# Patient Record
Sex: Female | Born: 1990 | Race: White | Hispanic: No | Marital: Married | Smoking: Former smoker
Health system: Southern US, Community
[De-identification: ages and names within clinical notes are randomized; demographics above are authoritative.]

## PROBLEM LIST (undated history)

## (undated) DIAGNOSIS — M359 Systemic involvement of connective tissue, unspecified: Secondary | ICD-10-CM

## (undated) DIAGNOSIS — F32A Depression, unspecified: Secondary | ICD-10-CM

## (undated) DIAGNOSIS — M1990 Unspecified osteoarthritis, unspecified site: Secondary | ICD-10-CM

## (undated) DIAGNOSIS — K529 Noninfective gastroenteritis and colitis, unspecified: Secondary | ICD-10-CM

## (undated) DIAGNOSIS — F329 Major depressive disorder, single episode, unspecified: Secondary | ICD-10-CM

## (undated) DIAGNOSIS — M797 Fibromyalgia: Secondary | ICD-10-CM

## (undated) HISTORY — DX: Noninfective gastroenteritis and colitis, unspecified: K52.9

## (undated) HISTORY — DX: Depression, unspecified: F32.A

## (undated) HISTORY — DX: Systemic involvement of connective tissue, unspecified: M35.9

## (undated) HISTORY — PX: OVARY SURGERY: SHX727

## (undated) HISTORY — DX: Fibromyalgia: M79.7

---
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
## 2011-12-25 LAB — HM PAP SMEAR: HM Pap smear: NEGATIVE

## 2012-05-25 ENCOUNTER — Ambulatory Visit: Payer: Self-pay | Admitting: Obstetrics & Gynecology

## 2012-05-25 LAB — CBC
HCT: 40.2 % (ref 35.0–47.0)
HGB: 13.2 g/dL (ref 12.0–16.0)
MCH: 28.7 pg (ref 26.0–34.0)
MCHC: 32.9 g/dL (ref 32.0–36.0)
MCV: 87 fL (ref 80–100)
Platelet: 302 10*3/uL (ref 150–440)
RBC: 4.61 10*6/uL (ref 3.80–5.20)
RDW: 12.3 % (ref 11.5–14.5)
WBC: 5.7 10*3/uL (ref 3.6–11.0)

## 2012-06-02 ENCOUNTER — Ambulatory Visit: Payer: Self-pay | Admitting: Obstetrics & Gynecology

## 2012-06-03 LAB — PATHOLOGY REPORT

## 2014-07-20 NOTE — Op Note (Signed)
PATIENT NAME:  Stephanie Ayala, Stephanie Ayala MR#:  [id number] DATE OF BIRTH:  11-06-1990  DATE OF PROCEDURE:  06/02/2012  PREOPERATIVE DIAGNOSIS: Pelvic pain, right lower quadrant pain, and right ovarian cyst.   POSTOPERATIVE DIAGNOSIS: Pelvic pain, right lower quadrant pain, and right ovarian cyst.   PROCEDURE: Operative laparoscopy, with right ovarian cystectomy.   SURGEON: Leticia Coletta.   ANESTHESIA: General.   BLOOD LOSS: Minimal.   COMPLICATIONS: None.   FINDINGS: Right small ovarian cyst, normal left ovary tubes, ovaries, uterus, appendix, bowel. No signs of endometriosis or adhesions.   DISPOSITION: To the recovery room in stable condition.   TECHNIQUE: The patient was prepped and draped in the usual sterile fashion after adequate anesthesia was obtained in the dorsal lithotomy position. Bladder was drained with a Robinson catheter. A sponge stick was placed per vagina for manipulation purposes.   Attention was then turned to the abdomen where a Veress needle was inserted through a 5-mm infraumbilical incision after Marcaine was used to anesthetize the skin. Veress needle placement is confirmed using the hanging-drop technique and the abdomen was then insufflated with CO2 gas. A 5-mm trocar was then inserted under direct visualization with the laparoscope, with no injuries or bleeding noted. The patient was placed in Trendelenburg position. The above-mentioned findings are visualized. Five millimeter trocars were placed in the suprapubic region in the left lower quadrant lateral to the inferior epigastric blood vessel, with no injuries or bleeding noted. The right ovary was isolated and cyst identified. It was lysed with the 5-mm Harmonic scalpel, with aspiration of fluid. The cyst wall was grasped with grasping forceps and a few pieces were removed without bleeding noted.   Pelvic cavity was irrigated, with aspiration of all fluid. Excellent hemostasis was noted. There was no apparent injury to bowel,  bladder, ureter or other structures. Gas was expelled as the patient was leveled. Trocars are removed, and Dermabond was used to close the skin. Sponge stick was removed. The patient was taken to the recovery room in stable condition. All sponge, instrument, and needle counts were correct.    ____________________________ R. Barnett Applebaum, MD rph:dm D: 06/02/2012 10:56:00 ET T: 06/02/2012 11:15:07 ET JOB#: [id number]  cc: Glean Salen, MD, <Dictator> Gae Dry MD ELECTRONICALLY SIGNED 06/03/2012 7:42

## 2015-03-20 ENCOUNTER — Encounter: Payer: Self-pay | Admitting: *Deleted

## 2015-03-20 ENCOUNTER — Ambulatory Visit
Admission: EM | Admit: 2015-03-20 | Discharge: 2015-03-20 | Disposition: A | Discharge status: Home / Self Care | Payer: 59 | Attending: Family Medicine | Admitting: Family Medicine

## 2015-03-20 DIAGNOSIS — J069 Acute upper respiratory infection, unspecified: Secondary | ICD-10-CM | POA: Diagnosis not present

## 2015-03-20 LAB — URINALYSIS COMPLETE WITH MICROSCOPIC (ARMC ONLY)
Bilirubin Urine: NEGATIVE
Glucose, UA: NEGATIVE mg/dL
Leukocytes, UA: NEGATIVE
Nitrite: NEGATIVE
Protein, ur: NEGATIVE mg/dL
Specific Gravity, Urine: 1.025 (ref 1.005–1.030)
pH: 5.5 (ref 5.0–8.0)

## 2015-03-20 LAB — PREGNANCY, URINE: Preg Test, Ur: NEGATIVE

## 2015-03-20 MED ORDER — AZITHROMYCIN 250 MG PO TABS: ORAL_TABLET | ORAL | Status: DC

## 2015-03-20 NOTE — ED Provider Notes (Addendum)
Patient presents today with symptoms of myalgias, nausea, subjective fever the last few days. She has had a mild headache as well. She denies any photophobia, neck stiffness, chest pain, shortness of breath. She has had minimal sore throat and mild productive cough. She denies any abdominal pain, vomiting, diarrhea, urinary symptoms.  ROS: Negative except mentioned above.  Vitals as per Epic  GENERAL: NAD HEENT: mild to moderate pharyngeal erythema, no exudate, no erythema of TMs, no cervical LAD RESP: CTA B CARD: RRR ABD: +BS, NT/ND, no guarding or rebound, no flank tenderness NEURO: CN II-XII grossly intact, negative meningeal symptoms   A/P: URI- discussed likely viral source, discussed with patient that if her sore throat and/or cough symptoms persist she can fill the Z-Pak and start taking it. Discussed UA results. Urine pregnancy negative. Encourage patient stay hydrated, rest. Tylenol when necessary. Encouraged seeking medical attention if symptoms persist or worsen as discussed.  Paulina Fusi, MD 03/20/15 2458  Paulina Fusi, MD 03/20/15 [phone]

## 2015-03-20 NOTE — ED Notes (Signed)
Patient reports the headache symptom started one week ago with body aches starting 3 days ago. Additional symptoms of fever and nausea without vomiting also reported. Patient has been working out a the gym for 6 months.

## 2015-03-26 ENCOUNTER — Ambulatory Visit
Admission: EM | Admit: 2015-03-26 | Discharge: 2015-03-26 | Disposition: A | Discharge status: Home / Self Care | Payer: 59 | Attending: Family Medicine | Admitting: Family Medicine

## 2015-03-26 DIAGNOSIS — R110 Nausea: Secondary | ICD-10-CM | POA: Diagnosis not present

## 2015-03-26 DIAGNOSIS — N390 Urinary tract infection, site not specified: Secondary | ICD-10-CM

## 2015-03-26 LAB — URINALYSIS COMPLETE WITH MICROSCOPIC (ARMC ONLY)
Glucose, UA: NEGATIVE mg/dL
Hgb urine dipstick: NEGATIVE
Leukocytes, UA: NEGATIVE
Nitrite: NEGATIVE
Protein, ur: NEGATIVE mg/dL
Specific Gravity, Urine: 1.02 (ref 1.005–1.030)
pH: 5.5 (ref 5.0–8.0)

## 2015-03-26 LAB — PREGNANCY, URINE: Preg Test, Ur: NEGATIVE

## 2015-03-26 MED ORDER — CIPROFLOXACIN HCL 500 MG PO TABS: 500.0000 mg | ORAL_TABLET | Freq: Two times a day (BID) | ORAL | Status: DC

## 2015-03-26 MED ORDER — ONDANSETRON HCL 4 MG PO TABS: 4.0000 mg | ORAL_TABLET | Freq: Four times a day (QID) | ORAL | Status: DC

## 2015-03-26 MED ORDER — ONDANSETRON 8 MG PO TBDP
8.0000 mg | ORAL_TABLET | Freq: Once | ORAL | Status: AC
  Administered 2015-03-26: 8 mg via ORAL

## 2015-03-26 NOTE — ED Notes (Signed)
Pt reports able to drink but feels nausea if eats , like the food is going to come back up. Pt reports dizziness on standing , unable to go to gym. Pt also reports headaches,at base of head. Has been taking tylenol and switching with advil. Also having diarrhea, since Wed. But not every day, same day she started zpac, but not having diarrhea today.

## 2015-03-26 NOTE — Discharge Instructions (Signed)
For fever or discomfort-- Tylenol 2 tablets or Ibuprofen/advil  2 tablets every 4-6 hours as needed  Bacteria in urine---Cipro 1 tablet twice daily for 7 days..call On day 3 for culture results  Zofran 4 mg 1  Up to three times daily for  Nausea  Avoid greasy and high spice foods--- try BRAT   bananas rice applesauce toast     Fluids are more important         Viral syndrome couch -- Zpak works for [employer] tract for about 42 days  Nausea and Vomiting Nausea is a sick feeling that often comes before throwing up (vomiting). Vomiting is a reflex where stomach contents come out of your mouth. Vomiting can cause severe loss of body fluids (dehydration). Children and elderly adults can become dehydrated quickly, especially if they also have diarrhea. Nausea and vomiting are symptoms of a condition or disease. It is important to find the cause of your symptoms. CAUSES   Direct irritation of the stomach lining. This irritation can result from increased acid production (gastroesophageal reflux disease), infection, food poisoning, taking certain medicines (such as nonsteroidal anti-inflammatory drugs), alcohol use, or tobacco use.  Signals from the brain.These signals could be caused by a headache, heat exposure, an inner ear disturbance, increased pressure in the brain from injury, infection, a tumor, or a concussion, pain, emotional stimulus, or metabolic problems.  An obstruction in the gastrointestinal tract (bowel obstruction).  Illnesses such as diabetes, hepatitis, gallbladder problems, appendicitis, kidney problems, cancer, sepsis, atypical symptoms of a heart attack, or eating disorders.  Medical treatments such as chemotherapy and radiation.  Receiving medicine that makes you sleep (general anesthetic) during surgery. DIAGNOSIS Your caregiver may ask for tests to be done if the problems do not improve after a few days. Tests may also be done if symptoms are severe or if the  reason for the nausea and vomiting is not clear. Tests may include:  Urine tests.  Blood tests.  Stool tests.  Cultures (to look for evidence of infection).  X-rays or other imaging studies. Test results can help your caregiver make decisions about treatment or the need for additional tests. TREATMENT You need to stay well hydrated. Drink frequently but in small amounts.You may wish to drink water, sports drinks, clear broth, or eat frozen ice pops or gelatin dessert to help stay hydrated.When you eat, eating slowly may help prevent nausea.There are also some antinausea medicines that may help prevent nausea. HOME CARE INSTRUCTIONS   Take all medicine as directed by your caregiver.  If you do not have an appetite, do not force yourself to eat. However, you must continue to drink fluids.  If you have an appetite, eat a normal diet unless your caregiver tells you differently.  Eat a variety of complex carbohydrates (rice, wheat, potatoes, bread), lean meats, yogurt, fruits, and vegetables.  Avoid high-fat foods because they are more difficult to digest.  Drink enough water and fluids to keep your urine clear or pale yellow.  If you are dehydrated, ask your caregiver for specific rehydration instructions. Signs of dehydration may include:  Severe thirst.  Dry lips and mouth.  Dizziness.  Dark urine.  Decreasing urine frequency and amount.  Confusion.  Rapid breathing or pulse. SEEK IMMEDIATE MEDICAL CARE IF:   You have blood or brown flecks (like coffee grounds) in your vomit.  You have black or bloody stools.  You have a severe headache or stiff neck.  You are confused.  You have severe  abdominal pain.  You have chest pain or trouble breathing.  You do not urinate at least once every 8 hours.  You develop cold or clammy skin.  You continue to vomit for longer than 24 to 48 hours.  You have a fever. MAKE SURE YOU:   Understand these  instructions.  Will watch your condition.  Will get help right away if you are not doing well or get worse.   This information is not intended to replace advice given to you by your health care provider. Make sure you discuss any questions you have with your health care provider.   Document Released: 03/16/2005 Document Revised: 06/08/2011 Document Reviewed: 08/13/2010 Elsevier Interactive Patient Education [employer].

## 2015-03-26 NOTE — ED Notes (Signed)
Denies fever or headache on assessment

## 2015-03-26 NOTE — ED Notes (Addendum)
C/o posterior neck pain/"headache" x 3-4 weeks. Also states has a fever "every night" no higher than 100.3 every night. + nausea when atempts to eat. Seen here at [facility] this past Wednesday and told "don't know what's wrong"

## 2015-03-26 NOTE — ED Provider Notes (Signed)
CSN: [id number]     Arrival date & time 03/26/15  1146 History   First MD Initiated Contact with Patient 03/26/15 1500     Chief Complaint  Patient presents with  . Fever   (Consider location/radiation/quality/duration/timing/severity/associated sxs/prior Treatment) HPI   23 yo F presents concerned because she had a 100 degree temp in the evening. She Reports her usual temp around 97. Stayed out of work 2 days last week, then seen here 12/21. 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 She opted to start Zpak on 12/22- has completed Reports mild nausea, but tolerates food - choices today were to eat potato soup and potato chips. Well tolerated. Has not vomited. No fever . Denies frequency or dysuria Has intermittent occipital headache No hx of seasonal allergies Wearing Mirena - no menses. Hx of ovarian cysts-has had surgery x 2 by her report  History reviewed. No pertinent past medical history. Past Surgical History  Procedure Laterality Date  . Ovary surgery Bilateral     cyst removal   Family History  Problem Relation Age of Onset  . Diabetes Father    Social History  Substance Use Topics  . Smoking status: Current Every Day Smoker -- 0.50 packs/day    Types: Cigarettes  . Smokeless tobacco: Never Used  . Alcohol Use: Yes   OB History    No data available    Hx of ovarian cysts required surgery - managed on Mirena   Review of Systems 10 Systems reviewed and are negative for acute change except as noted in the HPI.  Allergies  Review of patient's allergies indicates no known allergies.  Home Medications   Prior to Admission medications   Medication Sig Start Date End Date Taking? Authorizing Provider  levonorgestrel (MIRENA) 20 MCG/24HR IUD 1 each by Intrauterine route once.   Yes Historical Provider,  MD  azithromycin (ZITHROMAX Z-PAK) 250 MG tablet Use as directed on box. 03/20/15   Paulina Fusi, MD  ciprofloxacin (CIPRO) 500 MG tablet Take 1 tablet (500 mg total) by mouth 2 (two) times daily. 03/26/15   Jan Fireman, PA-C  Multiple Vitamins-Minerals (MULTIVITAMIN WITH MINERALS) tablet Take 1 tablet by mouth daily.    Historical Provider, MD  ondansetron (ZOFRAN) 4 MG tablet Take 1 tablet (4 mg total) by mouth every 6 (six) hours. 03/26/15   Jan Fireman, PA-C   Meds Ordered and Administered this Visit   Medications  ondansetron (ZOFRAN-ODT) disintegrating tablet 8 mg (8 mg Oral Given 03/26/15 1512)    Temp(Src) 97.5 F (36.4 C) (Tympanic) No data found.   Physical Exam   Temp 97.5  96-16-117/64  Sp02 100% General: NAD, does not appear toxic- alert and interactive, denies any pain, skin well hydrated  HEENT:normocephalic,atraumatic, mucous membranes moist,grossly normal hearing Eyes: EOMI, conjunctiva clear, conjugate gaze Neck: supple,no lymphadenopathy Resp : CT A, bilat; normal respiratory effort Card : RRR Abd:  Not distended, soft , normal BS, non-tender Back : no CVAT, no back pain Skin: no rash, skin intact MSK: no deformities, ambulatory without assistance Neuro : good attention,recall-good memory, no focal neuro deficits Psych: speech and behavior appropriate   ED Course  Procedures (including critical care time)  Labs Review Labs Reviewed  URINALYSIS COMPLETEWITH MICROSCOPIC ([facility] ONLY) - Abnormal; Notable for the following:  Bilirubin Urine 1+ (*)    Ketones, ur TRACE (*)    Bacteria, UA MANY (*)    Squamous Epithelial / LPF 0-5 (*)    All other components within normal limits  PREGNANCY, URINE   Results for orders placed or performed during the hospital encounter of 03/26/15  Urinalysis complete, with microscopic  Result Value Ref Range   Color, Urine YELLOW YELLOW   APPearance CLEAR CLEAR   Glucose, UA NEGATIVE NEGATIVE mg/dL   Bilirubin Urine  1+ (A) NEGATIVE   Ketones, ur TRACE (A) NEGATIVE mg/dL   Specific Gravity, Urine 1.020 1.005 - 1.030   Hgb urine dipstick NEGATIVE NEGATIVE   pH 5.5 5.0 - 8.0   Protein, ur NEGATIVE NEGATIVE mg/dL   Nitrite NEGATIVE NEGATIVE   Leukocytes, UA NEGATIVE NEGATIVE   RBC / HPF 0-5 0 - 5 RBC/hpf   WBC, UA 0-5 0 - 5 WBC/hpf   Bacteria, UA MANY (A) NONE SEEN   Squamous Epithelial / LPF 0-5 (A) NONE SEEN   Mucous PRESENT   Pregnancy, urine  Result Value Ref Range   Preg Test, Ur NEGATIVE NEGATIVE     Imaging Review No results found.  Was given  Zofran 8 mg on arrival-- denied discomfort. Discussed long term effectiveness of Zpak despite short therapy time   MDM   1. UTI (lower urinary tract infection)   2. Nausea    Plan: Test/x-ray results and diagnosis reviewed with patient- viral syndrome also to be considered UTI vs contaminated specimen - will Rx - missed work, mild temp elevation, trace ketones and nausea; preg test neg Call 3 days for culture results- Cipro x 7 days Encourage PO fluids- tylenol/ibuprofen-  Given Zofran 4 mg- trace ketones noted- encourage PO liquids Rx as per orders;  benefits, risks, potential side effects reviewed   Recommend supportive treatment  Questions fielded, expectations and recommendations reviewed.Discussed follow up and return parameters including no resolution or any worsening condition. . Patient expresses understanding  and agrees to plan. Will return to [facility] with questions, concerns or exacerbation.   Discharge Medication List as of 03/26/2015  4:28 PM    START taking these medications   Details  ciprofloxacin (CIPRO) 500 MG tablet Take 1 tablet (500 mg total) by mouth 2 (two) times daily., Starting 03/26/2015, Until Discontinued, Normal    ondansetron (ZOFRAN) 4 MG tablet Take 1 tablet (4 mg total) by mouth every 6 (six) hours., Starting 03/26/2015, Until Discontinued, Normal        Jan Fireman, PA-C 03/28/15 1532

## 2015-03-26 NOTE — ED Notes (Signed)
Pt given gingerale and graham crackers to try eating after zofran.

## 2015-03-28 ENCOUNTER — Encounter: Payer: Self-pay | Admitting: Physician Assistant

## 2015-03-29 ENCOUNTER — Emergency Department
Admission: EM | Admit: 2015-03-29 | Discharge: 2015-03-29 | Disposition: A | Discharge status: Home / Self Care | Payer: 59 | Attending: Emergency Medicine | Admitting: Emergency Medicine

## 2015-03-29 ENCOUNTER — Encounter: Payer: Self-pay | Admitting: Medical Oncology

## 2015-03-29 DIAGNOSIS — F17210 Nicotine dependence, cigarettes, uncomplicated: Secondary | ICD-10-CM | POA: Diagnosis not present

## 2015-03-29 DIAGNOSIS — B2790 Infectious mononucleosis, unspecified without complication: Secondary | ICD-10-CM | POA: Insufficient documentation

## 2015-03-29 DIAGNOSIS — Z79899 Other long term (current) drug therapy: Secondary | ICD-10-CM | POA: Diagnosis not present

## 2015-03-29 DIAGNOSIS — R509 Fever, unspecified: Secondary | ICD-10-CM | POA: Diagnosis present

## 2015-03-29 LAB — COMPREHENSIVE METABOLIC PANEL
ALT: 116 U/L — ABNORMAL HIGH (ref 14–54)
AST: 134 U/L — ABNORMAL HIGH (ref 15–41)
Albumin: 3.5 g/dL (ref 3.5–5.0)
Alkaline Phosphatase: 86 U/L (ref 38–126)
Anion gap: 6 (ref 5–15)
BUN: 7 mg/dL (ref 6–20)
CO2: 26 mmol/L (ref 22–32)
Calcium: 8.6 mg/dL — ABNORMAL LOW (ref 8.9–10.3)
Chloride: 103 mmol/L (ref 101–111)
Creatinine, Ser: 0.65 mg/dL (ref 0.44–1.00)
GFR calc Af Amer: >60 mL/min (ref >60)
GFR calc non Af Amer: >60 mL/min (ref >60)
Glucose, Bld: 94 mg/dL (ref 65–99)
Potassium: 3.9 mmol/L (ref 3.5–5.1)
Sodium: 135 mmol/L (ref 135–145)
Total Bilirubin: 1.5 mg/dL — ABNORMAL HIGH (ref 0.3–1.2)
Total Protein: 7.1 g/dL (ref 6.5–8.1)

## 2015-03-29 LAB — CBC
HCT: 36.7 % (ref 35.0–47.0)
Hemoglobin: 12.4 g/dL (ref 12.0–16.0)
MCH: 29 pg (ref 26.0–34.0)
MCHC: 33.7 g/dL (ref 32.0–36.0)
MCV: 86.1 fL (ref 80.0–100.0)
Platelets: 174 K/uL (ref 150–440)
RBC: 4.26 MIL/uL (ref 3.80–5.20)
RDW: 14.3 % (ref 11.5–14.5)
WBC: 10.4 K/uL (ref 3.6–11.0)

## 2015-03-29 LAB — MONONUCLEOSIS SCREEN: Mono Screen: POSITIVE — AB

## 2015-03-29 MED ORDER — SODIUM CHLORIDE 0.9 % IV BOLUS (SEPSIS)
1000.0000 mL | Freq: Once | INTRAVENOUS | Status: AC
  Administered 2015-03-29: 1000 mL via INTRAVENOUS

## 2015-03-29 MED ORDER — ONDANSETRON HCL 4 MG/2ML IJ SOLN
4.0000 mg | Freq: Once | INTRAMUSCULAR | Status: AC
  Administered 2015-03-29: 4 mg via INTRAVENOUS
  Filled 2015-03-29: qty 2

## 2015-03-29 MED ORDER — ONDANSETRON HCL 4 MG PO TABS: 4.0000 mg | ORAL_TABLET | Freq: Every day | ORAL | Status: AC | PRN

## 2015-03-29 NOTE — Discharge Instructions (Signed)
Infectious Mononucleosis Infectious mononucleosis is an infection caused by a virus. This illness is often called "mono." It causes symptoms that affect various areas of the body, including the throat, upper air passages, and lymph glands. The liver or spleen may also be affected. The virus spreads from person to person through close contact. The illness is usually not serious and often goes away in 2-4 weeks without treatment. In rare cases, symptoms can be more severe and last longer, sometimes up to several months. Because the illness can sometimes cause the liver or spleen to become enlarged, you should not participate in contact sports or strenuous exercise until your health care provider approves. CAUSES  Infectious mononucleosis is caused by the Epstein-Barr virus. This virus spreads through contact with an infected person's saliva or other bodily fluids. It is often spread through kissing. It may also spread through coughing or sharing utensils or drinking glasses that were recently used by an infected person. An infected person will not always appear ill but can still spread the virus. RISK FACTORS This illness is most common in adolescents and young adults. SIGNS AND SYMPTOMS  The most common symptoms of infectious mononucleosis are:  Sore throat.   Headache.   Fatigue.   Muscle aches.   Swollen glands.   Fever.   Poor appetite.   Enlarged liver or spleen.  Some less common symptoms that can also occur include:  Rash. This is more common if you take antibiotic medicines.  Feeling sick to your stomach (nauseous).   Abdominal pain.  DIAGNOSIS  Your health care provider will take your medical history and do a physical exam. Blood tests can be done to confirm the diagnosis.  TREATMENT  Infectious mononucleosis usually goes away on its own with time. It cannot be cured with medicines, but medicines are sometimes used to relieve symptoms. Steroid medicine is sometimes  needed if the swelling in the throat causes breathing or swallowing problems. Treatment in a hospital is sometimes needed for severe cases.  HOME CARE INSTRUCTIONS   Rest as needed.   Do not participate in contact sports, strenuous exercise, or heavy lifting until your health care provider approves. The liver and spleen could be seriously injured if they are enlarged from the illness. You may need to wait a couple months before participating in sports.   Drink enough fluid to keep your urine clear or pale yellow.   Do not drink alcohol.  Take medicines only as directed by your health care provider. Children under 56 years of age should not take aspirin because of the association with Reye syndrome.   Eat soft foods. Cold foods such as ice cream or frozen ice pops can soothe a sore throat.  If you have a sore throat, gargle with a mixture of salt and water. This may help relieve your discomfort. Mix 1 tsp of salt in 1 cup of warm water. Sucking on hard candy may also help.   Start regular activities gradually after the fever is gone. Be sure to rest when tired.   Avoid kissing or sharing utensils or drinking glasses until your health care provider tells you that you are no longer contagious.  PREVENTION  To avoid spreading the virus, do not kiss anyone or share utensils, drinking glasses, or food until your health care provider tells you that you are no longer contagious. SEEK MEDICAL CARE IF:   Your fever is not gone after 10 days.  You have swollen lymph nodes that are not  back to normal after 4 weeks.  Your activity level is not back to normal after 2 months.   You have yellow coloring to your eyes and skin (jaundice).  You have constipation.  SEEK IMMEDIATE MEDICAL CARE IF:   You have severe pain in the abdomen or shoulder.  You are drooling.  You have trouble swallowing.  You have trouble breathing.  You develop a stiff neck.  You develop a severe  headache.  You cannot stop throwing up (vomiting).  You have convulsions.  You are confused.  You have trouble with balance.  You have signs of dehydration. These may include:  Weakness.  Sunken eyes.  Pale skin.  Dry mouth.  Rapid breathing or pulse.   This information is not intended to replace advice given to you by your health care provider. Make sure you discuss any questions you have with your health care provider.   Document Released: 03/13/2000 Document Revised: 04/06/2014 Document Reviewed: 11/21/2013 Elsevier Interactive Patient Education [employer].

## 2015-03-29 NOTE — ED Notes (Addendum)
Pt reports that she has been having cold sx's with nausea and vomiting x 3 weeks, pt reports headache x 2 weeks. Pt was seen at [facility] urgent care but nausea meds not helping. Pt has not vomited in 24 hrs. Reports headache continues to cause nausea.

## 2015-03-29 NOTE — ED Provider Notes (Addendum)
[facility] Emergency Department Provider Note  Time seen: 4:13 PM  I have reviewed the triage vital signs and the nursing notes.   HISTORY  Chief Complaint Emesis; Fever; and Headache    HPI DESTINEY Ayala is a 24 y.o. female with no past medical history who presents the emergency department with 3 weeks of intermittent headaches, and dizziness, low-grade fevers, nausea. Initially patient also stated mild sore throat with mild cough per urgent care note. Patient was seen in the urgent care 12/21 for the same symptoms, then once again on 12/26, and once again today in the emergency department. Initially on 12/21 the patient was prescribed a Z-Pak and diagnosed with an upper respiratory infection. On 12/26 patient continued to have low-grade fevers per patient and had missed worked once again, so she went to the urgent care. At that time she was given Zofran told to increase her fluids and take over-the-counter Tylenol or Motrin. Today the patient states nothing has really changed she continues to feel generalized weakness, continues with nausea (patient is eating in the emergency department currently), continue with low-grade fevers which she describes as 99-100.Patient denies any recent cough, congestion, vomiting, diarrhea, constipation, abdominal pain, chest pain, sore throat. She did state earlier on in the illness she was having diffuse muscle aches.   History reviewed. No pertinent past medical history.  There are no active problems to display for this patient.   Past Surgical History  Procedure Laterality Date  . Ovary surgery Bilateral     cyst removal    Current Outpatient Rx  Name  Route  Sig  Dispense  Refill  . ciprofloxacin (CIPRO) 500 MG tablet   Oral   Take 1 tablet (500 mg total) by mouth 2 (two) times daily. Patient taking differently: Take 500 mg by mouth 2 (two) times daily. For 7 days.   14 tablet   0   . Multiple Vitamins-Minerals  (MULTIVITAMIN WITH MINERALS) tablet   Oral   Take 1 tablet by mouth daily.         . ondansetron (ZOFRAN) 4 MG tablet   Oral   Take 1 tablet (4 mg total) by mouth every 6 (six) hours.   12 tablet   0   . azithromycin (ZITHROMAX Z-PAK) 250 MG tablet      Use as directed on box.   6 each   0     Allergies Review of patient's allergies indicates no known allergies.  Family History  Problem Relation Age of Onset  . Diabetes Father     Social History Social History  Substance Use Topics  . Smoking status: Current Every Day Smoker -- 0.50 packs/day    Types: Cigarettes  . Smokeless tobacco: Never Used  . Alcohol Use: Yes    Review of Systems Constitutional: Positive for low-grade fevers Cardiovascular: Negative for chest pain. Respiratory: Negative for shortness of breath. Gastrointestinal: Negative for abdominal pain. Positive for nausea.  Genitourinary: Negative for dysuria. Neurological: As it for intermittent occipital headaches. Denies focal weakness or numbness 10-point ROS otherwise negative.  ____________________________________________   PHYSICAL EXAM:  VITAL SIGNS: ED Triage Vitals  Enc Vitals Group     BP 03/29/15 1447 128/72 mmHg     Pulse Rate 03/29/15 1447 95     Resp 03/29/15 1447 18     Temp 03/29/15 1447 98.1 F (36.7 C)     Temp Source 03/29/15 1447 Oral     SpO2 03/29/15 1447 100 %  Weight 03/29/15 1447 147 lb (66.679 kg)     Height 03/29/15 1447 5' 3"  (1.6 m)     Head Cir --      Peak Flow --      Pain Score 03/29/15 1447 5     Pain Loc --      Pain Edu? --      Excl. in [place]? --     Constitutional: Alert and oriented. Well appearing and in no distress. very well-appearing currently eating soup in her bed.  Eyes: Normal exam ENT   Head: Normocephalic and atraumatic.   Mouth/Throat: Mucous membranes are moist. no pharyngeal erythema. Normal tympanic membranes.  Cardiovascular: Normal rate, regular rhythm. No  murmur Respiratory: Normal respiratory effort without tachypnea nor retractions. Breath sounds are clear and equal bilaterally. No wheezes/rales/rhonchi. Gastrointestinal: Soft and nontender. No distention.   Musculoskeletal: Nontender with normal range of motion in all extremities. No lower extremity tenderness or edema. Neurologic:  Normal speech and language. No gross focal neurologic deficits Skin:  Skin is warm, dry and intact.  Psychiatric: Mood and affect are normal. Speech and behavior are normal  ____________________________________________     INITIAL IMPRESSION / ASSESSMENT AND PLAN / ED COURSE  Pertinent labs & imaging results that were available during my care of the patient were reviewed by me and considered in my medical decision making (see chart for details).  Patient presents for 3 weeks of intermittent headaches, nausea, low-grade fevers. Does state initially she was having some cough, muscle aches, but those have resolved some time ago. Patient states her nausea is much better after receiving Zofran 4 days ago at the urgent care. Patient currently eating soup in the emergency department. Given the duration of symptoms, we will check labs, IV hydrate, treat nausea, and closing monitor in the emergency department. Patient agreeable to plan. Patient appears very well in the emergency department, with normal vitals.  Labs have resulted largely within normal limits besides an elevated liver enzymes, and a positive Mono nucleus's screen. Mono explains the patient's symptoms, prolonged illness. We will refill the patient Zofran prescription, supportive care at home with a systolic with her primary care physician.  Patient unable to produce a urine sample in the emergency department. Had a negative urinalysis and negative preg test 4 days ago in the urgent care. We will discharge home. ____________________________________________   FINAL CLINICAL IMPRESSION(S) / ED  DIAGNOSES  Generalized weakness Mononucleosis Headache Nausea  Harvest Dark, MD 03/29/15 1752  Harvest Dark, MD 03/29/15 [place], MD 03/29/15 [phone]

## 2017-02-12 ENCOUNTER — Encounter: Payer: Self-pay | Admitting: Obstetrics & Gynecology

## 2017-02-12 ENCOUNTER — Telehealth: Payer: Self-pay | Admitting: Obstetrics & Gynecology

## 2017-02-12 ENCOUNTER — Ambulatory Visit (INDEPENDENT_AMBULATORY_CARE_PROVIDER_SITE_OTHER): Payer: BLUE CROSS/BLUE SHIELD | Admitting: Obstetrics & Gynecology

## 2017-02-12 VITALS — BP 120/80 | HR 91 | Ht 62.5 in | Wt 175.0 lb

## 2017-02-12 DIAGNOSIS — Z1329 Encounter for screening for other suspected endocrine disorder: Secondary | ICD-10-CM

## 2017-02-12 DIAGNOSIS — Z13220 Encounter for screening for lipoid disorders: Secondary | ICD-10-CM

## 2017-02-12 DIAGNOSIS — Z131 Encounter for screening for diabetes mellitus: Secondary | ICD-10-CM | POA: Diagnosis not present

## 2017-02-12 DIAGNOSIS — Z124 Encounter for screening for malignant neoplasm of cervix: Secondary | ICD-10-CM | POA: Diagnosis not present

## 2017-02-12 DIAGNOSIS — Z01419 Encounter for gynecological examination (general) (routine) without abnormal findings: Secondary | ICD-10-CM | POA: Diagnosis not present

## 2017-02-12 DIAGNOSIS — Z0000 Encounter for general adult medical examination without abnormal findings: Secondary | ICD-10-CM

## 2017-02-12 NOTE — Telephone Encounter (Signed)
12/20 at 3:10 pt coming for mirena removal/insert with rph

## 2017-02-12 NOTE — Progress Notes (Signed)
HPI:      Ms. Stephanie Ayala is a 26 y.o. G1P0010 who LMP was No LMP recorded. Patient is not currently having periods (Reason: IUD)., she presents today for her annual examination. The patient has no complaints today. The patient is sexually active. Her last pap: approximate date 2016 and was normal. The patient does perform self breast exams.  There is no notable family history of breast or ovarian cancer in her family.  The patient has regular exercise: yes.  The patient denies current symptoms of depression.    GYN History: Contraception: IUD for almost 5 years  No perriod or BTB  PMHx: History reviewed. No pertinent past medical history. Past Surgical History:  Procedure Laterality Date  . OVARY SURGERY Bilateral    cyst removal   Family History  Problem Relation Age of Onset  . Diabetes Father    Social History   Tobacco Use  . Smoking status: Current Every Day Smoker    Packs/day: 0.50    Types: Cigarettes  . Smokeless tobacco: Never Used  Substance Use Topics  . Alcohol use: Yes  . Drug use: No    Current Outpatient Medications:  .  citalopram (CELEXA) 40 MG tablet, Take by mouth., Disp: , Rfl:  .  Diclofenac Sodium CR 100 MG 24 hr tablet, Take by mouth., Disp: , Rfl:  .  levonorgestrel (MIRENA) 20 MCG/24HR IUD, 1 each once by Intrauterine route., Disp: , Rfl:  .  azithromycin (ZITHROMAX Z-PAK) 250 MG tablet, Use as directed on box. (Patient not taking: Reported on 02/12/2017), Disp: 6 each, Rfl: 0 .  ciprofloxacin (CIPRO) 500 MG tablet, Take 1 tablet (500 mg total) by mouth 2 (two) times daily. (Patient not taking: Reported on 02/12/2017), Disp: 14 tablet, Rfl: 0 .  Multiple Vitamins-Minerals (MULTIVITAMIN WITH MINERALS) tablet, Take 1 tablet by mouth daily., Disp: , Rfl:  Allergies: Patient has no known allergies.  Review of Systems  Constitutional: Negative for chills, fever and malaise/fatigue.  HENT: Negative for congestion, sinus pain and sore throat.     Eyes: Negative for blurred vision and pain.  Respiratory: Negative for cough and wheezing.   Cardiovascular: Negative for chest pain and leg swelling.  Gastrointestinal: Negative for abdominal pain, constipation, diarrhea, heartburn, nausea and vomiting.  Genitourinary: Negative for dysuria, frequency, hematuria and urgency.  Musculoskeletal: Negative for back pain, joint pain, myalgias and neck pain.  Skin: Negative for itching and rash.  Neurological: Negative for dizziness, tremors and weakness.  Endo/Heme/Allergies: Does not bruise/bleed easily.  Psychiatric/Behavioral: Negative for depression. The patient is not nervous/anxious and does not have insomnia.     Objective: BP 120/80   Pulse 91   Ht 5' 2.5" (1.588 m)   Wt 175 lb (79.4 kg)   BMI 31.50 kg/m   Filed Weights   02/12/17 0809  Weight: 175 lb (79.4 kg)   Body mass index is 31.5 kg/m. Physical Exam  Constitutional: She is oriented to person, place, and time. She appears well-developed and well-nourished. No distress.  Genitourinary: Rectum normal, vagina normal and uterus normal. Pelvic exam was performed with patient supine. There is no rash or lesion on the right labia. There is no rash or lesion on the left labia. Vagina exhibits no lesion. No bleeding in the vagina. Right adnexum does not display mass and does not display tenderness. Left adnexum does not display mass and does not display tenderness. Cervix does not exhibit motion tenderness, lesion, friability or polyp.   Uterus  is mobile and midaxial. Uterus is not enlarged or exhibiting a mass.  HENT:  Head: Normocephalic and atraumatic. Head is without laceration.  Right Ear: Hearing normal.  Left Ear: Hearing normal.  Nose: No epistaxis.  No foreign bodies.  Mouth/Throat: Uvula is midline, oropharynx is clear and moist and mucous membranes are normal.  Eyes: Pupils are equal, round, and reactive to light.  Neck: Normal range of motion. Neck supple. No  thyromegaly present.  Cardiovascular: Normal rate and regular rhythm. Exam reveals no gallop and no friction rub.  No murmur heard. Pulmonary/Chest: Effort normal and breath sounds normal. No respiratory distress. She has no wheezes. Right breast exhibits no mass, no skin change and no tenderness. Left breast exhibits no mass, no skin change and no tenderness.  Abdominal: Soft. Bowel sounds are normal. She exhibits no distension. There is no tenderness. There is no rebound.  Musculoskeletal: Normal range of motion.  Neurological: She is alert and oriented to person, place, and time. No cranial nerve deficit.  Skin: Skin is warm and dry.  Psychiatric: She has a normal mood and affect. Judgment normal.  Vitals reviewed.  Assessment:  ANNUAL EXAM 1. Annual physical exam   2. Screening for cervical cancer   3. Screening for diabetes mellitus   4. Screening for cholesterol level   5. Screening for thyroid disorder    Screening Plan:            1.  Cervical Screening-  Pap smear done today  2. Breast screening- Exam annually and mammogram>40 planned   3. Colonoscopy every 10 years, Hemoccult testing - after age 25  4. Labs Ordered today  5. Counseling for contraception: IUD- need for IUD exchange soon due to Mirena in for almost 5 years (in Dec).  Other:  1. Annual physical exam  2. Screening for cervical cancer - IGP, rfx Aptima HPV ASCU  3. Screening for diabetes mellitus - Glucose, fasting  4. Screening for cholesterol level - Lipid panel  5. Screening for thyroid disorder - TSH    F/U  Return in about 3 weeks (around 03/05/2017) for Follow up for [place] IUD.  Barnett Applebaum, MD, [facility], [facility] 02/12/2017  8:43 AM

## 2017-02-12 NOTE — Patient Instructions (Signed)

## 2017-02-13 LAB — LIPID PANEL
Chol/HDL Ratio: 3.8 ratio (ref 0.0–4.4)
Cholesterol, Total: 204 mg/dL — ABNORMAL HIGH (ref 100–199)
HDL: 53 mg/dL (ref >39)
LDL Calculated: 138 mg/dL — ABNORMAL HIGH (ref 0–99)
Triglycerides: 67 mg/dL (ref 0–149)
VLDL Cholesterol Cal: 13 mg/dL (ref 5–40)

## 2017-02-13 LAB — GLUCOSE, FASTING: Glucose, Plasma: 95 mg/dL (ref 65–99)

## 2017-02-13 LAB — TSH: TSH: 3.04 u[IU]/mL (ref 0.450–4.500)

## 2017-02-15 ENCOUNTER — Encounter: Payer: Self-pay | Admitting: Obstetrics & Gynecology

## 2017-02-17 LAB — IGP, RFX APTIMA HPV ASCU: PAP Smear Comment: 0

## 2017-02-20 NOTE — Progress Notes (Signed)
NA.  Needs colpo for LGSIL PAP.  Needs counseling as to elevated cholesterol (mild).  Rec diet changes and recheck next year.  Consider Omega3.

## 2017-02-22 NOTE — Progress Notes (Signed)
Please schedule Colpo soon, OK to overbook as needs to be done >1 week before her IUD appt in mid Dec.  Thx.

## 2017-03-08 ENCOUNTER — Ambulatory Visit: Payer: BLUE CROSS/BLUE SHIELD | Admitting: Obstetrics and Gynecology

## 2017-03-18 ENCOUNTER — Ambulatory Visit: Payer: BLUE CROSS/BLUE SHIELD | Admitting: Obstetrics & Gynecology

## 2017-04-09 ENCOUNTER — Ambulatory Visit (INDEPENDENT_AMBULATORY_CARE_PROVIDER_SITE_OTHER): Payer: BLUE CROSS/BLUE SHIELD | Admitting: Obstetrics & Gynecology

## 2017-04-09 ENCOUNTER — Encounter: Payer: Self-pay | Admitting: Obstetrics & Gynecology

## 2017-04-09 VITALS — BP 120/80 | HR 99 | Ht 62.0 in | Wt 179.0 lb

## 2017-04-09 DIAGNOSIS — R87612 Low grade squamous intraepithelial lesion on cytologic smear of cervix (LGSIL): Secondary | ICD-10-CM

## 2017-04-09 NOTE — Patient Instructions (Signed)

## 2017-04-09 NOTE — Progress Notes (Signed)
Referring Provider:  Jola Babinski  HPI:  Stephanie Ayala is a 27 y.o.  G1P0010  who presents today for evaluation and management of abnormal cervical cytology.    Dysplasia History:  LGSIL by recent PAP  ROS:  Pertinent items are noted in HPI.  OB History  Gravida Para Term Preterm AB Living  1       1    SAB TAB Ectopic Multiple Live Births  1            # Outcome Date GA Lbr Len/2nd Weight Sex Delivery Anes PTL Lv  1 SAB               No past medical history on file.  Past Surgical History:  Procedure Laterality Date  . OVARY SURGERY Bilateral    cyst removal    SOCIAL HISTORY: Social History   Substance and Sexual Activity  Alcohol Use Yes   Social History   Substance and Sexual Activity  Drug Use No     Family History  Problem Relation Age of Onset  . Diabetes Father     ALLERGIES:  Patient has no known allergies.  Current Outpatient Medications on File Prior to Visit  Medication Sig Dispense Refill  . citalopram (CELEXA) 40 MG tablet Take by mouth.    . Diclofenac Sodium CR 100 MG 24 hr tablet Take by mouth.    . levonorgestrel (MIRENA) 20 MCG/24HR IUD 1 each once by Intrauterine route.    . mesalamine (CANASA) 1000 MG suppository Place rectally.    . mesalamine (LIALDA) 1.2 g EC tablet TAKE 4 TABLETS BY MOUTH EVERY DAY WITH BREAKFAST  3  . Multiple Vitamins-Minerals (MULTIVITAMIN WITH MINERALS) tablet Take 1 tablet by mouth daily.    Marland Kitchen azithromycin (ZITHROMAX Z-PAK) 250 MG tablet Use as directed on box. (Patient not taking: Reported on 02/12/2017) 6 each 0  . ciprofloxacin (CIPRO) 500 MG tablet Take 1 tablet (500 mg total) by mouth 2 (two) times daily. (Patient not taking: Reported on 02/12/2017) 14 tablet 0   Physical Exam: -Vitals:  BP 120/80   Pulse 99   Ht 5' 2"  (1.575 m)   Wt 179 lb (81.2 kg)   BMI 32.74 kg/m  GEN: WD, WN, NAD.  A+ O x 3, good mood and affect. ABD:  NT, ND.  Soft, no masses.  No hernias noted.   Pelvic:   Vulva: Normal  appearance.  No lesions.  Vagina: No lesions or abnormalities noted.  Support: Normal pelvic support.  Urethra No masses tenderness or scarring.  Meatus Normal size without lesions or prolapse.  Cervix: See below.  Anus: Normal exam.  No lesions.  Perineum: Normal exam.  No lesions.        Bimanual   Uterus: Normal size.  Non-tender.  Mobile.  AV.  Adnexae: No masses.  Non-tender to palpation.  Cul-de-sac: Negative for abnormality.   PROCEDURE: 1.  Urine Pregnancy Test:  not done 2.  Colposcopy performed with 4% acetic acid after verbal consent obtained                                         -Aceto-white Lesions Location(s): 6, 12 o'clock.              -Biopsy performed at 6,12 o'clock               -ECC indicated  and performed: Yes.       -Biopsy sites made hemostatic with pressure, AgNO3, and/or Monsel's solution   -Satisfactory colposcopy: Yes.      -Evidence of Invasive cervical CA :  NO Physical Exam  Genitourinary:      ASSESSMENT:  Stephanie Ayala is a 55 y.o. G1P0010 here for  1. Low grade squamous intraepith lesion on cytologic smear cervix (lgsil)   .  PLAN: 1.  I discussed the grading system of pap smears and HPV high risk viral types.  We will discuss and base management after colpo results return. 2. Follow up PAP 6 months, vs intervention if high grade dysplasia identified 3. Treatment of persistantly abnormal PAP smears and cervical dysplasia, even mild, is discussed w pt today in detail, as well as the pros and cons of Cryo and LEEP procedures. Will consider and discuss after results. 4. Plan IUD exchange soon (MIRENA)     Barnett Applebaum, MD, [place], [facility] 04/09/2017  1:30 PM

## 2017-04-13 LAB — PATHOLOGY

## 2017-04-14 NOTE — Progress Notes (Signed)
D/w pt CIN I; plan PAP q 6 mos until normal series; reassured

## 2017-05-03 ENCOUNTER — Encounter: Payer: Self-pay | Admitting: Obstetrics & Gynecology

## 2017-05-03 ENCOUNTER — Ambulatory Visit (INDEPENDENT_AMBULATORY_CARE_PROVIDER_SITE_OTHER): Payer: BLUE CROSS/BLUE SHIELD | Admitting: Obstetrics & Gynecology

## 2017-05-03 VITALS — BP 100/70 | HR 80 | Ht 62.0 in | Wt 179.0 lb

## 2017-05-03 DIAGNOSIS — Z23 Encounter for immunization: Secondary | ICD-10-CM

## 2017-05-03 DIAGNOSIS — Z30433 Encounter for removal and reinsertion of intrauterine contraceptive device: Secondary | ICD-10-CM | POA: Diagnosis not present

## 2017-05-03 NOTE — Patient Instructions (Signed)

## 2017-05-03 NOTE — Progress Notes (Signed)
  History of Present Illness:  Stephanie Ayala is a 27 y.o. that had a Mirena IUD placed approximately 5 years ago. Since that time, she states that she has done well with no periods while on Mirena.  Has had some recent spotting.  The following portions of the patient's history were reviewed and updated as appropriate: allergies, current medications, past family history, past medical history, past social history, past surgical history and problem list.  Patient Active Problem List   Diagnosis Date Noted  . Low grade squamous intraepith lesion on cytologic smear cervix (lgsil) 04/09/2017   Medications:  Current Outpatient Medications on File Prior to Visit  Medication Sig Dispense Refill  . ciprofloxacin (CIPRO) 500 MG tablet Take 1 tablet (500 mg total) by mouth 2 (two) times daily. 14 tablet 0  . citalopram (CELEXA) 40 MG tablet Take by mouth.    . Diclofenac Sodium CR 100 MG 24 hr tablet Take by mouth.    . levonorgestrel (MIRENA) 20 MCG/24HR IUD 1 each once by Intrauterine route.    . mesalamine (CANASA) 1000 MG suppository Place rectally.    . mesalamine (CANASA) 1000 MG suppository Place rectally.    Marland Kitchen azithromycin (ZITHROMAX Z-PAK) 250 MG tablet Use as directed on box. (Patient not taking: Reported on 02/12/2017) 6 each 0  . mesalamine (LIALDA) 1.2 g EC tablet TAKE 4 TABLETS BY MOUTH EVERY DAY WITH BREAKFAST  3  . Multiple Vitamins-Minerals (MULTIVITAMIN WITH MINERALS) tablet Take 1 tablet by mouth daily.     Allergies: has No Known Allergies.  Physical Exam:  BP 100/70   Pulse 80   Ht 5' 2"  (1.575 m)   Wt 179 lb (81.2 kg)   BMI 32.74 kg/m  Body mass index is 32.74 kg/m. Constitutional: Well nourished, well developed female in no acute distress.  Abdomen: diffusely non tender to palpation, non distended, and no masses, hernias Neuro: Grossly intact Psych:  Normal mood and affect.    Pelvic exam:  Two IUD strings present seen coming from the cervical os. EGBUS, vaginal vault  and cervix: within normal limits  IUD Removal Strings of IUD identified and grasped.  IUD removed without problem.  Pt tolerated this well.  IUD noted to be intact.  IUD Insertion Procedure Note Patient identified, informed consent performed, consent signed.   Discussed risks of irregular bleeding, cramping, infection, malpositioning or misplacement of the IUD outside the uterus which may require further procedure such as laparoscopy, risk of failure <1%. Time out was performed.  Urine pregnancy test negative.  A bimanual exam showed the uterus to be midposition.  Speculum placed in the vagina.  Cervix visualized.  Cleaned with Betadine x 2.  Grasped anteriorly with a single tooth tenaculum.  Uterus sounded to 7 cm.   IUD placed per manufacturer's recommendations.  Strings trimmed to 3 cm. Tenaculum was removed, good hemostasis noted.  Patient tolerated procedure well.   Patient was given post-procedure instructions.  She was advised to have backup contraception for one week.  Patient was also asked to check IUD strings periodically and follow up in 4 weeks for IUD check.  Assessment: IUD Removal and Reinsertion  Plan: IUD removed and plan for contraception is today's reinsertion of a Mirena IUD. She was amenable to this plan and tolerated procedure well Flu shot today F/u one month  Barnett Applebaum, M.D. 05/03/2017 9:50 AM

## 2017-05-03 NOTE — Addendum Note (Signed)
Addended by: Quintella Baton D on: 05/03/2017 10:21 AM   Modules accepted: Orders

## 2017-05-12 NOTE — Telephone Encounter (Signed)
03/18/17 Apt was cancelled. Pt received Mirena on 05/03/17

## 2017-06-01 ENCOUNTER — Ambulatory Visit: Payer: BLUE CROSS/BLUE SHIELD | Admitting: Obstetrics & Gynecology

## 2017-06-04 ENCOUNTER — Encounter: Payer: Self-pay | Admitting: Obstetrics & Gynecology

## 2017-06-04 ENCOUNTER — Ambulatory Visit: Payer: BLUE CROSS/BLUE SHIELD | Admitting: Obstetrics & Gynecology

## 2017-06-04 VITALS — BP 120/80 | HR 97 | Ht 61.0 in | Wt 182.0 lb

## 2017-06-04 DIAGNOSIS — Z30431 Encounter for routine checking of intrauterine contraceptive device: Secondary | ICD-10-CM | POA: Insufficient documentation

## 2017-06-04 NOTE — Progress Notes (Signed)
  History of Present Illness:  Stephanie Ayala is a 27 y.o. that had a Mirena IUD placed approximately 1 month ago. Since that time, she states that she has had no bleeding and pain  Prior PAP and colpo/bx results discussed as well; CIN I, plan for PAP q 6 mos until series of normals.  PMHx: She  has a past medical history of Autoimmune disease ([place]). Also,  has a past surgical history that includes Ovary surgery (Bilateral)., family history includes Diabetes in her father.,  reports that she has been smoking cigarettes.  She has been smoking about 0.50 packs per day. she has never used smokeless tobacco. She reports that she drinks alcohol. She reports that she does not use drugs. Current Meds  Medication Sig  . citalopram (CELEXA) 40 MG tablet Take by mouth.  . Diclofenac Sodium CR 100 MG 24 hr tablet Take by mouth.  . levonorgestrel (MIRENA) 20 MCG/24HR IUD 1 each once by Intrauterine route.  . mesalamine (CANASA) 1000 MG suppository Place rectally.  . mesalamine (CANASA) 1000 MG suppository Place rectally.  . mesalamine (LIALDA) 1.2 g EC tablet TAKE 4 TABLETS BY MOUTH EVERY DAY WITH BREAKFAST  . Multiple Vitamins-Minerals (MULTIVITAMIN WITH MINERALS) tablet Take 1 tablet by mouth daily.  Marland Kitchen trimethoprim-polymyxin b (POLYTRIM) ophthalmic solution   .  Also, has No Known Allergies..  Review of Systems  All other systems reviewed and are negative.  Physical Exam:  BP 120/80   Pulse 97   Ht 5' 1"  (1.549 m)   Wt 182 lb (82.6 kg)   BMI 34.39 kg/m  Body mass index is 34.39 kg/m. Constitutional: Well nourished, well developed female in no acute distress.  Abdomen: diffusely non tender to palpation, non distended, and no masses, hernias Neuro: Grossly intact Psych:  Normal mood and affect.    Pelvic exam:  Two IUD strings present seen coming from the cervical os. EGBUS, vaginal vault and cervix: within normal limits  Assessment: IUD strings present in proper location; pt doing  well  Plan: She was told to continue to use barrier contraception, in order to prevent any STIs, and to take a home pregnancy test or call us if she ever thinks she may be pregnant, and that her IUD expires in 5 years.  She was amenable to this plan and we will see her back  For PAP in 4 mos.  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  Reassured.  A total of 15 minutes were spent face-to-face with the patient during this encounter and over half of that time dealt with counseling and coordination of care.  Barnett Applebaum, MD, [facility], [facility] 06/04/2017  10:45 AM

## 2017-09-29 DIAGNOSIS — M797 Fibromyalgia: Secondary | ICD-10-CM | POA: Insufficient documentation

## 2017-09-29 DIAGNOSIS — K5190 Ulcerative colitis, unspecified, without complications: Secondary | ICD-10-CM | POA: Insufficient documentation

## 2017-09-29 DIAGNOSIS — F329 Major depressive disorder, single episode, unspecified: Secondary | ICD-10-CM | POA: Insufficient documentation

## 2017-09-29 DIAGNOSIS — F32A Depression, unspecified: Secondary | ICD-10-CM | POA: Insufficient documentation

## 2017-10-04 ENCOUNTER — Ambulatory Visit: Payer: Self-pay | Admitting: Obstetrics & Gynecology

## 2017-10-13 ENCOUNTER — Ambulatory Visit: Payer: Self-pay | Admitting: Obstetrics & Gynecology

## 2018-04-28 ENCOUNTER — Other Ambulatory Visit (HOSPITAL_COMMUNITY)
Admission: RE | Admit: 2018-04-28 | Discharge: 2018-04-28 | Disposition: A | Discharge status: Home / Self Care | Payer: BLUE CROSS/BLUE SHIELD | Source: Ambulatory Visit | Attending: Obstetrics & Gynecology | Admitting: Obstetrics & Gynecology

## 2018-04-28 ENCOUNTER — Encounter: Payer: Self-pay | Admitting: Obstetrics & Gynecology

## 2018-04-28 ENCOUNTER — Ambulatory Visit (INDEPENDENT_AMBULATORY_CARE_PROVIDER_SITE_OTHER): Payer: BLUE CROSS/BLUE SHIELD | Admitting: Obstetrics & Gynecology

## 2018-04-28 VITALS — BP 110/78 | Ht 62.0 in | Wt 200.0 lb

## 2018-04-28 DIAGNOSIS — N870 Mild cervical dysplasia: Secondary | ICD-10-CM

## 2018-04-28 NOTE — Patient Instructions (Signed)
Pap Test  Why am I having this test?  A Pap test, also called a Pap smear, is a screening test to check for signs of:  · Cancer of the vagina, cervix, and uterus. The cervix is the lower part of the uterus that opens into the vagina.  · Infection.  · Changes that may be a sign that cancer is developing (precancerous changes).  Women need this test on a regular basis. In general, you should have a Pap test every 3 years until you reach menopause or age 28. Women aged 30-60 may choose to have their Pap test done at the same time as an HPV (human papillomavirus) test every 5 years (instead of every 3 years).  Your health care provider may recommend having Pap tests more or less often depending on your medical conditions and past Pap test results.  What kind of sample is taken?    Your health care provider will collect a sample of cells from the surface of your cervix. This will be done using a small cotton swab, plastic spatula, or brush. This sample is often collected during a pelvic exam, when you are lying on your back on an exam table with feet in footrests (stirrups).  In some cases, fluids (secretions) from the cervix or vagina may also be collected.  How do I prepare for this test?  · Be aware of where you are in your menstrual cycle. If you are menstruating on the day of the test, you may be asked to reschedule.  · You may need to reschedule if you have a known vaginal infection on the day of the test.  · Follow instructions from your health care provider about:  ? Changing or stopping your regular medicines. Some medicines can cause abnormal test results, such as digitalis and tetracycline.  ? Avoiding douching or taking a bath the day before or the day of the test.  Tell a health care provider about:  · Any allergies you have.  · All medicines you are taking, including vitamins, herbs, eye drops, creams, and over-the-counter medicines.  · Any blood disorders you have.  · Any surgeries you have had.  · Any  medical conditions you have.  · Whether you are pregnant or may be pregnant.  How are the results reported?  Your test results will be reported as either abnormal or normal.  A false-positive result can occur. A false positive is incorrect because it means that a condition is present when it is not.  A false-negative result can occur. A false negative is incorrect because it means that a condition is not present when it is.  What do the results mean?  A normal test result means that you do not have signs of cancer of the vagina, cervix, or uterus.  An abnormal result may mean that you have:  · Cancer. A Pap test by itself is not enough to diagnose cancer. You will have more tests done in this case.  · Precancerous changes in your vagina, cervix, or uterus.  · Inflammation of the cervix.  · An STD (sexually transmitted disease).  · A fungal infection.  · A parasite infection.  Talk with your health care provider about what your results mean.  Questions to ask your health care provider  Ask your health care provider, or the department that is doing the test:  · When will my results be ready?  · How will I get my results?  · What are my   treatment options?  · What other tests do I need?  · What are my next steps?  Summary  · In general, women should have a Pap test every 3 years until they reach menopause or age 28.  · Your health care provider will collect a sample of cells from the surface of your cervix. This will be done using a small cotton swab, plastic spatula, or brush.  · In some cases, fluids (secretions) from the cervix or vagina may also be collected.  This information is not intended to replace advice given to you by your health care provider. Make sure you discuss any questions you have with your health care provider.  Document Released: 06/06/2002 Document Revised: 11/23/2016 Document Reviewed: 11/23/2016  Elsevier Interactive Patient Education © 2019 [employer]

## 2018-04-28 NOTE — Addendum Note (Signed)
Addended by: Gae Dry on: 04/28/2018 02:49 PM   Modules accepted: Orders

## 2018-04-28 NOTE — Progress Notes (Signed)
  HPI:  Patient is a 28 y.o. G1P0010 presenting for follow up evaluation of abnormal PAP smear in the past.  Her last PAP was 14 months ago and was abnormal: LGSIL. She has had a prior colposcopy. Prior biopsies (if done) were CIN I, 1 year ago. Has IUD placed last year.  No periods. C/O Decreased Libido, esp after starting Cymbalta  PMHx: She  has a past medical history of Autoimmune disease ([place]), Colitis, and Fibromyalgia. Also,  has a past surgical history that includes Ovary surgery (Bilateral)., family history includes Diabetes in her father; Hypertension in her father; Rheum arthritis in her mother.,  reports that she has been smoking cigarettes. She has been smoking about 0.50 packs per day. She has never used smokeless tobacco. She reports current alcohol use. She reports that she does not use drugs.  She has a current medication list which includes the following prescription(s): duloxetine, levonorgestrel, mesalamine, azithromycin, ciprofloxacin, citalopram, mesalamine, mesalamine, multivitamin with minerals, naltrexone, tramadol, and trimethoprim-polymyxin b. Also, has No Known Allergies.  Review of Systems  All other systems reviewed and are negative.   Objective: BP 110/78   Ht 5' 2"  (1.575 m)   Wt 200 lb (90.7 kg)   BMI 36.58 kg/m  Filed Weights   04/28/18 1348  Weight: 200 lb (90.7 kg)   Body mass index is 36.58 kg/m.  Physical examination Physical Exam Constitutional:      General: She is not in acute distress.    Appearance: She is well-developed.  Genitourinary:     Pelvic exam was performed with patient supine.     Vagina and uterus normal.     No vaginal erythema or bleeding.     No cervical motion tenderness, discharge, polyp or nabothian cyst.     Uterus is mobile.     Uterus is not enlarged.     No uterine mass detected.    Uterus is midaxial.     No right or left adnexal mass present.     Right adnexa not tender.     Left adnexa not tender.  HENT:     Head: Normocephalic and atraumatic.     Nose: Nose normal.  Abdominal:     General: There is no distension.     Palpations: Abdomen is soft.     Tenderness: There is no abdominal tenderness.  Musculoskeletal: Normal range of motion.  Neurological:     Mental Status: She is alert and oriented to person, place, and time.     Cranial Nerves: No cranial nerve deficit.  Skin:    General: Skin is warm and dry.   ASSESSMENT:  History of Cervical Dysplasia - CIN I  Plan: 1.  I discussed the grading system of pap smears and HPV high risk viral types.   2. Follow up PAP 6 months, vs intervention if high grade dysplasia identified. 3. Treatment of persistantly abnormal PAP smears and cervical dysplasia, even mild, is discussed w pt today in detail, as well as the pros and cons of Cryo and LEEP procedures. Will consider and discuss after results.  4. IUD no concerns 5. Low Libido, likely SE medicine (not hypsoexual arousal disorder).  Meds not approved to help.  Consider change in meds w her psychiatrist.  Barnett Applebaum, MD, [facility], [facility] 04/28/2018  2:22 PM

## 2018-05-03 LAB — CYTOLOGY - PAP: Diagnosis: NEGATIVE

## 2018-08-04 ENCOUNTER — Other Ambulatory Visit (HOSPITAL_COMMUNITY)
Admission: RE | Admit: 2018-08-04 | Discharge: 2018-08-04 | Disposition: A | Discharge status: Home / Self Care | Payer: BLUE CROSS/BLUE SHIELD | Source: Ambulatory Visit | Attending: Obstetrics & Gynecology | Admitting: Obstetrics & Gynecology

## 2018-08-04 ENCOUNTER — Encounter: Payer: Self-pay | Admitting: Obstetrics & Gynecology

## 2018-08-04 ENCOUNTER — Ambulatory Visit (INDEPENDENT_AMBULATORY_CARE_PROVIDER_SITE_OTHER): Payer: BLUE CROSS/BLUE SHIELD | Admitting: Obstetrics & Gynecology

## 2018-08-04 ENCOUNTER — Other Ambulatory Visit: Payer: Self-pay

## 2018-08-04 VITALS — BP 120/80 | Ht 62.0 in | Wt 205.0 lb

## 2018-08-04 DIAGNOSIS — Z30432 Encounter for removal of intrauterine contraceptive device: Secondary | ICD-10-CM | POA: Diagnosis not present

## 2018-08-04 DIAGNOSIS — N870 Mild cervical dysplasia: Secondary | ICD-10-CM | POA: Diagnosis present

## 2018-08-04 NOTE — Progress Notes (Signed)
HPI:  Patient is a 28 y.o. G1P0010 presenting for follow up evaluation of abnormal PAP smear in the past.  Her last PAP was 5 months ago and was normal and prior PAP was LGSIL. She has had a prior colposcopy. Prior biopsies (if done) were CIN I.  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 that had a Mirena IUD placed approximately 1 years ago. Since that time, she states that has done well w no periods, but desires pregnancy ion the near future.Marland Kitchen   PMHx: She  has a past medical history of Autoimmune disease ([place]), Colitis, and Fibromyalgia. Also,  has a past surgical history that includes Ovary surgery (Bilateral)., family history includes Diabetes in her father; Hypertension in her father; Rheum arthritis in her mother.,  reports that she has been smoking cigarettes. She has been smoking about 0.50 packs per day. She has never used smokeless tobacco. She reports current alcohol use. She reports that she does not use drugs.  She has a current medication list which includes the following prescription(s): duloxetine, levonorgestrel, mesalamine, multivitamin with minerals, naltrexone, tramadol, azithromycin, ciprofloxacin, citalopram, mesalamine, mesalamine, and trimethoprim-polymyxin b. Also, has No Known Allergies.  Review of Systems  Constitutional: Negative for chills, fever and malaise/fatigue.  HENT: Negative for congestion, sinus pain and sore throat.   Eyes: Negative for blurred vision and pain.  Respiratory: Negative for cough and wheezing.   Cardiovascular: Negative for chest pain and leg swelling.  Gastrointestinal: Negative for abdominal pain, constipation, diarrhea, heartburn, nausea and vomiting.  Genitourinary: Negative for dysuria, frequency, hematuria and urgency.  Musculoskeletal: Negative for back pain, joint pain, myalgias and neck pain.  Skin: Negative for itching and rash.  Neurological: Negative for dizziness, tremors and weakness.  Endo/Heme/Allergies: Does not bruise/bleed easily.   Psychiatric/Behavioral: Negative for depression. The patient is not nervous/anxious and does not have insomnia.     Objective: BP 120/80   Ht 5' 2"  (1.575 m)   Wt 205 lb (93 kg)   BMI 37.49 kg/m  Filed Weights   08/04/18 1035  Weight: 205 lb (93 kg)   Body mass index is 37.49 kg/m.  Physical examination Physical Exam Constitutional:      General: She is not in acute distress.    Appearance: She is well-developed.  Genitourinary:     Pelvic exam was performed with patient supine.     Vagina and uterus normal.     No vaginal erythema or bleeding.     No cervical motion tenderness, discharge, polyp or nabothian cyst.     IUD strings visualized.     Uterus is mobile.     Uterus is not enlarged.     No uterine mass detected.    Uterus is midaxial.     No right or left adnexal mass present.     Right adnexa not tender.     Left adnexa not tender.  HENT:     Head: Normocephalic and atraumatic.     Nose: Nose normal.  Abdominal:     General: There is no distension.     Palpations: Abdomen is soft.     Tenderness: There is no abdominal tenderness.  Musculoskeletal: Normal range of motion.  Neurological:     Mental Status: She is alert and oriented to person, place, and time.     Cranial Nerves: No cranial nerve deficit.  Skin:    General: Skin is warm and dry.     ASSESSMENT:  History of Cervical Dysplasia 1. CIN  I (cervical intraepithelial neoplasia I) PAP follow up today One year if again normal today  2. Encounter for IUD removal Remove for pregnancy trial today, see below   IUD Removal Pelvic exam:  Two IUD strings present seen coming from the cervical os. EGBUS, vaginal vault and cervix: within normal limits  Strings of IUD identified and grasped.  IUD removed without problem.  Pt tolerated this well.  IUD noted to be intact.  Assessment: IUD Removal  Plan: IUD removed and plan for contraception is no method. She was amenable to this plan.  Barnett Applebaum, MD, [facility], [facility] 08/04/2018  11:02 AM

## 2018-08-08 LAB — CYTOLOGY - PAP

## 2018-10-10 ENCOUNTER — Encounter: Payer: Self-pay | Admitting: Obstetrics & Gynecology

## 2018-12-26 ENCOUNTER — Encounter: Payer: Self-pay | Admitting: Obstetrics & Gynecology

## 2019-03-31 NOTE — L&D Delivery Note (Signed)
Vaginal Delivery Note  Spontaneous delivery of live viable female infant from the LOA position through an intact perineum. Delivery of anterior right shoulder with gentle downward guidance followed by delivery of the left posterior shoulder with gentle upward guidance. Body followed spontaneously. Infant placed on maternal chest. Nursery present and helped with neonatal resuscitation and evaluation. Cord clamped and cut after one minute. Cord blood collected. Placenta delivered spontaneously and intact with a 3 vessel cord.  Bilateral periurethral laceration. Uterus firm and below umbilicus at the end of the delivery.  Mom and baby recovering in stable condition. Sponge and needle counts were correct at the end of the delivery. Straight catheter performed to drain the bladder which was leaking urine with fundal massage.   APGARS: 1 minute:8 5 minutes: 9 Weight: pending Epidural Patient given methergine and cytotec for postpartum hemorrhage. EBL 1000 cc. QBL pending.    Adrian Prows MD [facility], [facility] 12/25/19 5:48 AM

## 2019-05-16 ENCOUNTER — Other Ambulatory Visit: Payer: Self-pay | Admitting: Obstetrics & Gynecology

## 2019-05-16 NOTE — Telephone Encounter (Signed)
Please advise 

## 2019-05-26 ENCOUNTER — Ambulatory Visit (INDEPENDENT_AMBULATORY_CARE_PROVIDER_SITE_OTHER): Payer: Self-pay | Admitting: Obstetrics & Gynecology

## 2019-05-26 ENCOUNTER — Encounter: Payer: BLUE CROSS/BLUE SHIELD | Admitting: Obstetrics & Gynecology

## 2019-05-26 ENCOUNTER — Other Ambulatory Visit: Payer: Self-pay

## 2019-05-26 ENCOUNTER — Encounter: Payer: Self-pay | Admitting: Obstetrics & Gynecology

## 2019-05-26 VITALS — BP 120/80 | Wt 202.0 lb

## 2019-05-26 DIAGNOSIS — N926 Irregular menstruation, unspecified: Secondary | ICD-10-CM

## 2019-05-26 DIAGNOSIS — O0990 Supervision of high risk pregnancy, unspecified, unspecified trimester: Secondary | ICD-10-CM | POA: Insufficient documentation

## 2019-05-26 DIAGNOSIS — Z3490 Encounter for supervision of normal pregnancy, unspecified, unspecified trimester: Secondary | ICD-10-CM

## 2019-05-26 DIAGNOSIS — Z3A08 8 weeks gestation of pregnancy: Secondary | ICD-10-CM

## 2019-05-26 DIAGNOSIS — Z3481 Encounter for supervision of other normal pregnancy, first trimester: Secondary | ICD-10-CM

## 2019-05-26 DIAGNOSIS — Z131 Encounter for screening for diabetes mellitus: Secondary | ICD-10-CM

## 2019-05-26 NOTE — Patient Instructions (Addendum)
Due Date 01/07/2020  First Trimester of Pregnancy The first trimester of pregnancy is from week 1 until the end of week 13 (months 1 through 3). A week after a sperm fertilizes an egg, the egg will implant on the wall of the uterus. This embryo will begin to develop into a baby. Genes from you and your partner will form the baby. The female genes will determine whether the baby will be a boy or a girl. At 6-8 weeks, the eyes and face will be formed, and the heartbeat can be seen on ultrasound. At the end of 12 weeks, all the baby's organs will be formed. Now that you are pregnant, you will want to do everything you can to have a healthy baby. Two of the most important things are to get good prenatal care and to follow your health care provider's instructions. Prenatal care is all the medical care you receive before the baby's birth. This care will help prevent, find, and treat any problems during the pregnancy and childbirth. Body changes during your first trimester Your body goes through many changes during pregnancy. The changes vary from woman to woman.  You may gain or lose a couple of pounds at first.  You may feel sick to your stomach (nauseous) and you may throw up (vomit). If the vomiting is uncontrollable, call your health care provider.  You may tire easily.  You may develop headaches that can be relieved by medicines. All medicines should be approved by your health care provider.  You may urinate more often. Painful urination may mean you have a bladder infection.  You may develop heartburn as a result of your pregnancy.  You may develop constipation because certain hormones are causing the muscles that push stool through your intestines to slow down.  You may develop hemorrhoids or swollen veins (varicose veins).  Your breasts may begin to grow larger and become tender. Your nipples may stick out more, and the tissue that surrounds them (areola) may become darker.  Your gums may  bleed and may be sensitive to brushing and flossing.  Dark spots or blotches (chloasma, mask of pregnancy) may develop on your face. This will likely fade after the baby is born.  Your menstrual periods will stop.  You may have a loss of appetite.  You may develop cravings for certain kinds of food.  You may have changes in your emotions from day to day, such as being excited to be pregnant or being concerned that something may go wrong with the pregnancy and baby.  You may have more vivid and strange dreams.  You may have changes in your hair. These can include thickening of your hair, rapid growth, and changes in texture. Some women also have hair loss during or after pregnancy, or hair that feels dry or thin. Your hair will most likely return to normal after your baby is born. What to expect at prenatal visits During a routine prenatal visit:  You will be weighed to make sure you and the baby are growing normally.  Your blood pressure will be taken.  Your abdomen will be measured to track your baby's growth.  The fetal heartbeat will be listened to between weeks 10 and 14 of your pregnancy.  Test results from any previous visits will be discussed. Your health care provider may ask you:  How you are feeling.  If you are feeling the baby move.  If you have had any abnormal symptoms, such as leaking fluid, bleeding,  severe headaches, or abdominal cramping.  If you are using any tobacco products, including cigarettes, chewing tobacco, and electronic cigarettes.  If you have any questions. Other tests that may be performed during your first trimester include:  Blood tests to find your blood type and to check for the presence of any previous infections. The tests will also be used to check for low iron levels (anemia) and protein on red blood cells (Rh antibodies). Depending on your risk factors, or if you previously had diabetes during pregnancy, you may have tests to check for  high blood sugar that affects pregnant women (gestational diabetes).  Urine tests to check for infections, diabetes, or protein in the urine.  An ultrasound to confirm the proper growth and development of the baby.  Fetal screens for spinal cord problems (spina bifida) and Down syndrome.  HIV (human immunodeficiency virus) testing. Routine prenatal testing includes screening for HIV, unless you choose not to have this test.  You may need other tests to make sure you and the baby are doing well. Follow these instructions at home: Medicines  Follow your health care provider's instructions regarding medicine use. Specific medicines may be either safe or unsafe to take during pregnancy.  Take a prenatal vitamin that contains at least 600 micrograms (mcg) of folic acid.  If you develop constipation, try taking a stool softener if your health care provider approves. Eating and drinking   Eat a balanced diet that includes fresh fruits and vegetables, whole grains, good sources of protein such as meat, eggs, or tofu, and low-fat dairy. Your health care provider will help you determine the amount of weight gain that is right for you.  Avoid raw meat and uncooked cheese. These carry germs that can cause birth defects in the baby.  Eating four or five small meals rather than three large meals a day may help relieve nausea and vomiting. If you start to feel nauseous, eating a few soda crackers can be helpful. Drinking liquids between meals, instead of during meals, also seems to help ease nausea and vomiting.  Limit foods that are high in fat and processed sugars, such as fried and sweet foods.  To prevent constipation: ? Eat foods that are high in fiber, such as fresh fruits and vegetables, whole grains, and beans. ? Drink enough fluid to keep your urine clear or pale yellow. Activity  Exercise only as directed by your health care provider. Most women can continue their usual exercise routine  during pregnancy. Try to exercise for 30 minutes at least 5 days a week. Exercising will help you: ? Control your weight. ? Stay in shape. ? Be prepared for labor and delivery.  Experiencing pain or cramping in the lower abdomen or lower back is a good sign that you should stop exercising. Check with your health care provider before continuing with normal exercises.  Try to avoid standing for long periods of time. Move your legs often if you must stand in one place for a long time.  Avoid heavy lifting.  Wear low-heeled shoes and practice good posture.  You may continue to have sex unless your health care provider tells you not to. Relieving pain and discomfort  Wear a good support bra to relieve breast tenderness.  Take warm sitz baths to soothe any pain or discomfort caused by hemorrhoids. Use hemorrhoid cream if your health care provider approves.  Rest with your legs elevated if you have leg cramps or low back pain.  If you  develop varicose veins in your legs, wear support hose. Elevate your feet for 15 minutes, 3-4 times a day. Limit salt in your diet. Prenatal care  Schedule your prenatal visits by the twelfth week of pregnancy. They are usually scheduled monthly at first, then more often in the last 2 months before delivery.  Write down your questions. Take them to your prenatal visits.  Keep all your prenatal visits as told by your health care provider. This is important. Safety  Wear your seat belt at all times when driving.  Make a list of emergency phone numbers, including numbers for family, friends, the hospital, and police and fire departments. General instructions  Ask your health care provider for a referral to a local prenatal education class. Begin classes no later than the beginning of month 6 of your pregnancy.  Ask for help if you have counseling or nutritional needs during pregnancy. Your health care provider can offer advice or refer you to specialists  for help with various needs.  Do not use hot tubs, steam rooms, or saunas.  Do not douche or use tampons or scented sanitary pads.  Do not cross your legs for long periods of time.  Avoid cat litter boxes and soil used by cats. These carry germs that can cause birth defects in the baby and possibly loss of the fetus by miscarriage or stillbirth.  Avoid all smoking, herbs, alcohol, and medicines not prescribed by your health care provider. Chemicals in these products affect the formation and growth of the baby.  Do not use any products that contain nicotine or tobacco, such as cigarettes and e-cigarettes. If you need help quitting, ask your health care provider. You may receive counseling support and other resources to help you quit.  Schedule a dentist appointment. At home, brush your teeth with a soft toothbrush and be gentle when you floss. Contact a health care provider if:  You have dizziness.  You have mild pelvic cramps, pelvic pressure, or nagging pain in the abdominal area.  You have persistent nausea, vomiting, or diarrhea.  You have a bad smelling vaginal discharge.  You have pain when you urinate.  You notice increased swelling in your face, hands, legs, or ankles.  You are exposed to fifth disease or chickenpox.  You are exposed to [place] measles (rubella) and have never had it. Get help right away if:  You have a fever.  You are leaking fluid from your vagina.  You have spotting or bleeding from your vagina.  You have severe abdominal cramping or pain.  You have rapid weight gain or loss.  You vomit blood or material that looks like coffee grounds.  You develop a severe headache.  You have shortness of breath.  You have any kind of trauma, such as from a fall or a car accident. Summary  The first trimester of pregnancy is from week 1 until the end of week 13 (months 1 through 3).  Your body goes through many changes during pregnancy. The changes  vary from woman to woman.  You will have routine prenatal visits. During those visits, your health care provider will examine you, discuss any test results you may have, and talk with you about how you are feeling. This information is not intended to replace advice given to you by your health care provider. Make sure you discuss any questions you have with your health care provider. Document Revised: 02/26/2017 Document Reviewed: 02/26/2016 Elsevier Patient Education  2020 [employer].

## 2019-05-26 NOTE — Progress Notes (Signed)
05/26/2019   Chief Complaint: Missed period  History of Present Illness: Ms. Kentner is a 29 y.o. G2P0010 41w5dbased on Patient's last menstrual period was 04/02/2019. with an Estimated Date of Delivery: 01/07/20, with the above CC.   Her periods were: regular periods every 28 days She was using no method when she conceived. IUD removed last May 2020. She has Positive signs or symptoms of nausea/vomiting of pregnancy. She has Negative signs or symptoms of miscarriage or preterm labor She identifies Negative Zika risk factors for her and her partner On any different medications around the time she conceived/early pregnancy: Yes as she takes meds for Ulcerative Colitis and for mood disorder History of varicella: Yes   ROS: A 12-point review of systems was performed and negative, except as stated in the above HPI.  OBGYN History: As per HPI. OB History  Gravida Para Term Preterm AB Living  2       1    SAB TAB Ectopic Multiple Live Births  1            # Outcome Date GA Lbr Len/2nd Weight Sex Delivery Anes PTL Lv  2 Current           1 SAB             Any issues with any prior pregnancies: prior miscarriage many years ago yet she has anxiety related to this Any prior children are healthy, doing well, without any problems or issues: not applicable History of pap smears: Yes. Last pap smear 2020- normal, but prior PAP abnormal (needs repeating) History of STIs: No   Past Medical History: Past Medical History:  Diagnosis Date  . Autoimmune disease (HBlum   . Colitis   . Fibromyalgia     Past Surgical History: Past Surgical History:  Procedure Laterality Date  . OVARY SURGERY Bilateral    cyst removal    Family History:  Family History  Problem Relation Age of Onset  . Rheum arthritis Mother   . Diabetes Father   . Hypertension Father    She denies any female cancers, bleeding or blood clotting disorders.  She denies any history of mental retardation, birth defects or  genetic disorders in her or the FOB's history  Social History:  Social History   Socioeconomic History  . Marital status: Single    Spouse name: Not on file  . Number of children: Not on file  . Years of education: Not on file  . Highest education level: Not on file  Occupational History  . Not on file  Tobacco Use  . Smoking status: Current Every Day Smoker    Packs/day: 0.50    Types: Cigarettes  . Smokeless tobacco: Never Used  Substance and Sexual Activity  . Alcohol use: Yes  . Drug use: No  . Sexual activity: Yes    Birth control/protection: I.U.D.  Other Topics Concern  . Not on file  Social History Narrative  . Not on file   Social Determinants of Health   Financial Resource Strain:   . Difficulty of Paying Living Expenses: Not on file  Food Insecurity:   . Worried About RCharity fundraiserin the Last Year: Not on file  . Ran Out of Food in the Last Year: Not on file  Transportation Needs:   . Lack of Transportation (Medical): Not on file  . Lack of Transportation (Non-Medical): Not on file  Physical Activity:   . Days of Exercise per Week: Not  on file  . Minutes of Exercise per Session: Not on file  Stress:   . Feeling of Stress : Not on file  Social Connections:   . Frequency of Communication with Friends and Family: Not on file  . Frequency of Social Gatherings with Friends and Family: Not on file  . Attends Religious Services: Not on file  . Active Member of Clubs or Organizations: Not on file  . Attends Archivist Meetings: Not on file  . Marital Status: Not on file  Intimate Partner Violence:   . Fear of Current or Ex-Partner: Not on file  . Emotionally Abused: Not on file  . Physically Abused: Not on file  . Sexually Abused: Not on file   Any pets in the household: no  Allergy: No Known Allergies  Current Outpatient Medications:  Current Outpatient Medications:  .  mesalamine (LIALDA) 1.2 g EC tablet, TAKE 4 TABLETS BY MOUTH  EVERY DAY WITH BREAKFAST, Disp: , Rfl: 3 .  Multiple Vitamins-Minerals (MULTIVITAMIN WITH MINERALS) tablet, Take 1 tablet by mouth daily., Disp: , Rfl:  .  citalopram (CELEXA) 40 MG tablet, Take by mouth., Disp: , Rfl:  .  DULoxetine (CYMBALTA) 60 MG capsule, Take by mouth., Disp: , Rfl:  .  mesalamine (CANASA) 1000 MG suppository, Place rectally., Disp: , Rfl:  .  mesalamine (CANASA) 1000 MG suppository, Place rectally., Disp: , Rfl:  .  naltrexone (DEPADE) 50 MG tablet, Take by mouth., Disp: , Rfl:  .  traMADol (ULTRAM) 50 MG tablet, tramadol 50 mg tablet  TAKE 1 TABLET BY MOUTH EVERY 6 HOURS, Disp: , Rfl:  .  trimethoprim-polymyxin b (POLYTRIM) ophthalmic solution, , Disp: , Rfl: 0   Physical Exam:   BP 120/80   Wt 202 lb (91.6 kg)   LMP 04/02/2019   BMI 36.95 kg/m  Body mass index is 36.95 kg/m. Constitutional: Well nourished, well developed female in no acute distress.  Neck:  Supple, normal appearance, and no thyromegaly  Cardiovascular: S1, S2 normal, no murmur, rub or gallop, regular rate and rhythm Respiratory:  Clear to auscultation bilateral. Normal respiratory effort Abdomen: positive bowel sounds and no masses, hernias; diffusely non tender to palpation, non distended Breasts: breasts appear normal, no suspicious masses, no skin or nipple changes or axillary nodes. Neuro/Psych:  Normal mood and affect.  Skin:  Warm and dry.  Lymphatic:  No inguinal lymphadenopathy.   Pelvic exam: deferred, will do when able to do PAP  Assessment: Ms. Settle is a 29 y.o. G2P0010 36w5dbased on Patient's last menstrual period was 04/02/2019. with an Estimated Date of Delivery: 01/07/20,  for prenatal care.  Plan:  1) Avoid alcoholic beverages. 2) Patient encouraged not to smoke.  3) Discontinue the use of all non-medicinal drugs and chemicals.  4) Take prenatal vitamins daily.  5) Seatbelt use advised 6) Nutrition, food safety (fish, cheese advisories, and high nitrite foods) and  exercise discussed. 7) [facility]  8) Patient is asked about travel to areas at risk for the [place], and counseled to avoid travel and exposure to mosquitoes or sexual partners who may have themselves been exposed to the virus. Testing is discussed, and will be ordered as appropriate.  9) [facility] 10) Genetic Screening, such as with 1st Trimester Screening, cell free fetal DNA, AFP testing, and Ultrasound, as well as with amniocentesis and CVS as appropriate, is discussed with patient. She plans to consider genetic testing  this pregnancy. 11) Labs, PAP, [place] , and possibly NIPT after has Medicaid or other insurance in place 12) UC risk factors discussed, continue meds per GI 13) Cymbalta pros and cons of use in pregnancy discussed 14) Obesity risk factors discussed Early Glucola planned 15) Prior CIN I, rec PAP at [facility]  Problem list reviewed and updated.  Barnett Applebaum, MD, [facility], [facility] 05/26/2019  8:27 AM

## 2019-06-07 ENCOUNTER — Ambulatory Visit (INDEPENDENT_AMBULATORY_CARE_PROVIDER_SITE_OTHER): Payer: Self-pay | Admitting: Advanced Practice Midwife

## 2019-06-07 ENCOUNTER — Encounter: Payer: Self-pay | Admitting: Advanced Practice Midwife

## 2019-06-07 ENCOUNTER — Other Ambulatory Visit: Payer: Self-pay

## 2019-06-07 VITALS — BP 100/70 | Wt 200.0 lb

## 2019-06-07 DIAGNOSIS — Z3A09 9 weeks gestation of pregnancy: Secondary | ICD-10-CM

## 2019-06-07 DIAGNOSIS — O208 Other hemorrhage in early pregnancy: Secondary | ICD-10-CM

## 2019-06-07 DIAGNOSIS — O209 Hemorrhage in early pregnancy, unspecified: Secondary | ICD-10-CM

## 2019-06-07 NOTE — Progress Notes (Signed)
  Routine Prenatal Care Visit  Subjective  Stephanie Ayala is a 29 y.o. G2P0010 at 60w3dbeing seen today for ongoing prenatal care.  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  ----------------------------------------------------------------------------------- Patient reports a silver dollar sized blood clot in the toilet last night. Since then she has worn a pad and has not had any bleeding on the pad. She has had bright red spots with wiping. She denies cramping. She denies recent intercourse. Her dating scan is scheduled for 5 days from now.     . Vag. Bleeding: Small.   . Leaking Fluid denies.  ----------------------------------------------------------------------------------- The following portions of the patient's history were reviewed and updated as appropriate: allergies, current medications, past family history, past medical history, past social history, past surgical history and problem list. Problem list updated.  Objective  Blood pressure 100/70, weight 200 lb (90.7 kg), last menstrual period 04/02/2019. Pregravid weight 194 lb (88 kg) Total Weight Gain 6 lb (2.722 kg) Urinalysis: Urine Protein    Urine Glucose    Fetal Status:           General:  Alert, oriented and cooperative. Patient is in no acute distress.  Skin: Skin is warm and dry. No rash noted.   Cardiovascular: Normal heart rate noted  Respiratory: Normal respiratory effort, no problems with respiration noted  Abdomen: Soft, gravid, appropriate for gestational age. Pain/Pressure: Present     Pelvic:  Cervical exam performed      pink spot of blood at cervical os/no active bleeding/cervix closed  Extremities: Normal range of motion.     Mental Status: Normal  mood and affect. Normal behavior. Normal judgment and thought content.   Assessment   29y.o. G2P0010 at 979w3dy  01/07/2020, by Last Menstrual Period presenting for work-in prenatal visit  Plan   pregnancy2  Problems (from 04/02/19 to present)    No problems associated with this episode.    Beta Hcg today and Friday    Preterm labor symptoms and general obstetric precautions including but not limited to vaginal bleeding, contractions, leaking of fluid and fetal movement were reviewed in detail with the patient.    Return for needs lab only visit on Friday 3/12 and dating scan sooner than 3/17 if possible.  JaRod CanCNM 06/07/2019 2:38 PM

## 2019-06-08 LAB — BETA HCG QUANT (REF LAB): hCG Quant: 123937 m[IU]/mL

## 2019-06-09 ENCOUNTER — Other Ambulatory Visit: Payer: Medicaid Other

## 2019-06-09 ENCOUNTER — Other Ambulatory Visit: Payer: Self-pay

## 2019-06-09 DIAGNOSIS — O209 Hemorrhage in early pregnancy, unspecified: Secondary | ICD-10-CM

## 2019-06-10 LAB — BETA HCG QUANT (REF LAB): hCG Quant: 114089 m[IU]/mL

## 2019-06-12 ENCOUNTER — Telehealth: Payer: Self-pay

## 2019-06-12 NOTE — Telephone Encounter (Signed)
Pt called back, denies any spotting/vaginal bleeding. Will see her at her u/s Wed.

## 2019-06-12 NOTE — Telephone Encounter (Signed)
Pt called hospital nurse line over the weekend asking questions about test results. Called pt to follow up on call, no answer, LVMTRC.

## 2019-06-14 ENCOUNTER — Other Ambulatory Visit: Payer: Self-pay | Admitting: Obstetrics & Gynecology

## 2019-06-14 ENCOUNTER — Encounter: Payer: Self-pay | Admitting: Obstetrics & Gynecology

## 2019-06-14 ENCOUNTER — Other Ambulatory Visit: Payer: Medicaid Other

## 2019-06-14 ENCOUNTER — Ambulatory Visit (INDEPENDENT_AMBULATORY_CARE_PROVIDER_SITE_OTHER): Payer: Self-pay

## 2019-06-14 ENCOUNTER — Other Ambulatory Visit: Payer: Self-pay

## 2019-06-14 ENCOUNTER — Other Ambulatory Visit (HOSPITAL_COMMUNITY)
Admission: RE | Admit: 2019-06-14 | Discharge: 2019-06-14 | Disposition: A | Discharge status: Home / Self Care | Payer: Medicaid Other | Source: Ambulatory Visit | Attending: Obstetrics & Gynecology | Admitting: Obstetrics & Gynecology

## 2019-06-14 ENCOUNTER — Ambulatory Visit (INDEPENDENT_AMBULATORY_CARE_PROVIDER_SITE_OTHER): Payer: Medicaid Other | Admitting: Obstetrics & Gynecology

## 2019-06-14 VITALS — BP 120/80 | Wt 202.0 lb

## 2019-06-14 DIAGNOSIS — N926 Irregular menstruation, unspecified: Secondary | ICD-10-CM

## 2019-06-14 DIAGNOSIS — Z3689 Encounter for other specified antenatal screening: Secondary | ICD-10-CM

## 2019-06-14 DIAGNOSIS — Z3490 Encounter for supervision of normal pregnancy, unspecified, unspecified trimester: Secondary | ICD-10-CM

## 2019-06-14 DIAGNOSIS — Z131 Encounter for screening for diabetes mellitus: Secondary | ICD-10-CM

## 2019-06-14 DIAGNOSIS — N870 Mild cervical dysplasia: Secondary | ICD-10-CM | POA: Insufficient documentation

## 2019-06-14 DIAGNOSIS — Z3481 Encounter for supervision of other normal pregnancy, first trimester: Secondary | ICD-10-CM

## 2019-06-14 DIAGNOSIS — Z124 Encounter for screening for malignant neoplasm of cervix: Secondary | ICD-10-CM

## 2019-06-14 DIAGNOSIS — Z3A09 9 weeks gestation of pregnancy: Secondary | ICD-10-CM

## 2019-06-14 DIAGNOSIS — R110 Nausea: Secondary | ICD-10-CM

## 2019-06-14 DIAGNOSIS — Z3A10 10 weeks gestation of pregnancy: Secondary | ICD-10-CM

## 2019-06-14 LAB — OB RESULTS CONSOLE VARICELLA ZOSTER ANTIBODY, IGG: Varicella: NON-IMMUNE/NOT IMMUNE

## 2019-06-14 NOTE — Patient Instructions (Signed)

## 2019-06-14 NOTE — Progress Notes (Signed)
Prenatal Visit Note Date: 06/14/2019 Clinic: [facility]  Subjective:  Stephanie Ayala is a 29 y.o. G2P0010 at 18w3dbeing seen today for ongoing prenatal care.  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  Patient reports :  She had spotting that has since resolved.  Nausea regularly  The following portions of the patient's history were reviewed and updated as appropriate: allergies, current medications, past family history, past medical history, past social history, past surgical history and problem list. Problem list updated.  Objective:   Vitals:   06/14/19 1510  BP: 120/80  Weight: 202 lb (91.6 kg)    Fetal Status:           General:  Alert, oriented and cooperative. Patient is in no acute distress.  Skin: Skin is warm and dry. No rash noted.   Cardiovascular: Normal heart rate noted  Respiratory: Normal respiratory effort, no problems with respiration noted  Abdomen: Soft, gravid, appropriate for gestational age. Pain/Pressure: Absent     Pelvic:  Cervical exam performed        CLosed, thick cervix, no blood  Extremities: Normal range of motion.     Mental Status: Normal mood and affect. Normal behavior. Normal judgment and thought content.   Urinalysis:    neg  Assessment and Plan:  Pregnancy: G2P0010 at 929w3d1. [redacted] weeks gestation of pregnancy PNV  2. Encounter for supervision of low-risk pregnancy, antepartum - Urine Culture - Cont Lialda for Ulcerative Colitis.  Discussed taking Folic acid 2 mg daily to ensure adequate supplementation - Cont Cymbalta per pt choice  3. CIN I (cervical intraepithelial neoplasia I) - Cytology - PAP today  Review of ULTRASOUND.    [facility]   Plan of management to be discussed with patient.  Return in about 4 weeks (around 07/12/2019) for ROB.  PaBarnett ApplebaumMD, FALoura Pardonb/Gyn, [facility] 06/14/2019  3:51 PM

## 2019-06-15 LAB — GLUCOSE, 1 HOUR GESTATIONAL: Gestational Diabetes Screen: 104 mg/dL (ref 65–139)

## 2019-06-15 LAB — RPR+RH+ABO+RUB AB+AB SCR+CB...
Antibody Screen: NEGATIVE
HIV Screen 4th Generation wRfx: NONREACTIVE
Hematocrit: 37.1 % (ref 34.0–46.6)
Hemoglobin: 12.6 g/dL (ref 11.1–15.9)
Hepatitis B Surface Ag: NEGATIVE
MCH: 29.3 pg (ref 26.6–33.0)
MCHC: 34 g/dL (ref 31.5–35.7)
MCV: 86 fL (ref 79–97)
Platelets: 423 10*3/uL (ref 150–450)
RBC: 4.3 x10E6/uL (ref 3.77–5.28)
RDW: 12.6 % (ref 11.7–15.4)
RPR Ser Ql: NONREACTIVE
Rh Factor: NEGATIVE
Rubella Antibodies, IGG: <0.9 index — ABNORMAL LOW (ref >0.99)
Varicella zoster IgG: <135 index — ABNORMAL LOW (ref >165)
WBC: 11.9 10*3/uL — ABNORMAL HIGH (ref 3.4–10.8)

## 2019-06-16 LAB — CYTOLOGY - PAP
Chlamydia: NEGATIVE
Comment: NEGATIVE
Comment: NORMAL
Diagnosis: NEGATIVE
Neisseria Gonorrhea: NEGATIVE

## 2019-06-16 LAB — URINE CULTURE

## 2019-07-12 ENCOUNTER — Ambulatory Visit (INDEPENDENT_AMBULATORY_CARE_PROVIDER_SITE_OTHER): Payer: Medicaid Other | Admitting: Obstetrics & Gynecology

## 2019-07-12 ENCOUNTER — Other Ambulatory Visit: Payer: Self-pay

## 2019-07-12 ENCOUNTER — Encounter: Payer: Self-pay | Admitting: Obstetrics & Gynecology

## 2019-07-12 VITALS — BP 100/60 | Wt 203.0 lb

## 2019-07-12 DIAGNOSIS — Z3A13 13 weeks gestation of pregnancy: Secondary | ICD-10-CM

## 2019-07-12 DIAGNOSIS — Z3689 Encounter for other specified antenatal screening: Secondary | ICD-10-CM

## 2019-07-12 DIAGNOSIS — Z3492 Encounter for supervision of normal pregnancy, unspecified, second trimester: Secondary | ICD-10-CM

## 2019-07-12 LAB — POCT URINALYSIS DIPSTICK OB
Glucose, UA: NEGATIVE
POC,PROTEIN,UA: NEGATIVE

## 2019-07-12 NOTE — Progress Notes (Signed)
  Subjective  Fetal Movement? no Nausea? mild Not on UC Meds or Cymbalta at this time, due to ins and costs  Objective  BP 100/60   Wt 203 lb (92.1 kg)   LMP 04/02/2019   BMI 37.13 kg/m  General: NAD Pumonary: no increased work of breathing Abdomen: gravid, non-tender Extremities: no edema Psychiatric: mood appropriate, affect full  Assessment  29 y.o. G2P0010 at 76w3dby  01/14/2020, by Ultrasound presenting for routine prenatal visit  Plan   Problem List Items Addressed This Visit         Encounter for supervision of low-risk pregnancy, antepartum    PNV    UK[place]planned 19 weeks     Declined NIPT     Screening, antenatal, for fetal anatomic survey       Relevant Orders   UK[place]OB Comp + 14 Wk   [redacted] weeks gestation of pregnancy        The following were addressed during this visit:  18-21 weeks - Ultrasound  Planned for 12weeks   PBarnett Applebaum MD, FLoura PardonOb/Gyn, [facility] 07/12/2019  10:30 AM

## 2019-07-12 NOTE — Patient Instructions (Signed)

## 2019-07-12 NOTE — Addendum Note (Signed)
Addended by: Quintella Baton D on: 07/12/2019 10:45 AM   Modules accepted: Orders

## 2019-08-08 NOTE — Telephone Encounter (Signed)
Keep appt tomorrow but also considering meeting w Ulcerative Colitis physician as she may need to go back on meds.  Some patients have worsening of UC during pregnancy.

## 2019-08-09 ENCOUNTER — Other Ambulatory Visit: Payer: Self-pay

## 2019-08-09 ENCOUNTER — Encounter: Payer: Self-pay | Admitting: Obstetrics & Gynecology

## 2019-08-09 ENCOUNTER — Ambulatory Visit (INDEPENDENT_AMBULATORY_CARE_PROVIDER_SITE_OTHER): Payer: Medicaid Other | Admitting: Obstetrics & Gynecology

## 2019-08-09 VITALS — BP 120/80 | Wt 199.0 lb

## 2019-08-09 DIAGNOSIS — Z3492 Encounter for supervision of normal pregnancy, unspecified, second trimester: Secondary | ICD-10-CM

## 2019-08-09 DIAGNOSIS — Z3A17 17 weeks gestation of pregnancy: Secondary | ICD-10-CM

## 2019-08-09 MED ORDER — DOXYLAMINE-PYRIDOXINE 10-10 MG PO TBEC: 2.0000 | DELAYED_RELEASE_TABLET | Freq: Every day | ORAL | 5 refills | Status: DC

## 2019-08-09 NOTE — Patient Instructions (Signed)
Diet for Ulcerative Colitis (UC) and Irritable Bowel Syndrome When you have UC or irritable bowel syndrome (IBS), it is very important to eat the foods and follow the eating habits that are best for your condition. UC and IBS may cause various symptoms such as pain in the abdomen, constipation, or diarrhea. Choosing the right foods can help to ease the discomfort from these symptoms. Work with your health care provider and diet and nutrition specialist (dietitian) to find the eating plan that will help to control your symptoms. What are tips for following this plan?      Keep a food diary. This will help you identify foods that cause symptoms. Write down: ? What you eat and when you eat it. ? What symptoms you have. ? When symptoms occur in relation to your meals, such as "pain in abdomen 2 hours after dinner."  Eat your meals slowly and in a relaxed setting.  Aim to eat 5-6 small meals per day. Do not skip meals.  Drink enough fluid to keep your urine pale yellow.  Ask your health care provider if you should take an over-the-counter probiotic to help restore healthy bacteria in your gut (digestive tract). ? Probiotics are foods that contain good bacteria and yeasts.  Your dietitian may have specific dietary recommendations for you based on your symptoms. He or she may recommend that you: ? Avoid foods that cause symptoms. Talk with your dietitian about other ways to get the same nutrients that are in those problem foods. ? Avoid foods with gluten. Gluten is a protein that is found in rye, wheat, and barley. ? Eat more foods that contain soluble fiber. Examples of foods with high soluble fiber include oats, seeds, and certain fruits and vegetables. Take a fiber supplement if directed by your dietitian. ? Reduce or avoid certain foods called FODMAPs. These are foods that contain carbohydrates that are hard to digest. Ask your doctor which foods contain these carbohydrates. What foods are not  recommended? The following are some foods and drinks that may make your symptoms worse:  Fatty foods, such as french fries.  Foods that contain gluten, such as pasta and cereal.  Dairy products, such as milk, cheese, and ice cream.  Chocolate.  Alcohol.  Products with caffeine, such as coffee.  Carbonated drinks, such as soda.  Foods that are high in FODMAPs. These include certain fruits and vegetables.  Products with sweeteners such as honey, high fructose corn syrup, sorbitol, and mannitol. The items listed above may not be a complete list of foods and beverages you should avoid. Contact a dietitian for more information. What foods are good sources of fiber? Your health care provider or dietitian may recommend that you eat more foods that contain fiber. Fiber can help to reduce constipation and other IBS symptoms. Add foods with fiber to your diet a little at a time so your body can get used to them. Too much fiber at one time might cause gas and swelling of your abdomen. The following are some foods that are good sources of fiber:  Berries, such as raspberries, strawberries, and blueberries.  Tomatoes.  Carrots.  Brown rice.  Oats.  Seeds, such as chia and pumpkin seeds. The items listed above may not be a complete list of recommended sources of fiber. Contact your dietitian for more options. Where to find more information  International Foundation for Functional Gastrointestinal Disorders: www.iffgd.[employer] of Diabetes and Digestive and Kidney Diseases: [employer] Summary  When  you have UC or irritable bowel syndrome (IBS), it is very important to eat the foods and follow the eating habits that are best for your condition.  UC and IBS may cause various symptoms such as pain in the abdomen, constipation, or diarrhea.  Choosing the right foods can help to ease the discomfort that comes from symptoms.  Keep a food diary. This will help you  identify foods that cause symptoms.  Your health care provider or diet and nutrition specialist (dietitian) may recommend that you eat more foods that contain fiber. This information is not intended to replace advice given to you by your health care provider. Make sure you discuss any questions you have with your health care provider. Document Revised: 07/06/2018 Document Reviewed: 11/17/2016 Elsevier Patient Education  [place].

## 2019-08-09 NOTE — Progress Notes (Signed)
  Subjective  Fetal Movement? yes Contractions? no Leaking Fluid? no Vaginal Bleeding? no Worsening GI sx's related to UC, and nausea no vomiting    Starting UC meds in June when new insurance kicks in  Objective  BP 120/80   Wt 199 lb (90.3 kg)   LMP 04/02/2019   BMI 36.40 kg/m  General: NAD Pumonary: no increased work of breathing Abdomen: gravid, non-tender Extremities: no edema Psychiatric: mood appropriate, affect full  Assessment  29 y.o. G2P0010 at 45w3dby  01/14/2020, by Ultrasound presenting for routine prenatal visit  Plan   Problem List Items Addressed This Visit    None    Visit Diagnoses    [redacted] weeks gestation of pregnancy    -  Primary   Encounter for supervision of low-risk pregnancy in second trimester        Diclegis [place] Start UC meds soon.  She still sees GI for this  PBarnett Applebaum MD, FLoura PardonOb/Gyn, [facility] 08/09/2019  10:46 AM

## 2019-08-17 NOTE — Telephone Encounter (Signed)
Have her start w her PCP, as this has many directions it could go

## 2019-08-25 ENCOUNTER — Ambulatory Visit (INDEPENDENT_AMBULATORY_CARE_PROVIDER_SITE_OTHER): Payer: Medicaid Other

## 2019-08-25 ENCOUNTER — Other Ambulatory Visit: Payer: Self-pay

## 2019-08-25 ENCOUNTER — Encounter: Payer: Self-pay | Admitting: Obstetrics & Gynecology

## 2019-08-25 ENCOUNTER — Ambulatory Visit (INDEPENDENT_AMBULATORY_CARE_PROVIDER_SITE_OTHER): Payer: Medicaid Other | Admitting: Obstetrics & Gynecology

## 2019-08-25 VITALS — BP 120/80 | Wt 200.0 lb

## 2019-08-25 DIAGNOSIS — Z131 Encounter for screening for diabetes mellitus: Secondary | ICD-10-CM

## 2019-08-25 DIAGNOSIS — Z3A19 19 weeks gestation of pregnancy: Secondary | ICD-10-CM

## 2019-08-25 DIAGNOSIS — Z3689 Encounter for other specified antenatal screening: Secondary | ICD-10-CM | POA: Diagnosis not present

## 2019-08-25 DIAGNOSIS — Z3492 Encounter for supervision of normal pregnancy, unspecified, second trimester: Secondary | ICD-10-CM

## 2019-08-25 LAB — POCT URINALYSIS DIPSTICK OB
Glucose, UA: NEGATIVE
POC,PROTEIN,UA: NEGATIVE

## 2019-08-25 NOTE — Patient Instructions (Signed)

## 2019-08-25 NOTE — Addendum Note (Signed)
Addended by: Quintella Baton D on: 08/25/2019 11:29 AM   Modules accepted: Orders

## 2019-08-25 NOTE — Progress Notes (Signed)
  Subjective  Fetal Movement? yes Contractions? no Leaking Fluid? no Vaginal Bleeding? no  Objective  BP 120/80   Wt 200 lb (90.7 kg)   LMP 04/02/2019   BMI 36.58 kg/m  General: NAD Pumonary: no increased work of breathing Abdomen: gravid, non-tender Extremities: no edema Psychiatric: mood appropriate, affect full  Assessment  29 y.o. G2P0010 at 36w5dby  01/14/2020, by Ultrasound presenting for routine prenatal visit  Plan   Problem List Items Addressed This Visit    [redacted] weeks gestation of pregnancy        PNV   Encounter for supervision of low-risk pregnancy in second trimester       Screening for diabetes mellitus       Relevant Orders   28 Weeks RH-Panel ordered for 28 weeks    Review of ULTRASOUND. [facility] personally [facility] is [facility] gestation with subjectively normal amniotic fluid volume. The fetal biometry correlates with established dating. Detailed evaluation of the fetal anatomy was performed.The fetal anatomical survey appears within normal limits within the resolution of ultrasound as described above.  It must be noted that a normal ultrasound is unable to rule out fetal aneuploidy.    PBarnett Applebaum MD, FLoura PardonOb/Gyn, [facility] 08/25/2019  11:16 AM

## 2019-09-07 NOTE — Telephone Encounter (Signed)
Please advise 

## 2019-09-20 ENCOUNTER — Ambulatory Visit (INDEPENDENT_AMBULATORY_CARE_PROVIDER_SITE_OTHER): Payer: Medicaid Other | Admitting: Advanced Practice Midwife

## 2019-09-20 ENCOUNTER — Other Ambulatory Visit: Payer: Self-pay

## 2019-09-20 ENCOUNTER — Encounter: Payer: Self-pay | Admitting: Advanced Practice Midwife

## 2019-09-20 VITALS — BP 126/84 | Wt 200.0 lb

## 2019-09-20 DIAGNOSIS — Z3492 Encounter for supervision of normal pregnancy, unspecified, second trimester: Secondary | ICD-10-CM

## 2019-09-20 DIAGNOSIS — Z113 Encounter for screening for infections with a predominantly sexual mode of transmission: Secondary | ICD-10-CM

## 2019-09-20 DIAGNOSIS — Z131 Encounter for screening for diabetes mellitus: Secondary | ICD-10-CM

## 2019-09-20 DIAGNOSIS — Z130 Encounter for screening for diseases of the blood and blood-forming organs and certain disorders involving the immune mechanism: Secondary | ICD-10-CM

## 2019-09-20 DIAGNOSIS — Z3A23 23 weeks gestation of pregnancy: Secondary | ICD-10-CM

## 2019-09-20 NOTE — Progress Notes (Signed)
  Routine Prenatal Care Visit  Subjective  Stephanie Ayala is a 29 y.o. G2P0010 at 28w3dbeing seen today for ongoing prenatal care.  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  ----------------------------------------------------------------------------------- Patient reports she has not yet started her ulcerative colitis medication and plans to pick up today. She has been having flare-ups of her symptoms including digestive issues, rectal bleeding.  It can be challenging for her to find foods that are not aggravating. She has some lightheadedness and reports issues with blood sugar.  . Vag. Bleeding: None.  Movement: Present. Leaking Fluid denies.  ----------------------------------------------------------------------------------- The following portions of the patient's history were reviewed and updated as appropriate: allergies, current medications, past family history, past medical history, past social history, past surgical history and problem list. Problem list updated.  Objective  Blood pressure 126/84, weight 200 lb (90.7 kg), last menstrual period 04/02/2019. Pregravid weight 194 lb (88 kg) Total Weight Gain 6 lb (2.722 kg) Urinalysis: Urine Protein    Urine Glucose    Fetal Status: Fetal Heart Rate (bpm): 142 Fundal Height: 23 cm Movement: Present     General:  Alert, oriented and cooperative. Patient is in no acute distress.  Skin: Skin is warm and dry. No rash noted.   Cardiovascular: Normal heart rate noted  Respiratory: Normal respiratory effort, no problems with respiration noted  Abdomen: Soft, gravid, appropriate for gestational age. Pain/Pressure: Absent     Pelvic:   Cervical exam deferred        Extremities: Normal range of motion.     Mental Status: Normal mood and affect. Normal behavior. Normal judgment and thought content.   Assessment   29y.o. G2P0010 at 254w3dy  01/14/2020, by Ultrasound presenting for routine prenatal visit  Plan   Lightheaded: eat protein with each meal/snack, stay well hydrated   Preterm labor symptoms and general obstetric precautions including but not limited to vaginal bleeding, contractions, leaking of fluid and fetal movement were reviewed in detail with the patient.   Return in about 4 weeks (around 10/18/2019) for 28 wk labs and rob.  JaRod CanCNM 09/20/2019 11:54 AM

## 2019-09-20 NOTE — Progress Notes (Signed)
No vb. No lof.

## 2019-10-06 ENCOUNTER — Telehealth: Payer: Self-pay

## 2019-10-06 NOTE — Telephone Encounter (Signed)
Pt calling; is [redacted]w[redacted]d has sore throat and runny nose; what can she take otc?  [phone] warm salt water gargles, sucrets, chloraseptic spray, hot beverages/soup, e.s. tylenol, plain sudafed.  If cough develops, plain robitussin/plain musinex.

## 2019-10-18 ENCOUNTER — Encounter: Payer: Self-pay | Admitting: Obstetrics and Gynecology

## 2019-10-18 ENCOUNTER — Other Ambulatory Visit: Payer: Self-pay

## 2019-10-18 ENCOUNTER — Ambulatory Visit (INDEPENDENT_AMBULATORY_CARE_PROVIDER_SITE_OTHER): Payer: 59 | Admitting: Obstetrics and Gynecology

## 2019-10-18 ENCOUNTER — Other Ambulatory Visit: Payer: 59

## 2019-10-18 VITALS — BP 116/70 | Ht 62.0 in | Wt 197.6 lb

## 2019-10-18 DIAGNOSIS — Z3490 Encounter for supervision of normal pregnancy, unspecified, unspecified trimester: Secondary | ICD-10-CM

## 2019-10-18 DIAGNOSIS — Z3A27 27 weeks gestation of pregnancy: Secondary | ICD-10-CM

## 2019-10-18 DIAGNOSIS — Z3492 Encounter for supervision of normal pregnancy, unspecified, second trimester: Secondary | ICD-10-CM

## 2019-10-18 DIAGNOSIS — F322 Major depressive disorder, single episode, severe without psychotic features: Secondary | ICD-10-CM

## 2019-10-18 DIAGNOSIS — O0990 Supervision of high risk pregnancy, unspecified, unspecified trimester: Secondary | ICD-10-CM

## 2019-10-18 DIAGNOSIS — M797 Fibromyalgia: Secondary | ICD-10-CM

## 2019-10-18 DIAGNOSIS — O26843 Uterine size-date discrepancy, third trimester: Secondary | ICD-10-CM

## 2019-10-18 DIAGNOSIS — K51211 Ulcerative (chronic) proctitis with rectal bleeding: Secondary | ICD-10-CM

## 2019-10-18 DIAGNOSIS — Z113 Encounter for screening for infections with a predominantly sexual mode of transmission: Secondary | ICD-10-CM

## 2019-10-18 DIAGNOSIS — Z131 Encounter for screening for diabetes mellitus: Secondary | ICD-10-CM

## 2019-10-18 DIAGNOSIS — Z130 Encounter for screening for diseases of the blood and blood-forming organs and certain disorders involving the immune mechanism: Secondary | ICD-10-CM

## 2019-10-18 DIAGNOSIS — K51011 Ulcerative (chronic) pancolitis with rectal bleeding: Secondary | ICD-10-CM

## 2019-10-18 MED ORDER — DULOXETINE HCL 60 MG PO CPEP: 60.0000 mg | ORAL_CAPSULE | Freq: Every day | ORAL | 11 refills | Status: DC

## 2019-10-18 MED ORDER — RHO D IMMUNE GLOBULIN 1500 UNIT/2ML IJ SOSY
300.0000 ug | PREFILLED_SYRINGE | Freq: Once | INTRAMUSCULAR | Status: AC
  Administered 2019-10-18: 300 ug via INTRAMUSCULAR

## 2019-10-18 NOTE — Patient Instructions (Signed)
[place], [place] Same Day Access Hours:  Monday, Wednesday and Friday, 8am - 3pm Walk-In Crisis Hours: 7 days/wk, 8am - 8pm [address], [place], [place] [address] [phone]  [employer] [phone]  Mobile Crisis Unit [phone]   Therapists/Counselors/Psychologists  [facility] [address], [facility] [address] [place], [place] [address] [phone]  Karen [place], [place]    [phone]        [place], [place] [address]        Peggye Ley, [employer] [phone] [place], [place] [address]  Lady Deutscher, [place]        [phone]        [address]      [place], [place] [address]        Bettey Mare, [place] [phone] [address] [place], [place] [address]   Dimas Aguas, LMFT       [phone]        [address], [place] [address]        Miguel Dibble [phone] [address] [place], [place] [address]  Rhona Raider        [phone]        [address]       [place], [place] [address]        Sena Hitch [phone] [address] [place], [place] [address]  Lestine Box, [place]       [phone]        [address]       [place], [place] [address]        Marjie Skiff, [place] [phone] [address]  [place], [place] [address]   Einar Gip        [phone]        [address] [place],  [address]         [place] [phone] [email or web address]   Sation Konchella       [phone]        [address],  [address]        [place] [phone] [email or web address]   Living With Depression Everyone experiences occasional disappointment, sadness, and loss in their lives. When you are feeling down, blue, or sad for at least 2  weeks in a row, it may mean that you have depression. Depression can affect your thoughts and feelings, relationships, daily activities, and physical health. It is caused by changes in the way your brain functions. If you receive a diagnosis of depression, your health care provider will tell you which type of depression you have and what treatment options are available to you. If you are living with depression, there are ways to help you recover from it and also ways to prevent it from coming back. How to cope with lifestyle changes Coping with stress     Stress is your body's reaction to life changes and events, both good and bad. Stressful situations may include:  Getting married.  The death of a spouse.  Losing a job.  Retiring.  Having a baby. Stress can last just a few hours or it can be ongoing. Stress can play a major role in depression, so it  is important to learn both how to cope with stress and how to think about it differently. Talk with your health care provider or a counselor if you would like to learn more about stress reduction. He or she may suggest some stress reduction techniques, such as:  Music therapy. This can include creating music or listening to music. Choose music that you enjoy and that inspires you.  Mindfulness-based meditation. This kind of meditation can be done while sitting or walking. It involves being aware of your normal breaths, rather than trying to control your breathing.  Centering prayer. This is a kind of meditation that involves focusing on a spiritual word or phrase. Choose a word, phrase, or sacred image that is meaningful to you and that brings you peace.  Deep breathing. To do this, expand your stomach and inhale slowly through your nose. Hold your breath for 3-5 seconds, then exhale slowly, allowing your stomach muscles to relax.  Muscle relaxation. This involves intentionally tensing muscles then relaxing them. Choose a stress reduction  technique that fits your lifestyle and personality. Stress reduction techniques take time and practice to develop. Set aside 5-15 minutes a day to do them. Therapists can offer training in these techniques. The training may be covered by some insurance plans. Other things you can do to manage stress include:  Keeping a stress diary. This can help you learn what triggers your stress and ways to control your response.  Understanding what your limits are and saying no to requests or events that lead to a schedule that is too full.  Thinking about how you respond to certain situations. You may not be able to control everything, but you can control how you react.  Adding humor to your life by watching funny films or TV shows.  Making time for activities that help you relax and not feeling guilty about spending your time this way.  Medicines Your health care provider may suggest certain medicines if he or she feels that they will help improve your condition. Avoid using alcohol and other substances that may prevent your medicines from working properly (may interact). It is also important to:  Talk with your pharmacist or health care provider about all the medicines that you take, their possible side effects, and what medicines are safe to take together.  Make it your goal to take part in all treatment decisions (shared decision-making). This includes giving input on the side effects of medicines. It is best if shared decision-making with your health care provider is part of your total treatment plan. If your health care provider prescribes a medicine, you may not notice the full benefits of it for 4-8 weeks. Most people who are treated for depression need to be on medicine for at least 6-12 months after they feel better. If you are taking medicines as part of your treatment, do not stop taking medicines without first talking to your health care provider. You may need to have the medicine slowly decreased  (tapered) over time to decrease the risk of harmful side effects. Relationships Your health care provider may suggest family therapy along with individual therapy and drug therapy. While there may not be family problems that are causing you to feel depressed, it is still important to make sure your family learns as much as they can about your mental health. Having your family's support can help make your treatment successful. How to recognize changes in your condition Everyone has a different response to treatment for depression. Recovery  from major depression happens when you have not had signs of major depression for two months. This may mean that you will start to:  Have more interest in doing activities.  Feel less hopeless than you did 2 months ago.  Have more energy.  Overeat less often, or have better or improving appetite.  Have better concentration. Your health care provider will work with you to decide the next steps in your recovery. It is also important to recognize when your condition is getting worse. Watch for these signs:  Having fatigue or low energy.  Eating too much or too little.  Sleeping too much or too little.  Feeling restless, agitated, or hopeless.  Having trouble concentrating or making decisions.  Having unexplained physical complaints.  Feeling irritable, angry, or aggressive. Get help as soon as you or your family members notice these symptoms coming back. How to get support and help from others How to talk with friends and family members about your condition  Talking to friends and family members about your condition can provide you with one way to get support and guidance. Reach out to trusted friends or family members, explain your symptoms to them, and let them know that you are working with a health care provider to treat your depression. Financial resources Not all insurance plans cover mental health care, so it is important to check with your  insurance carrier. If paying for co-pays or counseling services is a problem, search for a local or county mental health care center. They may be able to offer public mental health care services at low or no cost when you are not able to see a private health care provider. If you are taking medicine for depression, you may be able to get the generic form, which may be less expensive. Some makers of prescription medicines also offer help to patients who cannot afford the medicines they need. Follow these instructions at home:   Get the right amount and quality of sleep.  Cut down on using caffeine, tobacco, alcohol, and other potentially harmful substances.  Try to exercise, such as walking or lifting small weights.  Take over-the-counter and prescription medicines only as told by your health care provider.  Eat a healthy diet that includes plenty of vegetables, fruits, whole grains, low-fat dairy products, and lean protein. Do not eat a lot of foods that are high in solid fats, added sugars, or salt.  Keep all follow-up visits as told by your health care provider. This is important. Contact a health care provider if:  You stop taking your antidepressant medicines, and you have any of these symptoms: ? Nausea. ? Headache. ? Feeling lightheaded. ? Chills and body aches. ? Not being able to sleep (insomnia).  You or your friends and family think your depression is getting worse. Get help right away if:  You have thoughts of hurting yourself or others. If you ever feel like you may hurt yourself or others, or have thoughts about taking your own life, get help right away. You can go to your nearest emergency department or call:  Your local emergency services (911 in the [place]  A suicide crisis helpline, such as the [facility] at 443-867-1305. This is open 24-hours a day. Summary  If you are living with depression, there are ways to help you recover from it  and also ways to prevent it from coming back.  Work with your health care team to create a management plan that includes  counseling, stress management techniques, and healthy lifestyle habits. This information is not intended to replace advice given to you by your health care provider. Make sure you discuss any questions you have with your health care provider. Document Revised: 07/08/2018 Document Reviewed: 02/17/2016 Elsevier Patient Education  [place] of Pregnancy The third trimester is from week 28 through week 40 (months 7 through 9). The third trimester is a time when the unborn baby (fetus) is growing rapidly. At the end of the ninth month, the fetus is about 20 inches in length and weighs 6-10 pounds. Body changes during your third trimester Your body will continue to go through many changes during pregnancy. The changes vary from woman to woman. During the third trimester:  Your weight will continue to increase. You can expect to gain 25-35 pounds (11-16 kg) by the end of the pregnancy.  You may begin to get stretch marks on your hips, abdomen, and breasts.  You may urinate more often because the fetus is moving lower into your pelvis and pressing on your bladder.  You may develop or continue to have heartburn. This is caused by increased hormones that slow down muscles in the digestive tract.  You may develop or continue to have constipation because increased hormones slow digestion and cause the muscles that push waste through your intestines to relax.  You may develop hemorrhoids. These are swollen veins (varicose veins) in the rectum that can itch or be painful.  You may develop swollen, bulging veins (varicose veins) in your legs.  You may have increased body aches in the pelvis, back, or thighs. This is due to weight gain and increased hormones that are relaxing your joints.  You may have changes in your hair. These can include thickening of  your hair, rapid growth, and changes in texture. Some women also have hair loss during or after pregnancy, or hair that feels dry or thin. Your hair will most likely return to normal after your baby is born.  Your breasts will continue to grow and they will continue to become tender. A yellow fluid (colostrum) may leak from your breasts. This is the first milk you are producing for your baby.  Your belly button may stick out.  You may notice more swelling in your hands, face, or ankles.  You may have increased tingling or numbness in your hands, arms, and legs. The skin on your belly may also feel numb.  You may feel short of breath because of your expanding uterus.  You may have more problems sleeping. This can be caused by the size of your belly, increased need to urinate, and an increase in your body's metabolism.  You may notice the fetus "dropping," or moving lower in your abdomen (lightening).  You may have increased vaginal discharge.  You may notice your joints feel loose and you may have pain around your pelvic bone. What to expect at prenatal visits You will have prenatal exams every 2 weeks until week 36. Then you will have weekly prenatal exams. During a routine prenatal visit:  You will be weighed to make sure you and the baby are growing normally.  Your blood pressure will be taken.  Your abdomen will be measured to track your baby's growth.  The fetal heartbeat will be listened to.  Any test results from the previous visit will be discussed.  You may have a cervical check near your due date to see if your cervix has softened  or thinned (effaced).  You will be tested for Group B streptococcus. This happens between 35 and 37 weeks. Your health care provider may ask you:  What your birth plan is.  How you are feeling.  If you are feeling the baby move.  If you have had any abnormal symptoms, such as leaking fluid, bleeding, severe headaches, or abdominal  cramping.  If you are using any tobacco products, including cigarettes, chewing tobacco, and electronic cigarettes.  If you have any questions. Other tests or screenings that may be performed during your third trimester include:  Blood tests that check for low iron levels (anemia).  Fetal testing to check the health, activity level, and growth of the fetus. Testing is done if you have certain medical conditions or if there are problems during the pregnancy.  Nonstress test (NST). This test checks the health of your baby to make sure there are no signs of problems, such as the baby not getting enough oxygen. During this test, a belt is placed around your belly. The baby is made to move, and its heart rate is monitored during movement. What is false labor? False labor is a condition in which you feel small, irregular tightenings of the muscles in the womb (contractions) that usually go away with rest, changing position, or drinking water. These are called Braxton Hicks contractions. Contractions may last for hours, days, or even weeks before true labor sets in. If contractions come at regular intervals, become more frequent, increase in intensity, or become painful, you should see your health care provider. What are the signs of labor?  Abdominal cramps.  Regular contractions that start at 10 minutes apart and become stronger and more frequent with time.  Contractions that start on the top of the uterus and spread down to the lower abdomen and back.  Increased pelvic pressure and dull back pain.  A watery or bloody mucus discharge that comes from the vagina.  Leaking of amniotic fluid. This is also known as your "water breaking." It could be a slow trickle or a gush. Let your health care provider know if it has a color or strange odor. If you have any of these signs, call your health care provider right away, even if it is before your due date. Follow these instructions at  home: Medicines  Follow your health care provider's instructions regarding medicine use. Specific medicines may be either safe or unsafe to take during pregnancy.  Take a prenatal vitamin that contains at least 600 micrograms (mcg) of folic acid.  If you develop constipation, try taking a stool softener if your health care provider approves. Eating and drinking   Eat a balanced diet that includes fresh fruits and vegetables, whole grains, good sources of protein such as meat, eggs, or tofu, and low-fat dairy. Your health care provider will help you determine the amount of weight gain that is right for you.  Avoid raw meat and uncooked cheese. These carry germs that can cause birth defects in the baby.  If you have low calcium intake from food, talk to your health care provider about whether you should take a daily calcium supplement.  Eat four or five small meals rather than three large meals a day.  Limit foods that are high in fat and processed sugars, such as fried and sweet foods.  To prevent constipation: ? Drink enough fluid to keep your urine clear or pale yellow. ? Eat foods that are high in fiber, such as fresh fruits  and vegetables, whole grains, and beans. Activity  Exercise only as directed by your health care provider. Most women can continue their usual exercise routine during pregnancy. Try to exercise for 30 minutes at least 5 days a week. Stop exercising if you experience uterine contractions.  Avoid heavy lifting.  Do not exercise in extreme heat or humidity, or at high altitudes.  Wear low-heel, comfortable shoes.  Practice good posture.  You may continue to have sex unless your health care provider tells you otherwise. Relieving pain and discomfort  Take frequent breaks and rest with your legs elevated if you have leg cramps or low back pain.  Take warm sitz baths to soothe any pain or discomfort caused by hemorrhoids. Use hemorrhoid cream if your health  care provider approves.  Wear a good support bra to prevent discomfort from breast tenderness.  If you develop varicose veins: ? Wear support pantyhose or compression stockings as told by your healthcare provider. ? Elevate your feet for 15 minutes, 3-4 times a day. Prenatal care  Write down your questions. Take them to your prenatal visits.  Keep all your prenatal visits as told by your health care provider. This is important. Safety  Wear your seat belt at all times when driving.  Make a list of emergency phone numbers, including numbers for family, friends, the hospital, and police and fire departments. General instructions  Avoid cat litter boxes and soil used by cats. These carry germs that can cause birth defects in the baby. If you have a cat, ask someone to clean the litter box for you.  Do not travel far distances unless it is absolutely necessary and only with the approval of your health care provider.  Do not use hot tubs, steam rooms, or saunas.  Do not drink alcohol.  Do not use any products that contain nicotine or tobacco, such as cigarettes and e-cigarettes. If you need help quitting, ask your health care provider.  Do not use any medicinal herbs or unprescribed drugs. These chemicals affect the formation and growth of the baby.  Do not douche or use tampons or scented sanitary pads.  Do not cross your legs for long periods of time.  To prepare for the arrival of your baby: ? Take prenatal classes to understand, practice, and ask questions about labor and delivery. ? Make a trial run to the hospital. ? Visit the hospital and tour the maternity area. ? Arrange for maternity or paternity leave through employers. ? Arrange for family and friends to take care of pets while you are in the hospital. ? Purchase a rear-facing car seat and make sure you know how to install it in your car. ? Pack your hospital bag. ? Prepare the baby's nursery. Make sure to remove all  pillows and stuffed animals from the baby's crib to prevent suffocation.  Visit your dentist if you have not gone during your pregnancy. Use a soft toothbrush to brush your teeth and be gentle when you floss. Contact a health care provider if:  You are unsure if you are in labor or if your water has broken.  You become dizzy.  You have mild pelvic cramps, pelvic pressure, or nagging pain in your abdominal area.  You have lower back pain.  You have persistent nausea, vomiting, or diarrhea.  You have an unusual or bad smelling vaginal discharge.  You have pain when you urinate. Get help right away if:  Your water breaks before 37 weeks.  You have  regular contractions less than 5 minutes apart before 37 weeks.  You have a fever.  You are leaking fluid from your vagina.  You have spotting or bleeding from your vagina.  You have severe abdominal pain or cramping.  You have rapid weight loss or weight gain.  You have shortness of breath with chest pain.  You notice sudden or extreme swelling of your face, hands, ankles, feet, or legs.  Your baby makes fewer than 10 movements in 2 hours.  You have severe headaches that do not go away when you take medicine.  You have vision changes. Summary  The third trimester is from week 28 through week 40, months 7 through 9. The third trimester is a time when the unborn baby (fetus) is growing rapidly.  During the third trimester, your discomfort may increase as you and your baby continue to gain weight. You may have abdominal, leg, and back pain, sleeping problems, and an increased need to urinate.  During the third trimester your breasts will keep growing and they will continue to become tender. A yellow fluid (colostrum) may leak from your breasts. This is the first milk you are producing for your baby.  False labor is a condition in which you feel small, irregular tightenings of the muscles in the womb (contractions) that  eventually go away. These are called Braxton Hicks contractions. Contractions may last for hours, days, or even weeks before true labor sets in.  Signs of labor can include: abdominal cramps; regular contractions that start at 10 minutes apart and become stronger and more frequent with time; watery or bloody mucus discharge that comes from the vagina; increased pelvic pressure and dull back pain; and leaking of amniotic fluid. This information is not intended to replace advice given to you by your health care provider. Make sure you discuss any questions you have with your health care provider. Document Revised: 07/07/2018 Document Reviewed: 04/21/2016 Elsevier Patient Education  2020 [place].   Ulcerative Colitis, Adult  Ulcerative colitis is long-lasting (chronic) inflammation of the large intestine (colon) and rectum. Sores (ulcers) may also form in these areas. Ulcerative colitis, along with a closely related condition called Crohn's disease, is often referred to as inflammatory bowel disease (IBD). What are the causes? This condition may be caused by increased activity of the immune system in the intestines. The immune system is the system that protects the body against harmful bacteria, viruses, fungi, and other things that can make you sick. The cause of the increased activity of the immune system is not known. What increases the risk? The following factors may make you more likely to develop this condition:  Being 64-72 years old. The risk is also increased for people who are 106-62 years old.  Having a family history of ulcerative colitis.  Being of Jewish descent. What are the signs or symptoms? Symptoms vary depending on how severe the condition is. Common symptoms include:  Rectal bleeding.  Diarrhea, often with blood or pus in the stool. Other symptoms can include:  Pain or cramping in the abdomen.  Fever.  Fatigue.  Weight loss.  Night sweats.  Rectal  pain.  A strong and sudden need to have a bowel movement (bowel urgency).  Nausea.  Loss of appetite.  Anemia.  Yellowing of the skin (jaundice) from liver dysfunction.  Joint pain or soreness.  Eye irritation.  Skin rashes. Symptoms can range from mild to severe. They may come and go. How is this diagnosed? This condition  may be diagnosed based on:  Your symptoms and medical history.  A physical exam.  Tests, including: ? Blood tests and stool tests. ? X-ray. ? A CT scan. ? An MRI. ? Colonoscopy. For this test, a flexible tube is inserted into your anus, and your colon is examined. ? Biopsy. In this test, a tissue sample is taken from your colon and examined under a microscope. How is this treated? Treatment for this condition may include medicines to:  Decrease swelling and inflammation.  Control your immune system.  Treat infections.  Relieve pain.  Control diarrhea. Severe flare-ups may need to be treated at a hospital. Treatment in a hospital may involve:  Resting the bowel. This involves not eating or drinking for a period of time.  Getting medicines through an IV.  Getting fluids and nutrition through: ? An IV. ? A tube that is passed through the nose and into the stomach (nasogastric tube, or NG tube).  Surgery to remove the affected part of the colon. This may be done if other treatments are not helping. This condition increases the risk of colon cancer. Adults with this condition will need to be watched for colon cancer throughout life. Follow these instructions at home: Medicines and vitamins  Take over-the-counter and prescription medicines only as told by your health care provider. Do not take aspirin.  If you were prescribed an antibiotic medicine, take it as told by your health care provider. Do not stop taking the antibiotic even if you start to feel better.  Ask your health care provider if you should take any vitamins or supplements.  You may need to take: ? Calcium and vitamin D for bone health. ? Iron to help treat anemia. Lifestyle  Exercise regularly.  Work with your health care provider to manage your condition and educate yourself about your condition.  Do not use any products that contain nicotine or tobacco, such as cigarettes, e-cigarettes, and chewing tobacco. If you need help quitting, ask your health care provider.  If you drink alcohol: ? Limit how much you use to:  0-1 drink a day for women.  0-2 drinks a day for men. ? Be aware of how much alcohol is in your drink. In the [place] one drink equals one 12 oz bottle of beer (355 mL), one 5 oz glass of wine (148 mL), or one 1 oz glass of hard liquor (44 mL). Eating and drinking  Drink enough fluid to keep your urine pale yellow.  Ask your health care provider about the best diet for you. Follow the diet as told by your health care provider. This may include: ? Avoiding carbonated drinks. ? Avoiding popcorn, vegetable skins, nuts, and other high-fiber foods. ? Avoiding high-fat foods. ? Eating smaller meals more often. ? Limiting your intake of sugary drinks. ? Limiting your caffeine intake.  Follow food safety recommendations as told by your health care provider. This may include making sure you: ? Avoid eating raw or undercooked meat, fish, or eggs. ? Do not eat or drink spoiled or expired foods and drinks.  Keep a food diary. This may help you identify and avoid any foods that trigger your symptoms. General instructions  Wash your hands often with soap and water. If soap and water are not available, use hand sanitizer.  Stay up to date on your vaccinations, including a yearly (annual) flu shot. Ask your health care provider which vaccines you should get.  Follow recommendations from your health care provider for  having cancer screening tests. Ulcerative colitis may place you at increased risk for colon cancer.  Keep all follow-up visits as  told by your health care provider. This is important. Contact a health care provider if:  Your symptoms do not improve or they get worse with treatment.  You continue to lose weight.  You have constant cramps or loose stools.  You develop a new skin rash, skin sores, or eye problems.  You have a fever or chills. Get help right away if:  You have bloody diarrhea.  You have severe bleeding from the rectum.  You feel that your heart is racing (tachycardia).  You have severe pain in your abdomen.  Your abdomen swells (abdominal distension).  Your abdomen is tender to the touch.  You vomit. Summary  Ulcerative colitis is long-lasting (chronic) inflammation of the large intestine (colon) and rectum. Sores (ulcers) may also form in these areas.  Follow instructions from your health care provider about medicines, lifestyle changes, and eating and drinking.  Contact your health care provider if symptoms do not improve or they get worse with treatment.  Get help right away if you have severe abdominal pain, abdominal swelling, or severe bleeding from the rectum.  Keep all follow-up visits as told by your health care provider. This is important. This information is not intended to replace advice given to you by your health care provider. Make sure you discuss any questions you have with your health care provider. Document Revised: 01/03/2018 Document Reviewed: 01/05/2018 Elsevier Patient Education  [place].

## 2019-10-18 NOTE — Progress Notes (Signed)
Routine Prenatal Care Visit  Subjective  Stephanie Ayala is a 29 y.o. G2P0010 at 23w3dbeing seen today for ongoing prenatal care.  She is currently monitored for the following issues for this high-risk pregnancy and has Low grade squamous intraepith lesion on cytologic smear cervix (lgsil); CIN I (cervical intraepithelial neoplasia I); Encounter for supervision of low-risk pregnancy, antepartum; Fibromyalgia; Depressive disorder; and Ulcerative colitis ([place] on their problem list.  ----------------------------------------------------------------------------------- Patient reports that she has not started taking her ulcerative colitis medicine.  She reports that taking it 3 times a day has been too challenging for her.  Patient has a history of depression.  She reports that she was controlled on 60 mg of Cymbalta in the past.  She reports that when she found out that she was pregnancy that she stopped taking this medication.  She reports that for the last week she is been suffering from severe depression.  Her PHQ-9 score is 19.  Her GAD-7 score is 15.  She reports that she is unable to do anything other than lying in bed all day.  She is not eating and not taking her ulcerative colitis medication.  She reports that she has been constipated but continues to have rectal bleeding.  [facility] was interested in changing providers.  She also has a history of fibromyalgia but has not recently seen her rheumatologist this pregnancy.  She reports that she lives at home with her husband and their 6 pets.  She reports that her pets help her greatly and that she has 2 birds 3 dogs and 1 cat.  She reports that her mom and her husband have come in concerned about her mental health recently.  Her husband and her have made a plan for helping her take her medication 3 times a day.  Contractions: Not present. Vag. Bleeding: None.  Movement: Present. Denies leaking  of fluid.  ----------------------------------------------------------------------------------- The following portions of the patient's history were reviewed and updated as appropriate: allergies, current medications, past family history, past medical history, past social history, past surgical history and problem list. Problem list updated.   Objective  Blood pressure 116/70, height 5' 2"  (1.575 m), weight 197 lb 9.6 oz (89.6 kg), last menstrual period 04/02/2019. Pregravid weight 194 lb (88 kg) Total Weight Gain 3 lb 9.6 oz (1.633 kg) Urinalysis:      Fetal Status: Fetal Heart Rate (bpm): 145 Fundal Height: 32 cm Movement: Present     General:  Alert, oriented and cooperative. Patient is in no acute distress.  Skin: Skin is warm and dry. No rash noted.   Cardiovascular: Normal heart rate noted  Respiratory: Normal respiratory effort, no problems with respiration noted  Abdomen: Soft, gravid, appropriate for gestational age. Pain/Pressure: Absent     Pelvic:  Cervical exam deferred        Extremities: Normal range of motion.  Edema: None  Mental Status: Normal mood and affect. Normal behavior. Normal judgment and thought content.     Assessment   29y.o. G2P0010 at 273w3dy  01/14/2020, by Ultrasound presenting for routine prenatal visit  Plan   pregnancy2  Problems (from 04/02/19 to present)    Problem Noted Resolved   Supervision of high risk pregnancy, antepartum 05/26/2019 by HaGae DryMD No   Overview Addendum 10/18/2019 11:41 AM by ScHomero FellersMD    [facility] Prenatal Labs  Dating By [place] wks  Blood type: A/Negative/-- (03/17 1511)   Genetic Screen NIPS:declines Antibody:Negative (03/17 1511)  Anatomic [place] complete Rubella: <0.90 (03/17 1511) Varicella: Non-Immune  GTT Early:104      Third trimester:  RPR: Non Reactive (03/17 1511)   Rhogam  10/18/2019 HBsAg: Negative (03/17 1511)   TDaP vaccine                       Flu Shot: HIV: Non Reactive (03/17  1511)   Baby Food                                GBS:   Contraception  Pap:06/14/2019  CBB  No   CS/VBAC N/A   Support Person Aleene Davidson- husband    Major depression:  Cymbalta restarted 10/18/2019- urgent Psychiatry referral made. Given list of resources.  UC: patient encouraged to restart medication and follow up with [facility].  Fibromyalgia: Patient encouraged to follow up with [facility].        Previous Version      Patient's primary care GI and rheumatologist are all with [facility].  For this reason we will refer patient to [facility].  Encouraged her to make appointments with her GI doctor and her rheumatologist as well.  Made urgent referral to psychiatry through [facility].  Will restart patient on Cymbalta 60 mg daily.  Encourage patient to take this medication to help improve her mental state and improve compliance with her other chronic medical conditions.  Discussed that risk for the fetus are low at this time during pregnancy.  Patient has not tried other assess our eyes in the past but Cymbalta has worked the best to control her symptoms of depression and anxiety.  28-week labs today RhoGam today Uterine size/date discrepancy will have growth ultrasound next visit.  Gestational age appropriate obstetric precautions including but not limited to vaginal bleeding, contractions, leaking of fluid and fetal movement were reviewed in detail with the patient.    Return in about 2 weeks (around 11/01/2019) for [place] with MD in person/ growth AFI [place].  Homero Fellers MD [facility] 10/18/2019, 11:39 AM

## 2019-10-18 NOTE — Progress Notes (Signed)
Pt is getting her Rhogam today.

## 2019-10-19 ENCOUNTER — Other Ambulatory Visit: Payer: Self-pay | Admitting: Obstetrics and Gynecology

## 2019-10-19 DIAGNOSIS — K51011 Ulcerative (chronic) pancolitis with rectal bleeding: Secondary | ICD-10-CM

## 2019-10-19 DIAGNOSIS — M797 Fibromyalgia: Secondary | ICD-10-CM

## 2019-10-19 DIAGNOSIS — O0990 Supervision of high risk pregnancy, unspecified, unspecified trimester: Secondary | ICD-10-CM

## 2019-10-19 LAB — 28 WEEKS RH-PANEL
Antibody Screen: NEGATIVE
Basophils Absolute: 0.1 10*3/uL (ref 0.0–0.2)
Basos: 0 %
EOS (ABSOLUTE): 0.1 10*3/uL (ref 0.0–0.4)
Eos: 0 %
Gestational Diabetes Screen: 143 mg/dL — ABNORMAL HIGH (ref 65–139)
HIV Screen 4th Generation wRfx: NONREACTIVE
Hematocrit: 31.3 % — ABNORMAL LOW (ref 34.0–46.6)
Hemoglobin: 10.5 g/dL — ABNORMAL LOW (ref 11.1–15.9)
Immature Grans (Abs): 0.1 10*3/uL (ref 0.0–0.1)
Immature Granulocytes: 1 %
Lymphocytes Absolute: 2.9 10*3/uL (ref 0.7–3.1)
Lymphs: 18 %
MCH: 28.2 pg (ref 26.6–33.0)
MCHC: 33.5 g/dL (ref 31.5–35.7)
MCV: 84 fL (ref 79–97)
Monocytes Absolute: 1.2 10*3/uL — ABNORMAL HIGH (ref 0.1–0.9)
Monocytes: 8 %
Neutrophils Absolute: 11.7 10*3/uL — ABNORMAL HIGH (ref 1.4–7.0)
Neutrophils: 73 %
Platelets: 435 10*3/uL (ref 150–450)
RBC: 3.73 x10E6/uL — ABNORMAL LOW (ref 3.77–5.28)
RDW: 13.1 % (ref 11.7–15.4)
RPR Ser Ql: NONREACTIVE
WBC: 16 10*3/uL — ABNORMAL HIGH (ref 3.4–10.8)

## 2019-10-22 ENCOUNTER — Other Ambulatory Visit: Payer: Self-pay | Admitting: Advanced Practice Midwife

## 2019-10-22 DIAGNOSIS — R7309 Other abnormal glucose: Secondary | ICD-10-CM

## 2019-10-22 DIAGNOSIS — F32A Depression, unspecified: Secondary | ICD-10-CM

## 2019-10-22 DIAGNOSIS — F329 Major depressive disorder, single episode, unspecified: Secondary | ICD-10-CM

## 2019-10-22 DIAGNOSIS — O0990 Supervision of high risk pregnancy, unspecified, unspecified trimester: Secondary | ICD-10-CM

## 2019-10-22 NOTE — Progress Notes (Signed)
3 hr gtt ordered due to elevated 1 hr gtt. MyChart message sent to schedule 3 hr. Encouraged to continue taking PNV with Fe.

## 2019-10-26 ENCOUNTER — Ambulatory Visit: Payer: Medicaid Other | Attending: Maternal & Fetal Medicine

## 2019-10-26 ENCOUNTER — Other Ambulatory Visit: Payer: Self-pay

## 2019-10-26 ENCOUNTER — Ambulatory Visit (HOSPITAL_BASED_OUTPATIENT_CLINIC_OR_DEPARTMENT_OTHER): Payer: Medicaid Other | Admitting: Maternal & Fetal Medicine

## 2019-10-26 DIAGNOSIS — O99283 Endocrine, nutritional and metabolic diseases complicating pregnancy, third trimester: Secondary | ICD-10-CM | POA: Diagnosis not present

## 2019-10-26 DIAGNOSIS — Z3A28 28 weeks gestation of pregnancy: Secondary | ICD-10-CM | POA: Diagnosis not present

## 2019-10-26 DIAGNOSIS — K5190 Ulcerative colitis, unspecified, without complications: Secondary | ICD-10-CM | POA: Diagnosis not present

## 2019-10-26 DIAGNOSIS — O99353 Diseases of the nervous system complicating pregnancy, third trimester: Secondary | ICD-10-CM | POA: Insufficient documentation

## 2019-10-26 DIAGNOSIS — K51011 Ulcerative (chronic) pancolitis with rectal bleeding: Secondary | ICD-10-CM

## 2019-10-26 DIAGNOSIS — Z3689 Encounter for other specified antenatal screening: Secondary | ICD-10-CM | POA: Diagnosis present

## 2019-10-26 DIAGNOSIS — O99343 Other mental disorders complicating pregnancy, third trimester: Secondary | ICD-10-CM | POA: Insufficient documentation

## 2019-10-26 DIAGNOSIS — M797 Fibromyalgia: Secondary | ICD-10-CM | POA: Diagnosis not present

## 2019-10-26 DIAGNOSIS — F329 Major depressive disorder, single episode, unspecified: Secondary | ICD-10-CM | POA: Insufficient documentation

## 2019-10-26 DIAGNOSIS — O321XX0 Maternal care for breech presentation, not applicable or unspecified: Secondary | ICD-10-CM | POA: Insufficient documentation

## 2019-10-26 DIAGNOSIS — O99613 Diseases of the digestive system complicating pregnancy, third trimester: Secondary | ICD-10-CM | POA: Insufficient documentation

## 2019-10-26 DIAGNOSIS — O0990 Supervision of high risk pregnancy, unspecified, unspecified trimester: Secondary | ICD-10-CM

## 2019-10-27 NOTE — Progress Notes (Signed)
MFM Consultation  Date of Service 10/26/19 Reason for request: Active ulcerative colitis. Requesting provider:Dr. Adrian Prows   Stephanie Ayala is a G2P0 who is here at 58 w 4 days in consultation at the request of Dr. Gilman Schmidt  [name] denies signs or symptoms of preterm labor or preeclampsia.  Her pregnancy issues include:  1) Active ulcerative colitis. Ms. Tolsma notes that her UC has improved significantly over the last 2 months. She has had rectal bleeding and loose stools. She is currently under the care of [facility] with Lovette Cliche, PA. She was treated with Lialda and Rowasa suppositories however due to loss of insurance she had to discontinue these medications. She is now taking Colazal 1000 mg daily at night. She reports less rectal bleeding but some occasionally, less abdominal pain and more formed stools. She has insurance now and feels that she is improving.   Vitals with BMI 10/26/2019 10/18/2019 09/20/2019  Height 5' 2"  5' 2"  -  Weight 195 lbs 8 oz 197 lbs 10 oz 200 lbs  BMI 75.91 63.84 -  Systolic 665 993 570  Diastolic 68 70 84  Pulse 96 - -   CBC Latest Ref Rng & Units 10/18/2019 06/14/2019 03/29/2015  WBC 3.4 - 10.8 x10E3/uL 16.0(H) 11.9(H) 10.4  Hemoglobin 11.1 - 15.9 g/dL 10.5(L) 12.6 12.4  Hematocrit 34.0 - 46.6 % 31.3(L) 37.1 36.7  Platelets 150 - 450 x10E3/uL 435 423 174   CMP Latest Ref Rng & Units 03/29/2015  Glucose 65 - 99 mg/dL 94  BUN 6 - 20 mg/dL 7  Creatinine 0.44 - 1.00 mg/dL 0.65  Sodium 135 - 145 mmol/L 135  Potassium 3.5 - 5.1 mmol/L 3.9  Chloride 101 - 111 mmol/L 103  CO2 22 - 32 mmol/L 26  Calcium 8.9 - 10.3 mg/dL 8.6(L)  Total Protein 6.5 - 8.1 g/dL 7.1  Total Bilirubin 0.3 - 1.2 mg/dL 1.5(H)  Alkaline Phos 38 - 126 U/L 86  AST 15 - 41 U/L 134(H)  ALT 14 - 54 U/L 116(H)   OB History  Gravida Para Term Preterm AB Living  2       1    SAB TAB Ectopic Multiple Live Births  1            # Outcome Date GA Lbr Len/2nd Weight Sex  Delivery Anes PTL Lv  2 Current           1 SAB            Past Medical History:  Diagnosis Date  . Autoimmune disease ([place])   . Colitis   . Depression   . Fibromyalgia    Past Surgical History:  Procedure Laterality Date  . OVARY SURGERY Bilateral    cyst removal   Social History   Socioeconomic History  . Marital status: Single    Spouse name: Not on file  . Number of children: Not on file  . Years of education: Not on file  . Highest education level: Not on file  Occupational History  . Occupation: Not employed  Tobacco Use  . Smoking status: Former Smoker    Packs/day: 0.00  . Smokeless tobacco: Never Used  Vaping Use  . Vaping Use: Every day  Substance and Sexual Activity  . Alcohol use: Not Currently  . Drug use: No  . Sexual activity: Yes    Birth control/protection: I.U.D.  Other Topics Concern  . Not on file  Social History Narrative  . Not on file  Social Determinants of Health   Financial Resource Strain:   . Difficulty of Paying Living Expenses:   Food Insecurity:   . Worried About Charity fundraiser in the Last Year:   . Arboriculturist in the Last Year:   Transportation Needs:   . Film/video editor (Medical):   Marland Kitchen Lack of Transportation (Non-Medical):   Physical Activity:   . Days of Exercise per Week:   . Minutes of Exercise per Session:   Stress:   . Feeling of Stress :   Social Connections:   . Frequency of Communication with Friends and Family:   . Frequency of Social Gatherings with Friends and Family:   . Attends Religious Services:   . Active Member of Clubs or Organizations:   . Attends Archivist Meetings:   Marland Kitchen Marital Status:   Intimate Partner Violence:   . Fear of Current or Ex-Partner:   . Emotionally Abused:   Marland Kitchen Physically Abused:   . Sexually Abused:    Family History  Problem Relation Age of Onset  . Diabetes Father   . Hypertension Father   . Diabetes Paternal Uncle   . Heart disease Paternal  Uncle   . Alcohol abuse Maternal Grandfather   . Heart disease Paternal Grandfather    Current Outpatient Medications on File Prior to Visit  Medication Sig Dispense Refill  . balsalazide (COLAZAL) 750 MG capsule Take 2,250 mg by mouth at bedtime.    . cholecalciferol (VITAMIN D3) 25 MCG (1000 UNIT) tablet Take 1,000 Units by mouth daily. Pt unsure of dose.    . DULoxetine (CYMBALTA) 60 MG capsule Take 1 capsule (60 mg total) by mouth daily. 30 capsule 11  . ferrous sulfate 325 (65 FE) MG tablet Take 325 mg by mouth daily with breakfast.    . folic acid (FOLVITE) 1 MG tablet Take 1 mg by mouth daily. Pt unsure of dose.  Will check and notify next visit.    . Prenatal Vit-Fe Fumarate-FA (PRENATAL MULTIVITAMIN) TABS tablet Take 1 tablet by mouth daily at 12 noon.     No current facility-administered medications on file prior to visit.   No Known Allergies    Impression/Counseling:   1) Ulcerative colitis  Ulcerative colitis is characterized by inflammation limited to the mucosal surface of the colon. It always starts at the dentate line of the rectum and extends proximally but involves only the colon. In general, there is an increased risk for preterm birth, low birth weight, and being small for gestational age among infants of mothers with IBD, but no increased risk for congenital anomalies.  Approximately one third of pregnant patients with active ulcerative colitis will experience a flare during pregnancy, which is identical to the annual risk for flaring in nonpregnant women. Active ulcerative colitis at the time of conception tends to remain active, and inactive disease tends to remain inactive.  In general, the rate of delivery of healthy offspring in women with ulcerative colitis has been similar to that of the general population.  However, discordant data has also been reported, mainly dealing with low birth weight and preterm birth.  Whether the degree of disease activity accounts  in part for the differences among the published studies is not known.    Based on the discordant data that exists, we generally recommend serial ultrasounds for growth at approximately 4 week intervals in patients with inflammatory bowel disease.    Women with IBD often require medications to control  the disease. Co-management with a gastroenterologist is recommended. Among the common treatment modalities for IBD, fluoroquinolones and methotrexate are contraindicated in pregnancy. NSAIDs in limited amounts are safe during the 1st and second trimester, but should be discontinued by 28 weeks. Metronidazole, 5-ASA derivatives, and glucocorticoids can generally be continued. Patients taking sulfasalazine should receive folic acid supplementation. Thiopurines (azathioprine and mercaptopurine) as well as cyclosporine can generally be continued as the risk of poorly controlled IBD outweighs the risk of these medications, which have an established record use in pregnancies complicated by autoimmune disease and solid organ transplants. Anti-TNF agents such as infliximab (Remicade) are probably safe in pregnancy, although data are limited. We generally recommend that these drugs be continued through pregnancy to maintain remission.    She is currently taking Colazal there is limited data on this medication. However, there has been no overall adverse outcomes, however there is [place] registry showing and increased risk of cardiac defects. In addition, there are case reports of diarrhea in 8 breast fed infants.  We discussed decreasing dose discussing with pediatricians at the time of delivery. In addition, I explained that given the late gestational age at which she began Colazal the cardiac risk low.  With regard to labor and delivery, in women with irritable bowel disease, a cesarean delivery is only indicated for routine obstetrical indications.  If ulcerative colitis, however, affects the areas around the vagina  or if the woman has an ileoanal pouch, a cesarean delivery may be preferred to reduce the risk of developing fistulas.   I spent 30 minute with > 50% in face to face consultation.   Kennyth Lose, MD

## 2019-11-03 ENCOUNTER — Other Ambulatory Visit: Payer: Self-pay

## 2019-11-03 ENCOUNTER — Ambulatory Visit (INDEPENDENT_AMBULATORY_CARE_PROVIDER_SITE_OTHER): Payer: Medicaid Other | Admitting: Obstetrics & Gynecology

## 2019-11-03 ENCOUNTER — Ambulatory Visit (INDEPENDENT_AMBULATORY_CARE_PROVIDER_SITE_OTHER): Payer: Medicaid Other

## 2019-11-03 ENCOUNTER — Encounter: Payer: Self-pay | Admitting: Obstetrics & Gynecology

## 2019-11-03 ENCOUNTER — Other Ambulatory Visit: Payer: Medicaid Other

## 2019-11-03 VITALS — BP 120/80 | Wt 198.0 lb

## 2019-11-03 DIAGNOSIS — Z3A31 31 weeks gestation of pregnancy: Secondary | ICD-10-CM | POA: Diagnosis not present

## 2019-11-03 DIAGNOSIS — Z3A29 29 weeks gestation of pregnancy: Secondary | ICD-10-CM

## 2019-11-03 DIAGNOSIS — R7309 Other abnormal glucose: Secondary | ICD-10-CM

## 2019-11-03 DIAGNOSIS — O3663X0 Maternal care for excessive fetal growth, third trimester, not applicable or unspecified: Secondary | ICD-10-CM

## 2019-11-03 DIAGNOSIS — O0993 Supervision of high risk pregnancy, unspecified, third trimester: Secondary | ICD-10-CM

## 2019-11-03 DIAGNOSIS — O26843 Uterine size-date discrepancy, third trimester: Secondary | ICD-10-CM | POA: Diagnosis not present

## 2019-11-03 DIAGNOSIS — O0990 Supervision of high risk pregnancy, unspecified, unspecified trimester: Secondary | ICD-10-CM

## 2019-11-03 DIAGNOSIS — K51211 Ulcerative (chronic) proctitis with rectal bleeding: Secondary | ICD-10-CM

## 2019-11-03 NOTE — Patient Instructions (Signed)
Thank you for choosing [facility]. As part of our ongoing efforts to improve patient experience, we would appreciate your feedback. Please fill out the short survey that you will receive by mail or MyChart. Your opinion is important to [place]! -Dr Kenton Kingfisher   Third Trimester of Pregnancy The third trimester is from week 28 through week 40 (months 7 through 9). The third trimester is a time when the unborn baby (fetus) is growing rapidly. At the end of the ninth month, the fetus is about 20 inches in length and weighs 6-10 pounds. Body changes during your third trimester Your body will continue to go through many changes during pregnancy. The changes vary from woman to woman. During the third trimester:  Your weight will continue to increase. You can expect to gain 25-35 pounds (11-16 kg) by the end of the pregnancy.  You may begin to get stretch marks on your hips, abdomen, and breasts.  You may urinate more often because the fetus is moving lower into your pelvis and pressing on your bladder.  You may develop or continue to have heartburn. This is caused by increased hormones that slow down muscles in the digestive tract.  You may develop or continue to have constipation because increased hormones slow digestion and cause the muscles that push waste through your intestines to relax.  You may develop hemorrhoids. These are swollen veins (varicose veins) in the rectum that can itch or be painful.  You may develop swollen, bulging veins (varicose veins) in your legs.  You may have increased body aches in the pelvis, back, or thighs. This is due to weight gain and increased hormones that are relaxing your joints.  You may have changes in your hair. These can include thickening of your hair, rapid growth, and changes in texture. Some women also have hair loss during or after pregnancy, or hair that feels dry or thin. Your hair will most likely return to normal after your baby is born.  Your  breasts will continue to grow and they will continue to become tender. A yellow fluid (colostrum) may leak from your breasts. This is the first milk you are producing for your baby.  Your belly button may stick out.  You may notice more swelling in your hands, face, or ankles.  You may have increased tingling or numbness in your hands, arms, and legs. The skin on your belly may also feel numb.  You may feel short of breath because of your expanding uterus.  You may have more problems sleeping. This can be caused by the size of your belly, increased need to urinate, and an increase in your body's metabolism.  You may notice the fetus "dropping," or moving lower in your abdomen (lightening).  You may have increased vaginal discharge.  You may notice your joints feel loose and you may have pain around your pelvic bone. What to expect at prenatal visits You will have prenatal exams every 2 weeks until week 36. Then you will have weekly prenatal exams. During a routine prenatal visit:  You will be weighed to make sure you and the baby are growing normally.  Your blood pressure will be taken.  Your abdomen will be measured to track your baby's growth.  The fetal heartbeat will be listened to.  Any test results from the previous visit will be discussed.  You may have a cervical check near your due date to see if your cervix has softened or thinned (effaced).  You will be  tested for Group B streptococcus. This happens between 35 and 37 weeks. Your health care provider may ask you:  What your birth plan is.  How you are feeling.  If you are feeling the baby move.  If you have had any abnormal symptoms, such as leaking fluid, bleeding, severe headaches, or abdominal cramping.  If you are using any tobacco products, including cigarettes, chewing tobacco, and electronic cigarettes.  If you have any questions. Other tests or screenings that may be performed during your third  trimester include:  Blood tests that check for low iron levels (anemia).  Fetal testing to check the health, activity level, and growth of the fetus. Testing is done if you have certain medical conditions or if there are problems during the pregnancy.  Nonstress test (NST). This test checks the health of your baby to make sure there are no signs of problems, such as the baby not getting enough oxygen. During this test, a belt is placed around your belly. The baby is made to move, and its heart rate is monitored during movement. What is false labor? False labor is a condition in which you feel small, irregular tightenings of the muscles in the womb (contractions) that usually go away with rest, changing position, or drinking water. These are called Braxton Hicks contractions. Contractions may last for hours, days, or even weeks before true labor sets in. If contractions come at regular intervals, become more frequent, increase in intensity, or become painful, you should see your health care provider. What are the signs of labor?  Abdominal cramps.  Regular contractions that start at 10 minutes apart and become stronger and more frequent with time.  Contractions that start on the top of the uterus and spread down to the lower abdomen and back.  Increased pelvic pressure and dull back pain.  A watery or bloody mucus discharge that comes from the vagina.  Leaking of amniotic fluid. This is also known as your "water breaking." It could be a slow trickle or a gush. Let your health care provider know if it has a color or strange odor. If you have any of these signs, call your health care provider right away, even if it is before your due date. Follow these instructions at home: Medicines  Follow your health care provider's instructions regarding medicine use. Specific medicines may be either safe or unsafe to take during pregnancy.  Take a prenatal vitamin that contains at least 600 micrograms  (mcg) of folic acid.  If you develop constipation, try taking a stool softener if your health care provider approves. Eating and drinking   Eat a balanced diet that includes fresh fruits and vegetables, whole grains, good sources of protein such as meat, eggs, or tofu, and low-fat dairy. Your health care provider will help you determine the amount of weight gain that is right for you.  Avoid raw meat and uncooked cheese. These carry germs that can cause birth defects in the baby.  If you have low calcium intake from food, talk to your health care provider about whether you should take a daily calcium supplement.  Eat four or five small meals rather than three large meals a day.  Limit foods that are high in fat and processed sugars, such as fried and sweet foods.  To prevent constipation: ? Drink enough fluid to keep your urine clear or pale yellow. ? Eat foods that are high in fiber, such as fresh fruits and vegetables, whole grains, and beans. Activity  Exercise only as directed by your health care provider. Most women can continue their usual exercise routine during pregnancy. Try to exercise for 30 minutes at least 5 days a week. Stop exercising if you experience uterine contractions.  Avoid heavy lifting.  Do not exercise in extreme heat or humidity, or at high altitudes.  Wear low-heel, comfortable shoes.  Practice good posture.  You may continue to have sex unless your health care provider tells you otherwise. Relieving pain and discomfort  Take frequent breaks and rest with your legs elevated if you have leg cramps or low back pain.  Take warm sitz baths to soothe any pain or discomfort caused by hemorrhoids. Use hemorrhoid cream if your health care provider approves.  Wear a good support bra to prevent discomfort from breast tenderness.  If you develop varicose veins: ? Wear support pantyhose or compression stockings as told by your healthcare provider. ? Elevate  your feet for 15 minutes, 3-4 times a day. Prenatal care  Write down your questions. Take them to your prenatal visits.  Keep all your prenatal visits as told by your health care provider. This is important. Safety  Wear your seat belt at all times when driving.  Make a list of emergency phone numbers, including numbers for family, friends, the hospital, and police and fire departments. General instructions  Avoid cat litter boxes and soil used by cats. These carry germs that can cause birth defects in the baby. If you have a cat, ask someone to clean the litter box for you.  Do not travel far distances unless it is absolutely necessary and only with the approval of your health care provider.  Do not use hot tubs, steam rooms, or saunas.  Do not drink alcohol.  Do not use any products that contain nicotine or tobacco, such as cigarettes and e-cigarettes. If you need help quitting, ask your health care provider.  Do not use any medicinal herbs or unprescribed drugs. These chemicals affect the formation and growth of the baby.  Do not douche or use tampons or scented sanitary pads.  Do not cross your legs for long periods of time.  To prepare for the arrival of your baby: ? Take prenatal classes to understand, practice, and ask questions about labor and delivery. ? Make a trial run to the hospital. ? Visit the hospital and tour the maternity area. ? Arrange for maternity or paternity leave through employers. ? Arrange for family and friends to take care of pets while you are in the hospital. ? Purchase a rear-facing car seat and make sure you know how to install it in your car. ? Pack your hospital bag. ? Prepare the baby's nursery. Make sure to remove all pillows and stuffed animals from the baby's crib to prevent suffocation.  Visit your dentist if you have not gone during your pregnancy. Use a soft toothbrush to brush your teeth and be gentle when you floss. Contact a health  care provider if:  You are unsure if you are in labor or if your water has broken.  You become dizzy.  You have mild pelvic cramps, pelvic pressure, or nagging pain in your abdominal area.  You have lower back pain.  You have persistent nausea, vomiting, or diarrhea.  You have an unusual or bad smelling vaginal discharge.  You have pain when you urinate. Get help right away if:  Your water breaks before 37 weeks.  You have regular contractions less than 5 minutes apart before  37 weeks.  You have a fever.  You are leaking fluid from your vagina.  You have spotting or bleeding from your vagina.  You have severe abdominal pain or cramping.  You have rapid weight loss or weight gain.  You have shortness of breath with chest pain.  You notice sudden or extreme swelling of your face, hands, ankles, feet, or legs.  Your baby makes fewer than 10 movements in 2 hours.  You have severe headaches that do not go away when you take medicine.  You have vision changes. Summary  The third trimester is from week 28 through week 40, months 7 through 9. The third trimester is a time when the unborn baby (fetus) is growing rapidly.  During the third trimester, your discomfort may increase as you and your baby continue to gain weight. You may have abdominal, leg, and back pain, sleeping problems, and an increased need to urinate.  During the third trimester your breasts will keep growing and they will continue to become tender. A yellow fluid (colostrum) may leak from your breasts. This is the first milk you are producing for your baby.  False labor is a condition in which you feel small, irregular tightenings of the muscles in the womb (contractions) that eventually go away. These are called Braxton Hicks contractions. Contractions may last for hours, days, or even weeks before true labor sets in.  Signs of labor can include: abdominal cramps; regular contractions that start at 10  minutes apart and become stronger and more frequent with time; watery or bloody mucus discharge that comes from the vagina; increased pelvic pressure and dull back pain; and leaking of amniotic fluid. This information is not intended to replace advice given to you by your health care provider. Make sure you discuss any questions you have with your health care provider. Document Revised: 07/07/2018 Document Reviewed: 04/21/2016 Elsevier Patient Education  [place].

## 2019-11-03 NOTE — Progress Notes (Signed)
Prenatal Visit Note Date: 11/03/2019 Clinic: [facility]  Subjective:  Stephanie Ayala is a 29 y.o. G2P0010 at 43w5dbeing seen today for ongoing prenatal care.  She is currently monitored for the following issues for this high-risk pregnancy and has Low grade squamous intraepith lesion on cytologic smear cervix (lgsil); CIN I (cervical intraepithelial neoplasia I); Supervision of high risk pregnancy, antepartum; Fibromyalgia; Depressive disorder; [place]cerative colitis ([place]; and Excessive fetal growth affecting management of mother in third trimester, antepartum on their problem list.  Patient reports no complaints.   Contractions: Not present. Vag. Bleeding: None.  Movement: (!) Decreased. Denies leaking of fluid.   The following portions of the patient's history were reviewed and updated as appropriate: allergies, current medications, past family history, past medical history, past social history, past surgical history and problem list. Problem list updated.  Objective:   Vitals:   11/03/19 1021  BP: 120/80  Weight: 198 lb (89.8 kg)    Fetal Status:     Movement: (!) Decreased     General:  Alert, oriented and cooperative. Patient is in no acute distress.  Skin: Skin is warm and dry. No rash noted.   Cardiovascular: Normal heart rate noted  Respiratory: Normal respiratory effort, no problems with respiration noted  Abdomen: Soft, gravid, appropriate for gestational age. Pain/Pressure: Absent     Pelvic:  Cervical exam deferred        Extremities: Normal range of motion.     Mental Status: Normal mood and affect. Normal behavior. Normal judgment and thought content.   Urinalysis: NEG  Assessment and Plan:  Pregnancy: G2P0010 at 264w5d1. [redacted] weeks gestation of pregnancy PNV  2. Supervision of high risk pregnancy in third trimester  3. Ulcerative proctitis with rectal bleeding ([place] [facility] [place]  4. Excessive fetal growth affecting management of pregnancy in third trimester, single  or unspecified fetus Review of ULTRASOUND.    [facility] personally reviewed [facility]   Plan of management to be discussed with patient. Growth and LGA discussed Repeat [place] weeks Delivery planning discussed.  Prefers vag delivery  Preterm labor symptoms and general obstetric precautions including but not limited to vaginal bleeding, contractions, leaking of fluid and fetal movement were reviewed in detail with the patient. Please refer to After Visit Summary for other counseling recommendations.   pregnancy2  Problems (from 04/02/19 to present)    Problem Noted Resolved   Supervision of high risk pregnancy, antepartum 05/26/2019 by HaGae DryMD No   Overview Addendum 10/18/2019 11:41 AM by ScHomero FellersMDWest Loganrenatal Labs  Dating By [place] wks Blood type: A/Negative/-- (03/17 1511)   Genetic Screen NIPS:declines Antibody:Negative (03/17 1511)  Anatomic [place]plete Rubella: <0.90 (03/17 1511) Varicella: Non-Immune  GTT Early:104      Third trimester:  RPR: Non Reactive (03/17 1511)   Rhogam  10/18/2019 HBsAg: Negative (03/17 1511)   TDaP vaccine                       Flu Shot: HIV: Non Reactive (03/17 1511)   Baby Food                                GBS:   Contraception  Pap:06/14/2019  CBB  No   CS/VBAC N/A   Support Person DiAleene Davidsonhusband  Major depression:  Cymbalta restarted 10/18/2019- urgent Psychiatry referral made. Given list of resources.  UC: patient encouraged to restart medication and follow up with [facility].  Fibromyalgia: Patient encouraged to follow up with [facility].        Previous Version       Return in about 2 weeks (around 11/17/2019) for HROB.  Barnett Applebaum, MD, [facility], [facility] 11/03/2019  10:49 AM

## 2019-11-04 LAB — GESTATIONAL GLUCOSE TOLERANCE
Glucose, Fasting: 104 mg/dL — ABNORMAL HIGH (ref 65–94)
Glucose, GTT - 1 Hour: 177 mg/dL (ref 65–179)
Glucose, GTT - 2 Hour: 146 mg/dL (ref 65–154)
Glucose, GTT - 3 Hour: 107 mg/dL (ref 65–139)

## 2019-11-10 ENCOUNTER — Telehealth: Payer: Self-pay

## 2019-11-10 NOTE — Telephone Encounter (Signed)
Appointment note adjusted

## 2019-11-10 NOTE — Telephone Encounter (Signed)
Pt calling; 30wks; is confirmed to have covid; next steps?  [phone]

## 2019-11-10 NOTE — Telephone Encounter (Signed)
Yes, telephone appointment for 8/19. Can not be seen in office until it has been 10 days since symptom onset AND her COVID symptoms have resolved.   If she needs in person care she can be seen at the hospital.

## 2019-11-10 NOTE — Telephone Encounter (Signed)
Pt aware.

## 2019-11-13 ENCOUNTER — Encounter: Payer: Self-pay | Admitting: Emergency Medicine

## 2019-11-13 ENCOUNTER — Inpatient Hospital Stay
Admission: EM | Admit: 2019-11-13 | Discharge: 2019-11-17 | DRG: 831 | Disposition: A | Discharge status: Other Acute Inpatient Hospital | Payer: Medicaid Other | Attending: Obstetrics and Gynecology | Admitting: Obstetrics and Gynecology

## 2019-11-13 ENCOUNTER — Other Ambulatory Visit: Payer: Self-pay

## 2019-11-13 ENCOUNTER — Emergency Department: Payer: Medicaid Other

## 2019-11-13 DIAGNOSIS — J1282 Pneumonia due to coronavirus disease 2019: Secondary | ICD-10-CM | POA: Diagnosis present

## 2019-11-13 DIAGNOSIS — O98513 Other viral diseases complicating pregnancy, third trimester: Secondary | ICD-10-CM | POA: Diagnosis not present

## 2019-11-13 DIAGNOSIS — Z3A31 31 weeks gestation of pregnancy: Secondary | ICD-10-CM | POA: Diagnosis not present

## 2019-11-13 DIAGNOSIS — E669 Obesity, unspecified: Secondary | ICD-10-CM | POA: Diagnosis present

## 2019-11-13 DIAGNOSIS — D500 Iron deficiency anemia secondary to blood loss (chronic): Secondary | ICD-10-CM | POA: Diagnosis present

## 2019-11-13 DIAGNOSIS — F329 Major depressive disorder, single episode, unspecified: Secondary | ICD-10-CM | POA: Diagnosis present

## 2019-11-13 DIAGNOSIS — J9601 Acute respiratory failure with hypoxia: Secondary | ICD-10-CM | POA: Diagnosis present

## 2019-11-13 DIAGNOSIS — U071 COVID-19: Secondary | ICD-10-CM | POA: Diagnosis not present

## 2019-11-13 DIAGNOSIS — Z8701 Personal history of pneumonia (recurrent): Secondary | ICD-10-CM | POA: Insufficient documentation

## 2019-11-13 DIAGNOSIS — D509 Iron deficiency anemia, unspecified: Secondary | ICD-10-CM | POA: Diagnosis present

## 2019-11-13 DIAGNOSIS — O99343 Other mental disorders complicating pregnancy, third trimester: Secondary | ICD-10-CM | POA: Diagnosis present

## 2019-11-13 DIAGNOSIS — F32A Depression, unspecified: Secondary | ICD-10-CM | POA: Diagnosis present

## 2019-11-13 DIAGNOSIS — R0602 Shortness of breath: Secondary | ICD-10-CM

## 2019-11-13 DIAGNOSIS — Z87891 Personal history of nicotine dependence: Secondary | ICD-10-CM

## 2019-11-13 DIAGNOSIS — O99213 Obesity complicating pregnancy, third trimester: Secondary | ICD-10-CM | POA: Diagnosis present

## 2019-11-13 DIAGNOSIS — O0990 Supervision of high risk pregnancy, unspecified, unspecified trimester: Secondary | ICD-10-CM

## 2019-11-13 DIAGNOSIS — J22 Unspecified acute lower respiratory infection: Secondary | ICD-10-CM

## 2019-11-13 DIAGNOSIS — K5190 Ulcerative colitis, unspecified, without complications: Secondary | ICD-10-CM | POA: Diagnosis present

## 2019-11-13 DIAGNOSIS — O99513 Diseases of the respiratory system complicating pregnancy, third trimester: Secondary | ICD-10-CM | POA: Diagnosis present

## 2019-11-13 DIAGNOSIS — O99613 Diseases of the digestive system complicating pregnancy, third trimester: Secondary | ICD-10-CM | POA: Diagnosis present

## 2019-11-13 DIAGNOSIS — O99013 Anemia complicating pregnancy, third trimester: Secondary | ICD-10-CM | POA: Diagnosis present

## 2019-11-13 LAB — LACTATE DEHYDROGENASE: LDH: 125 U/L (ref 98–192)

## 2019-11-13 LAB — COMPREHENSIVE METABOLIC PANEL
ALT: 10 U/L (ref 0–44)
AST: 19 U/L (ref 15–41)
Albumin: 2.6 g/dL — ABNORMAL LOW (ref 3.5–5.0)
Alkaline Phosphatase: 83 U/L (ref 38–126)
Anion gap: 10 (ref 5–15)
BUN: <5 mg/dL — ABNORMAL LOW (ref 6–20)
CO2: 20 mmol/L — ABNORMAL LOW (ref 22–32)
Calcium: 8.5 mg/dL — ABNORMAL LOW (ref 8.9–10.3)
Chloride: 103 mmol/L (ref 98–111)
Creatinine, Ser: 0.38 mg/dL — ABNORMAL LOW (ref 0.44–1.00)
GFR calc Af Amer: >60 mL/min (ref >60)
GFR calc non Af Amer: >60 mL/min (ref >60)
Glucose, Bld: 135 mg/dL — ABNORMAL HIGH (ref 70–99)
Potassium: 3.5 mmol/L (ref 3.5–5.1)
Sodium: 133 mmol/L — ABNORMAL LOW (ref 135–145)
Total Bilirubin: 0.9 mg/dL (ref 0.3–1.2)
Total Protein: 6 g/dL — ABNORMAL LOW (ref 6.5–8.1)

## 2019-11-13 LAB — CBC WITH DIFFERENTIAL/PLATELET
Abs Immature Granulocytes: 0.25 10*3/uL — ABNORMAL HIGH (ref 0.00–0.07)
Basophils Absolute: 0 10*3/uL (ref 0.0–0.1)
Basophils Relative: 0 %
Eosinophils Absolute: 0 10*3/uL (ref 0.0–0.5)
Eosinophils Relative: 0 %
HCT: 33.1 % — ABNORMAL LOW (ref 36.0–46.0)
Hemoglobin: 11 g/dL — ABNORMAL LOW (ref 12.0–15.0)
Immature Granulocytes: 2 %
Lymphocytes Relative: 12 %
Lymphs Abs: 1.5 10*3/uL (ref 0.7–4.0)
MCH: 28.9 pg (ref 26.0–34.0)
MCHC: 33.2 g/dL (ref 30.0–36.0)
MCV: 87.1 fL (ref 80.0–100.0)
Monocytes Absolute: 1.1 10*3/uL — ABNORMAL HIGH (ref 0.1–1.0)
Monocytes Relative: 8 %
Neutro Abs: 9.9 10*3/uL — ABNORMAL HIGH (ref 1.7–7.7)
Neutrophils Relative %: 78 %
Platelets: 270 10*3/uL (ref 150–400)
RBC: 3.8 MIL/uL — ABNORMAL LOW (ref 3.87–5.11)
RDW: 15.7 % — ABNORMAL HIGH (ref 11.5–15.5)
WBC: 12.7 10*3/uL — ABNORMAL HIGH (ref 4.0–10.5)
nRBC: 0 % (ref 0.0–0.2)

## 2019-11-13 LAB — TROPONIN I (HIGH SENSITIVITY)
Troponin I (High Sensitivity): 3 ng/L (ref <18)
Troponin I (High Sensitivity): 2 ng/L (ref <18)

## 2019-11-13 LAB — URINALYSIS, COMPLETE (UACMP) WITH MICROSCOPIC
Bacteria, UA: NONE SEEN
Bilirubin Urine: NEGATIVE
Glucose, UA: NEGATIVE mg/dL
Hgb urine dipstick: NEGATIVE
Ketones, ur: 80 mg/dL — AB
Leukocytes,Ua: NEGATIVE
Nitrite: NEGATIVE
Protein, ur: NEGATIVE mg/dL
Specific Gravity, Urine: 1.01 (ref 1.005–1.030)
pH: 5 (ref 5.0–8.0)

## 2019-11-13 LAB — EXPECTORATED SPUTUM ASSESSMENT W GRAM STAIN, RFLX TO RESP C

## 2019-11-13 LAB — LACTIC ACID, PLASMA: Lactic Acid, Venous: 1.6 mmol/L (ref 0.5–1.9)

## 2019-11-13 LAB — FIBRIN DERIVATIVES D-DIMER (ARMC ONLY): Fibrin derivatives D-dimer (ARMC): 1605.07 ng/mL (FEU) — ABNORMAL HIGH (ref 0.00–499.00)

## 2019-11-13 LAB — HEPATITIS B SURFACE ANTIGEN: Hepatitis B Surface Ag: NONREACTIVE

## 2019-11-13 LAB — BRAIN NATRIURETIC PEPTIDE: B Natriuretic Peptide: 30.9 pg/mL (ref 0.0–100.0)

## 2019-11-13 LAB — PROCALCITONIN: Procalcitonin: 0.15 ng/mL

## 2019-11-13 LAB — FERRITIN: Ferritin: 33 ng/mL (ref 11–307)

## 2019-11-13 LAB — FIBRINOGEN: Fibrinogen: 351 mg/dL (ref 210–475)

## 2019-11-13 LAB — C-REACTIVE PROTEIN: CRP: 5.3 mg/dL — ABNORMAL HIGH (ref <1.0)

## 2019-11-13 LAB — TRIGLYCERIDES: Triglycerides: 327 mg/dL — ABNORMAL HIGH (ref <150)

## 2019-11-13 LAB — HIV ANTIBODY (ROUTINE TESTING W REFLEX): HIV Screen 4th Generation wRfx: NONREACTIVE

## 2019-11-13 MED ORDER — ACETAMINOPHEN 325 MG PO TABS
650.0000 mg | ORAL_TABLET | Freq: Four times a day (QID) | ORAL | Status: DC | PRN
  Administered 2019-11-13 – 2019-11-15 (×4): 650 mg via ORAL
  Filled 2019-11-13 (×4): qty 2

## 2019-11-13 MED ORDER — METHYLPREDNISOLONE SODIUM SUCC 40 MG IJ SOLR
40.0000 mg | Freq: Two times a day (BID) | INTRAMUSCULAR | Status: DC
  Administered 2019-11-13 – 2019-11-17 (×8): 40 mg via INTRAVENOUS
  Filled 2019-11-13 (×9): qty 1

## 2019-11-13 MED ORDER — SODIUM CHLORIDE 0.9 % IV SOLN
100.0000 mg | Freq: Every day | INTRAVENOUS | Status: AC
  Administered 2019-11-14 – 2019-11-17 (×4): 100 mg via INTRAVENOUS
  Filled 2019-11-13 (×3): qty 20
  Filled 2019-11-13: qty 100

## 2019-11-13 MED ORDER — ENOXAPARIN SODIUM 40 MG/0.4ML ~~LOC~~ SOLN
40.0000 mg | SUBCUTANEOUS | Status: DC
  Administered 2019-11-14 – 2019-11-17 (×4): 40 mg via SUBCUTANEOUS
  Filled 2019-11-13 (×4): qty 0.4

## 2019-11-13 MED ORDER — ONDANSETRON HCL 4 MG/2ML IJ SOLN: 4.0000 mg | Freq: Three times a day (TID) | INTRAMUSCULAR | Status: DC | PRN

## 2019-11-13 MED ORDER — DULOXETINE HCL 60 MG PO CPEP
60.0000 mg | ORAL_CAPSULE | Freq: Every day | ORAL | Status: DC
  Administered 2019-11-13 – 2019-11-16 (×4): 60 mg via ORAL
  Filled 2019-11-13 (×4): qty 1

## 2019-11-13 MED ORDER — IPRATROPIUM BROMIDE HFA 17 MCG/ACT IN AERS
2.0000 | INHALATION_SPRAY | RESPIRATORY_TRACT | Status: DC
  Administered 2019-11-13 – 2019-11-17 (×24): 2 via RESPIRATORY_TRACT
  Filled 2019-11-13 (×2): qty 12.9

## 2019-11-13 MED ORDER — DEXAMETHASONE SODIUM PHOSPHATE 10 MG/ML IJ SOLN
10.0000 mg | Freq: Once | INTRAMUSCULAR | Status: AC
  Administered 2019-11-13: 10 mg via INTRAVENOUS
  Filled 2019-11-13: qty 1

## 2019-11-13 MED ORDER — FERROUS SULFATE 325 (65 FE) MG PO TABS
325.0000 mg | ORAL_TABLET | Freq: Every day | ORAL | Status: DC
  Administered 2019-11-14 – 2019-11-17 (×4): 325 mg via ORAL
  Filled 2019-11-13 (×3): qty 1

## 2019-11-13 MED ORDER — VITAMIN D3 25 MCG (1000 UNIT) PO TABS
1000.0000 [IU] | ORAL_TABLET | Freq: Every day | ORAL | Status: DC
  Administered 2019-11-13 – 2019-11-16 (×4): 1000 [IU] via ORAL
  Filled 2019-11-13 (×4): qty 1

## 2019-11-13 MED ORDER — ZINC SULFATE 220 (50 ZN) MG PO CAPS
220.0000 mg | ORAL_CAPSULE | Freq: Every day | ORAL | Status: DC
  Administered 2019-11-13 – 2019-11-16 (×4): 220 mg via ORAL
  Filled 2019-11-13 (×4): qty 1

## 2019-11-13 MED ORDER — LACTATED RINGERS IV SOLN: INTRAVENOUS | Status: DC

## 2019-11-13 MED ORDER — BALSALAZIDE DISODIUM 750 MG PO CAPS
2250.0000 mg | ORAL_CAPSULE | Freq: Every day | ORAL | Status: DC
  Administered 2019-11-13 – 2019-11-17 (×5): 2250 mg via ORAL
  Filled 2019-11-13 (×7): qty 3

## 2019-11-13 MED ORDER — DM-GUAIFENESIN ER 30-600 MG PO TB12
1.0000 | ORAL_TABLET | Freq: Two times a day (BID) | ORAL | Status: DC | PRN
  Administered 2019-11-13 – 2019-11-16 (×8): 1 via ORAL
  Filled 2019-11-13 (×10): qty 1

## 2019-11-13 MED ORDER — SODIUM CHLORIDE 0.9 % IV SOLN
200.0000 mg | Freq: Once | INTRAVENOUS | Status: AC
  Administered 2019-11-13: 200 mg via INTRAVENOUS
  Filled 2019-11-13: qty 200

## 2019-11-13 MED ORDER — FOLIC ACID 1 MG PO TABS
1.0000 mg | ORAL_TABLET | Freq: Every day | ORAL | Status: DC
  Administered 2019-11-13 – 2019-11-16 (×4): 1 mg via ORAL
  Filled 2019-11-13 (×4): qty 1

## 2019-11-13 MED ORDER — SODIUM CHLORIDE 0.9 % IV SOLN: INTRAVENOUS | Status: DC

## 2019-11-13 MED ORDER — ASCORBIC ACID 500 MG PO TABS
500.0000 mg | ORAL_TABLET | Freq: Every day | ORAL | Status: DC
  Administered 2019-11-13 – 2019-11-16 (×4): 500 mg via ORAL
  Filled 2019-11-13 (×5): qty 1

## 2019-11-13 MED ORDER — ALBUTEROL SULFATE HFA 108 (90 BASE) MCG/ACT IN AERS
2.0000 | INHALATION_SPRAY | RESPIRATORY_TRACT | Status: DC | PRN
  Administered 2019-11-13 – 2019-11-17 (×21): 2 via RESPIRATORY_TRACT
  Filled 2019-11-13: qty 6.7

## 2019-11-13 MED ORDER — PRENATAL MULTIVITAMIN CH
1.0000 | ORAL_TABLET | Freq: Every day | ORAL | Status: DC
  Administered 2019-11-13 – 2019-11-17 (×5): 1 via ORAL
  Filled 2019-11-13 (×5): qty 1

## 2019-11-13 MED ORDER — LACTATED RINGERS IV SOLN: 500.0000 mL | INTRAVENOUS | Status: DC | PRN

## 2019-11-13 MED ORDER — SODIUM CHLORIDE 0.9 % IV BOLUS
1000.0000 mL | Freq: Once | INTRAVENOUS | Status: AC
  Administered 2019-11-13: 1000 mL via INTRAVENOUS

## 2019-11-13 NOTE — Consult Note (Addendum)
Maternal-Fetal Medicine Consult Note  Stephanie Ayala was seen in consultation as she is currently hospitalized due to COVID-19 pneumonia with acute shortness of breath.  She is currently being treated with oxygen by nasal cannula.    She is a 29 year old gravida 2 para 0 currently at 31 weeks.  The patient reports that she had exposure to people with COVID-19 about 2 weeks ago.  Starting about 9 to 10 days ago she experienced a sore throat, cough, and shortness of breath.  Over the past 48 hours, she reports that her symptoms have worsened with increasing shortness of breath.  She tested positive for Covid-19 about 4 days ago.  She has not received the COVID-19 vaccine.   On admission, her oxygen saturations were between 92% to 94%.  Her blood pressures were in the 100s to 120s over 60s to 80s range.  She is currently afebrile.  Her fetal heart rate tracing is reactive.  She notes positive fetal movements.  She had a chest x-ray that showed findings that were consistent with Covid viral pneumonia.    The patient reports no other complications in her current pregnancy.  She reports that she had an ultrasound performed at [facility] last week that showed appropriate fetal growth and normal amniotic fluid levels.  Her past medical history includes ulcerative colitis and fibromyalgia.  She is currently being treated with Cymbalta and balsalazide for ulcerative colitis.  I performed a telephone consultation with the patient today and the following were discussed:  COVID-19 and Pregnancy  She was advised that the therapeutic management of COVID-19 should be the same for pregnant patients as for nonpregnant patients.  Potentially effective treatments for COVID-19 should not be withheld from pregnant people because of theoretical concerns related to the safety of using those therapeutic agents in pregnancy.  As she is currently hospitalized and requires supplemental oxygen for treatment, she should be  treated with dexamethasone (or another steroid) and remdesivir.  The dose for remdesivir is 200 mg IV x1 followed by 100 mg IV once a day for 4 days or until hospital discharge.  The dose for dexamethasone is 6 mg IV or p.o. once daily for 10 days or until hospital discharge.  Additional treatment with immune modulators may be necessary should she require intubation or oxygen delivery through a high flow device.  Although there are no pregnancy specific data on the use of monoclonal antibodies, the use of anti-SARS Co-V2 monoclonal antibodies can be considered in pregnant people with COVID-19, especially those with severe disease and have failed other therapies.  Her care should be comanaged with a hospitalist and an infectious disease specialist.  Although one would expect lasting immunity, we do not have enough information on reinfection. I encouraged her to follow the same precautions of wearing mask and social distancing after recovery from current infection.  No increased adverse pregnancy outcomes are expected.  Vertical transmission to the fetus is unlikely.  She should remain on continuous monitoring for the first 24 hours. Should the fetal heart rate tracing be reactive, a daily NST should be performed for the duration of her hospitalization to assess the fetal status.   The patient may be continued on the medications (Cymbalta and balsalazide) she was taking as an outpatient.  Pregnant women hospitalized with COVID-19 should receive antithrombotic therapy. She should be started on a prophylactic dose of Lovenox (1 mg/kg once a day) while she is hospitalized.  This anticoagulation treatment does not need to be continued once  she is discharged.  The patient should have a follow-up ultrasound scheduled in our office to assess the fetal growth a few weeks after discharge from the hospital.  At the end of the consultation, the patient stated that all her questions had been answered to  her complete satisfaction.    Thank you for referring this patient for Maternal-Fetal Medicine consult.  Total time spent 40 minutes.

## 2019-11-13 NOTE — ED Notes (Signed)
Report to Jessica, RN

## 2019-11-13 NOTE — ED Triage Notes (Signed)
Patient states that she developed shortness of breat this am. Patient states that she was diagnosed with covid on Thursday. Patient states that she is [redacted] weeks pregnant.

## 2019-11-13 NOTE — ED Provider Notes (Signed)
[facility] Emergency Department Provider Note  ____________________________________________   First MD Initiated Contact with Patient 11/13/19 [phone]     (approximate)  I have reviewed the triage vital signs and the nursing notes.   HISTORY  Chief Complaint Shortness of Breath    HPI Stephanie Ayala is a 29 y.o. female  G2 P0010 (prior miscarriage) at [redacted] weeks gestation followed by [facility] (primarily Dr. Kenton Kingfisher).  She presents tonight for evaluation of shortness of breath and cough.  She had 2 Covid positive contacts within her immediate family over the last couple of weeks.  She developed some mild sore throat, congestion, cough, shortness of breath, and general malaise about 9 days ago.  She tested Covid positive about 4 days ago as an outpatient.  She received the results 3 days ago.  Over the last 24 to 48 hours, she said that her symptoms are gotten much worse including feeling like she cannot breathe at times.  She has had an increase in cough.  Exertion makes her symptoms much worse and nothing in particular makes it better.  She has had no vaginal bleeding, abdominal pain, contractions, nausea, nor vomiting.  She has had decreased appetite and intake of food and drink.  She has had intermittent subjective fever and chills and body aches although those symptoms are improving.  Her breathing symptoms have become severe, waxing and waning.  She said that her work of breathing get substantially worse with any amount of moving around.  Of note, the patient reports that she is a high risk pregnancy due to ulcerative colitis.  She also reports having arthritis but cannot remember what kind.  She is not on a biologic but she does take a daily anti-inflammatory (balsalazide) for the ulcerative colitis.        Past Medical History:  Diagnosis Date  . Autoimmune disease ([place])   . Colitis   . Depression   . Fibromyalgia     Patient Active Problem List     Diagnosis Date Noted  . Excessive fetal growth affecting management of mother in third trimester, antepartum 11/03/2019  . Supervision of high risk pregnancy, antepartum 05/26/2019  . CIN I (cervical intraepithelial neoplasia I) 04/28/2018  . Fibromyalgia 09/29/2017  . Depressive disorder 09/29/2017  . Ulcerative colitis ([place]) 09/29/2017  . Low grade squamous intraepith lesion on cytologic smear cervix (lgsil) 04/09/2017    Past Surgical History:  Procedure Laterality Date  . OVARY SURGERY Bilateral    cyst removal    Prior to Admission medications   Medication Sig Start Date End Date Taking? Authorizing Provider  balsalazide (COLAZAL) 750 MG capsule Take 2,250 mg by mouth at bedtime.    [provider]  cholecalciferol (VITAMIN D3) 25 MCG (1000 UNIT) tablet Take 1,000 Units by mouth daily. Pt unsure of dose.    [provider]  DULoxetine (CYMBALTA) 60 MG capsule Take 1 capsule (60 mg total) by mouth daily. 10/18/19 10/17/20  Homero Fellers, MD  ferrous sulfate 325 (65 FE) MG tablet Take 325 mg by mouth daily with breakfast.    [provider]  folic acid (FOLVITE) 1 MG tablet Take 1 mg by mouth daily. Pt unsure of dose.  Will check and notify next visit.    [provider]  Prenatal Vit-Fe Fumarate-FA (PRENATAL MULTIVITAMIN) TABS tablet Take 1 tablet by mouth daily at 12 noon.    [provider]    Allergies Patient has no known allergies.  Family History  Problem Relation Age of Onset  . Diabetes Father   . Hypertension Father   . Diabetes Paternal Uncle   . Heart disease Paternal Uncle   . Alcohol abuse Maternal Grandfather   . Heart disease Paternal Grandfather     Social History Social History   Tobacco Use  . Smoking status: Former Smoker    Packs/day: 0.00  . Smokeless tobacco: Never Used  Vaping Use  . Vaping Use: Every day  Substance Use Topics  . Alcohol use: Not Currently  . Drug use: No    Review of  Systems Constitutional: +fever/chills Eyes: No visual changes. ENT: No sore throat. Cardiovascular: Denies chest pain. Respiratory: Shortness of breath and cough, worse with exertion. Gastrointestinal: Decreased oral intake.  No abdominal pain.  No nausea, no vomiting.  No diarrhea.  No constipation. Genitourinary: Negative for dysuria. Musculoskeletal: Body aches but now improved.  Negative for neck pain.  Negative for back pain. Integumentary: Negative for rash. Neurological: Negative for headaches, focal weakness or numbness.   ____________________________________________   PHYSICAL EXAM:  VITAL SIGNS: ED Triage Vitals  Enc Vitals Group     BP 11/13/19 0407 117/80     Pulse Rate 11/13/19 0407 (!) 134     Resp 11/13/19 0407 (!) 36     Temp 11/13/19 0407 99.6 F (37.6 C)     Temp src --      SpO2 11/13/19 0407 94 %     Weight 11/13/19 0408 91.6 kg (202 lb)     Height 11/13/19 0408 1.575 m (5' 2" )     Head Circumference --      Peak Flow --      Pain Score 11/13/19 0419 7     Pain Loc --      Pain Edu? --      Excl. in [place]? --     Constitutional: Alert and oriented.  Eyes: Conjunctivae are normal.  Head: Atraumatic. Nose: No congestion/rhinnorhea. Mouth/Throat: Patient is wearing a mask. Neck: No stridor.  No meningeal signs.   Cardiovascular: Tachycardia, regular rhythm. Good peripheral circulation. Grossly normal heart sounds. Respiratory: Increased respiratory rate and effort but no severe retractions.  Frequent cough.  Generally normal breath sounds with no wheezing. Gastrointestinal: Soft and nontender. No distention.  Genitourinary: Deferred pelvic exam.  Bedside fetal Doppler by ED RN demonstrates fetal heart rate of about 162. Musculoskeletal: No lower extremity tenderness nor edema. No gross deformities of extremities. Neurologic:  Normal speech and language. No gross focal neurologic deficits are appreciated.  Skin:  Skin is warm, dry and  intact. Psychiatric: Mood and affect are normal. Speech and behavior are normal.  ____________________________________________   LABS (all labs ordered are listed, but only abnormal results are displayed)  Labs Reviewed  COMPREHENSIVE METABOLIC PANEL - Abnormal; Notable for the following components:      Result Value   Sodium 133 (*)    CO2 20 (*)    Glucose, Bld 135 (*)    BUN <5 (*)    Creatinine, Ser 0.38 (*)    Calcium 8.5 (*)    Total Protein 6.0 (*)    Albumin 2.6 (*)    All other components within normal limits  CBC WITH DIFFERENTIAL/PLATELET - Abnormal; Notable for the following components:   WBC 12.7 (*)    RBC 3.80 (*)    Hemoglobin 11.0 (*)    HCT 33.1 (*)    RDW 15.7 (*)    Neutro Abs 9.9 (*)  Monocytes Absolute 1.1 (*)    Abs Immature Granulocytes 0.25 (*)    All other components within normal limits  LACTIC ACID, PLASMA  URINALYSIS, COMPLETE (UACMP) WITH MICROSCOPIC  POC URINE PREG, ED   ____________________________________________  EKG  ED ECG REPORT I, Hinda Kehr, the attending physician, personally viewed and interpreted this ECG.  Date: 11/13/2019 EKG Time: 4:07 AM Rate: 135 Rhythm: Sinus tachycardia QRS Axis: normal Intervals: normal ST/T Wave abnormalities: normal Narrative Interpretation: no evidence of acute ischemia  ____________________________________________  RADIOLOGY I, Hinda Kehr, personally viewed and evaluated these images (plain radiographs) as part of my medical decision making, as well as reviewing the written report by the radiologist.  ED MD interpretation: Findings consistent with mild Covid 19 viral pneumonia  Official radiology report(s): DG Chest 2 View  Result Date: 11/13/2019 CLINICAL DATA:  Pt states that she developed shortness of breat this am. Patient states that she was diagnosed with covid on Thursday. Former smoker. Pt was shielded.shob EXAM: CHEST - 2 VIEW COMPARISON:  None. FINDINGS: Normal cardiac  silhouette. There is bilateral streaky airspace disease with a lower lobe predominance. No effusion. No pneumothorax. No acute osseous abnormality. IMPRESSION: Findings consistent with COVID viral pneumonia. Electronically Signed   By: Suzy Bouchard M.D.   On: 11/13/2019 05:55    ____________________________________________   PROCEDURES   Procedure(s) performed (including Critical Care):  Procedures   ____________________________________________   INITIAL IMPRESSION / MDM / [place] / ED COURSE  As part of my medical decision making, I reviewed the following data within the [place] notes reviewed and incorporated, Labs reviewed , EKG interpreted , Old chart reviewed, Radiograph reviewed , Discussed with admitting physician (Dr. Glennon Mac), Discussed with Infectious Disease (Dr. Delaine Lame) and Notes from prior ED visits   Differential diagnosis includes, but is not limited to, COVID-19 pneumonia, PE, community-acquired pneumonia, nonspecific pregnancy complication.  The patient is generally well-appearing at rest even though she is tachycardic and has tachypnea.  However she said that her dyspnea is much worse with exertion and when she first arrived she is considerably more tachypneic.  She received 1 L normal saline given probable volume depletion and an attempt to keep her preload higher given that she is in third shortness of pregnancy.  Her heart rate has come down to the 110s and her respiratory rate has improved to the upper 20s which is better but still not completely normal.  She has frequent cough.  I personally reviewed the chest x-ray and it is consistent with COVID-19 pneumonia, confirmed by radiology.  Lab work is generally reassuring with no significant abnormalities and a mild leukocytosis which could be secondary to pregnancy.  Lactic acid was normal.  Given the constellation of symptoms and the patient's morbidities, including  pregnancy and autoimmune disease, I talked with Dr. Delaine Lame with infectious disease after also speaking with Dr. Glennon Mac with [facility].  Dr. Delaine Lame states that remdesivir should be safe to give and steroids should be safe as long as it is okay with the OB provider (I verify that it is).  Other than that we are uncertain at this time about other treatments such as monoclonal antibodies.  We all agreed that the patient is at high risk for developing acute and emergent complications.  Under the circumstances, Dr. Glennon Mac agreed to take the patient onto his service and will consult the hospitalist team for additional assistance with managing the COVID-19 symptoms.  I explained to him the recommendation  of remdesivir per pharmacy consult.  I also passed along the information to him from Dr. Steva Ready where she strongly suggested consulting MFM.  Dr. Glennon Mac confirmed that MFM consultation is available today at [facility].  I updated the patient as well and she agrees with the plan to stay in the hospital on L&D.  I am giving the patient a dose of Decadron 10 mg IV prior to sending her to L&D.  Dr. Glennon Mac asked that we discharge the patient from the emergency department and take her to L&D as with other third trimester L&D patients rather than having him admit her directly from the emergency department.  She will be taken by ED staff to L&D directly.           ____________________________________________  FINAL CLINICAL IMPRESSION(S) / ED DIAGNOSES  Final diagnoses:  Lower respiratory tract infection due to COVID-19 virus  [redacted] weeks gestation of pregnancy     MEDICATIONS GIVEN DURING THIS VISIT:  Medications  sodium chloride 0.9 % bolus 1,000 mL (0 mLs Intravenous Stopped 11/13/19 0602)  dexamethasone (DECADRON) injection 10 mg (10 mg Intravenous Given 11/13/19 5615)     ED Discharge Orders    None      *Please note:  Stephanie Ayala was evaluated in Emergency Department on  11/13/2019 for the symptoms described in the history of present illness. She was evaluated in the context of the global COVID-19 pandemic, which necessitated consideration that the patient might be at risk for infection with the SARS-CoV-2 virus that causes COVID-19. Institutional protocols and algorithms that pertain to the evaluation of patients at risk for COVID-19 are in a state of rapid change based on information released by regulatory bodies including the CDC and federal and state organizations. These policies and algorithms were followed during the patient's care in the ED.  Some ED evaluations and interventions may be delayed as a result of limited staffing during and after the pandemic.*  Note:  This document was prepared using Dragon voice recognition software and may include unintentional dictation errors.   Hinda Kehr, MD 11/13/19 [phone]

## 2019-11-13 NOTE — ED Notes (Signed)
Report to ally, rn.

## 2019-11-13 NOTE — ED Notes (Signed)
discharged from ED to L&D

## 2019-11-13 NOTE — Discharge Instructions (Signed)
Please go directly to L&D for additional care and treatment.

## 2019-11-13 NOTE — ED Notes (Signed)
Pt is able to speak in full sentences without difficulty, no resp distress noted. Skin pwd.

## 2019-11-13 NOTE — H&P (Addendum)
OB History & Physical   History of Present Illness:  Chief Complaint: Worsening shortness of breath due to Covid 19 infection HPI:  Stephanie Ayala is a 29 y.o. G28P0010 female with EDC=01/14/2020 at 102w1ddated by a 9 week ultrasound.  Her pregnancy has been complicated by depression (restarted Cymbalta), ulcerative colitis (on Colazal), and obesity (current BMI=36.95 kg/m2). She is A negative and did receive Rhogam 10/18/2019. She had an elevated 1 hour GTT at 28 weeks and an elevated fasting blood sugar on her 3 hr GTT.  She presented to the ER this AM with worsening SOB due to Covid19 infection. Has been symptomatic x 9-10 days and tested positive for Covid on 11/10/2019. Initially had cough and sore throat then began to have fatigue and SOB this past week, worsening over the weekend.  Baby is active. No vaginal bleeding. Not feeling contractions.   Review of Systems: Review of Systems  Constitutional: Positive for malaise/fatigue. Negative for chills, fever and weight loss.  HENT: Positive for congestion and sore throat. Negative for sinus pain.   Eyes: Negative for blurred vision and pain.  Respiratory: Positive for cough and shortness of breath. Negative for hemoptysis and wheezing.   Cardiovascular: Negative for chest pain, palpitations and leg swelling.  Gastrointestinal: Positive for blood in stool, constipation and diarrhea (bloody loose stool last night-has ulcerative colitis). Negative for abdominal pain, heartburn, nausea and vomiting.  Genitourinary: Negative for dysuria, frequency, hematuria and urgency.  Musculoskeletal: Negative for back pain, joint pain and myalgias.  Skin: Negative for itching and rash.  Neurological: Positive for dizziness (lightheaded) and weakness. Negative for tingling and headaches.  Endo/Heme/Allergies: Negative for environmental allergies and polydipsia. Does not bruise/bleed easily.          Psychiatric/Behavioral: Positive for depression. The patient is  not nervous/anxious and does not have insomnia.      [facility]  [place]  Dating By [place] wks Blood type: A/Negative/-- (03/17 1511)   Genetic Screen NIPS:declines Antibody:Negative (03/17 1511)  Anatomic [place]omplete Rubella: <0.90 (03/17 1511) Varicella: Non-Immune  GTT Early:104      Third trimester: 143; 3 hour GTT: 104, 177, 146, 107 RPR: Non Reactive (03/17 1511)   Rhogam  10/18/2019 HBsAg: Negative (03/17 1511)   TDaP vaccine                       Flu Shot: HIV: Non Reactive (03/17 1511)   Baby Food                                GBS:   Contraception  Pap:06/14/2019  CBB  No   CS/VBAC N/A   Support Person DAleene Davidson husband    Major depression:  Cymbalta restarted 10/18/2019- urgent Psychiatry referral made. Given list of resources.  UC: patient encouraged to restart medication and follow up with [facility].  Fibromyalgia: Patient encouraged to [facility].       Maternal Medical History:   Past Medical History:  Diagnosis Date  . Autoimmune disease ([place]   . Colitis   . Depression   . Fibromyalgia     Past Surgical History:  Procedure Laterality Date  . OVARY SURGERY Bilateral    cyst removal    No Known Allergies  Prior to Admission medications   Medication Sig Start Date  End Date Taking? Authorizing Provider  acetaminophen (TYLENOL) 500 MG tablet Take 1,000 mg by mouth every 6 (six) hours as needed.   Yes [provider]  balsalazide (COLAZAL) 750 MG capsule Take 2,250 mg by mouth at bedtime.   Yes [provider]  cholecalciferol (VITAMIN D3) 25 MCG (1000 UNIT) tablet Take 1,000 Units by mouth daily. Pt unsure of dose.   Yes [provider]  DULoxetine (CYMBALTA) 60 MG capsule Take 1 capsule (60 mg total) by mouth daily. 10/18/19 10/17/20 Yes Schuman, Christanna R, MD  ferrous sulfate 325 (65 FE) MG tablet Take 325 mg by mouth  daily with breakfast.   Yes [provider]  folic acid (FOLVITE) 1 MG tablet Take 1 mg by mouth daily. Pt unsure of dose.  Will check and notify next visit.   Yes [provider]  guaiFENesin (MUCINEX) 600 MG 12 hr tablet Take 600 mg by mouth 2 (two) times daily as needed for cough or to loosen phlegm.   Yes [provider]  Prenatal Vit-Fe Fumarate-FA (PRENATAL MULTIVITAMIN) TABS tablet Take 1 tablet by mouth daily at 12 noon.   Yes [provider]       Social History: She  reports that she has quit smoking. She smoked 0.00 packs per day. She has never used smokeless tobacco. She reports previous alcohol use. She reports that she does not use drugs.  Family History: family history includes Alcohol abuse in her maternal grandfather; Diabetes in her father and paternal uncle; Heart disease in her paternal grandfather and paternal uncle; Hypertension in her father.       Physical Exam:  Vital Signs: BP 115/66 (BP Location: Right Arm)   Pulse (!) 111   Temp 98.1 F (36.7 C) (Oral)   Resp (!) 22   Ht 5' 2"  (1.575 m)   Wt 91.6 kg   LMP 04/02/2019   SpO2 93%   BMI 36.95 kg/m  General: breathing a little rapidly, talking at normal pace HEENT: normocephalic, atraumatic Heart: regular rhythm, but tachycardic.   No murmurs/rubs/gallops Lungs: clear to auscultation bilaterally; decreased breath sounds in bases.  Abdomen: soft, gravid, non-tender;  EFW: 4#1oz on  8/6 Extremities: non-tender, symmetric, no edema bilaterally.   Neurologic: Alert & oriented x 3.     Baseline FHR: baseline 130 with accelerations to 150s, moderate variability Toco: mild irregular contractions initially, currently mostly irritability  Results for orders placed or performed during the hospital encounter of 11/13/19 (from the past 24 hour(s))  Lactic acid, plasma     Status: None   Collection Time: 11/13/19  4:34 AM  Result Value Ref Range   Lactic Acid, Venous 1.6 0.5 -  1.9 mmol/L  Comprehensive metabolic panel     Status: Abnormal   Collection Time: 11/13/19  4:34 AM  Result Value Ref Range   Sodium 133 (L) 135 - 145 mmol/L   Potassium 3.5 3.5 - 5.1 mmol/L   Chloride 103 98 - 111 mmol/L   CO2 20 (L) 22 - 32 mmol/L   Glucose, Bld 135 (H) 70 - 99 mg/dL   BUN <5 (L) 6 - 20 mg/dL   Creatinine, Ser 0.38 (L) 0.44 - 1.00 mg/dL   Calcium 8.5 (L) 8.9 - 10.3 mg/dL   Total Protein 6.0 (L) 6.5 - 8.1 g/dL   Albumin 2.6 (L) 3.5 - 5.0 g/dL   AST 19 15 - 41 U/L   ALT 10 0 - 44 U/L   Alkaline Phosphatase 83  38 - 126 U/L   Total Bilirubin 0.9 0.3 - 1.2 mg/dL   GFR calc non Af Amer >60 >60 mL/min   GFR calc Af Amer >60 >60 mL/min   Anion gap 10 5 - 15  CBC with Differential     Status: Abnormal   Collection Time: 11/13/19  4:34 AM  Result Value Ref Range   WBC 12.7 (H) 4.0 - 10.5 K/uL   RBC 3.80 (L) 3.87 - 5.11 MIL/uL   Hemoglobin 11.0 (L) 12.0 - 15.0 g/dL   HCT 33.1 (L) 36 - 46 %   MCV 87.1 80.0 - 100.0 fL   MCH 28.9 26.0 - 34.0 pg   MCHC 33.2 30.0 - 36.0 g/dL   RDW 15.7 (H) 11.5 - 15.5 %   Platelets 270 150 - 400 K/uL   nRBC 0.0 0.0 - 0.2 %   Neutrophils Relative % 78 %   Neutro Abs 9.9 (H) 1.7 - 7.7 K/uL   Lymphocytes Relative 12 %   Lymphs Abs 1.5 0.7 - 4.0 K/uL   Monocytes Relative 8 %   Monocytes Absolute 1.1 (H) 0 - 1 K/uL   Eosinophils Relative 0 %   Eosinophils Absolute 0.0 0 - 0 K/uL   Basophils Relative 0 %   Basophils Absolute 0.0 0 - 0 K/uL   Immature Granulocytes 2 %   Abs Immature Granulocytes 0.25 (H) 0.00 - 0.07 K/uL   DG Chest 2 View  Result Date: 11/13/2019 CLINICAL DATA:  Pt states that she developed shortness of breat this am. Patient states that she was diagnosed with covid on Thursday. Former smoker. Pt was shielded.shob EXAM: CHEST - 2 VIEW COMPARISON:  None. FINDINGS: Normal cardiac silhouette. There is bilateral streaky airspace disease with a lower lobe predominance. No effusion. No pneumothorax. No acute osseous  abnormality. IMPRESSION: Findings consistent with COVID viral pneumonia. Electronically Signed   By: Suzy Bouchard M.D.   On: 11/13/2019 05:55   Assessment:  Stephanie Ayala is a 29 y.o. G2P0010 female at 75w1dwith SOB due to Covid 19  Pulse O2 92-93% here in L&D.-started on O2 by nasal canula at 2 L/min to try to keep  pulse O2 at or above 94%  Received first dose of dexamethasone in ER  Probably needs Remdesivir  Plan:  1. Admit to Labor & Delivery for observation  2. Regular diet 3. Continuous fetal monitoring 4. Continuous pulse O2-increase O2 as needed to keep pulse O2 at or greater than 94% 5. Consulted Dr FAnnamaria Boots MFM 6. Consulted Dr NBlaine Hamper hospitalist 7. TED hose CDalia Heading 11/13/2019 9:56 AM

## 2019-11-13 NOTE — Consult Note (Signed)
Remdesivir - Pharmacy Brief Note   O:  ALT: 10 CXR: "findings consistent with COVID viral pneumonia" SpO2: 93% on 2L [place]   A/P:  11/09/19 SARS-CoV-2 PCR (+)  Remdesivir 200 mg IVPB once followed by 100 mg IVPB daily x 4 days.   Benita Gutter 11/13/2019 10:13 AM

## 2019-11-13 NOTE — OB Triage Note (Signed)
Pt. presents to L/D triage with reports of COVID-related SOB, dizziness, headache, and a productive cough. She reports feeling lightheaded with exertion.She also reports muscle aches, ongoing diarrhea, and increased thirst. No pain, bleeding, or LOF. She reports positive fetal movement. Pulse 120 and O2 Sat 93. She is afebrile. Monitors applied and assessing. Pt. Was dx with COVID 11/10/19.

## 2019-11-13 NOTE — Consult Note (Addendum)
Medical Consultation   TEALE GOODGAME  WUG:[id number]  DOB: 03-03-1991  DOA: 11/13/2019  PCP: [facility]    Outpatient Specialists:    Requesting physician: [facility]  Reason for consultation: -covid 19 infection   History of Present Illness: ETERNITY DEXTER is an 29 y.o. female with a past medical history of depression, fibromyalgia, ulcerative colitis, iron deficiency anemia, pregnancy of  [redacted]w[redacted]d who presents with cough, shortness breath.  Pt is G2P0010 female with EDC=01/14/2020 at 338w1dated by a 9 week ultrasound per Ob/Gyn's note. Pt states that she has been having cough, sore throat and shortness breath for 9 days, which has progressively worsening. She had positive Covid test on 11/09/2019. Currently patient denies chest pain, fever or chills.  Patient states that she has mild nausea and diarrhea, no vomiting or abdominal pain.  No symptoms of UTI.  No vaginal discharge or vaginal bleeding.    Lab and tests: WBC 12.7, lactic acid 1.6, electrolytes renal function okay, temperature 98.1, blood pressure 115/66, tachycardia, tachypnea, oxygen saturation 92 to 95% on room air.  Chest x-ray showed bilateral streaks of infiltration.    Review of Systems:   General: no fevers, chills, no changes in body weight, no changes in appetite Skin: no rash HEENT: no blurry vision, hearing changes, has sore throat Pulm: has dyspnea, coughing, no wheezing CV: no chest pain, palpitations, shortness of breath Abd: has nausea and diarrhea, no bomiting, abdominal pain, constipation GU: no dysuria, hematuria, polyuria Ext: no arthralgias, myalgias Neuro: no weakness, numbness, or tingling Ob/Gyn: pregnancy of  3184w1d  Past Medical History: Past Medical History:  Diagnosis Date  . Autoimmune disease ([facility] . Colitis   . Depression   . Fibromyalgia     Past Surgical History: Past Surgical History:  Procedure Laterality Date  . OVARY  SURGERY Bilateral    cyst removal     Allergies:  No Known Allergies   Social History:  reports that she has quit smoking. She smoked 0.00 packs per day. She has never used smokeless tobacco. She reports previous alcohol use. She reports that she does not use drugs.   Family History: Family History  Problem Relation Age of Onset  . Diabetes Father   . Hypertension Father   . Diabetes Paternal Uncle   . Heart disease Paternal Uncle   . Alcohol abuse Maternal Grandfather   . Heart disease Paternal Grandfather     Physical Exam: Vitals:   11/13/19 0715 11/13/19 0800 11/13/19 0847 11/13/19 0858  BP: 115/66 115/70    Pulse: (!) 111 (!) 117    Resp: (!) 22  (!) 22   Temp: 98.3 F (36.8 C)  98.1 F (36.7 C)   TempSrc: Oral  Oral   SpO2: 95% 92% 93%   Weight:      Height:    5' 2"  (1.575 m)    General: Not in acute distress HEENT: PERRL, EOMI, no scleral icterus, No JVD or bruit Cardiac: S1/S2, RRR, No murmurs, gallops or rubs Pulm: No rales, wheezing, rhonchi or rubs. Abd: Soft, nondistended, nontender, no rebound pain, no organomegaly, BS present.  Abdominal size is consistent with 31-week pregnancy. Ext: No edema. 1+DP/PT pulse bilaterally Musculoskeletal: No joint deformities, erythema, or stiffness, ROM full Skin: No rashes.  Neuro: Alert and oriented X3, cranial nerves II-XII grossly intact, muscle strength 5/5 in all  extremeties. Psych: Patient is not psychotic, no suicidal or hemocidal ideation.     Data reviewed:  I have personally reviewed following labs and imaging studies Labs:  CBC: Recent Labs  Lab 11/13/19 0434  WBC 12.7*  NEUTROABS 9.9*  HGB 11.0*  HCT 33.1*  MCV 87.1  PLT 562    Basic Metabolic Panel: Recent Labs  Lab 11/13/19 0434  NA 133*  K 3.5  CL 103  CO2 20*  GLUCOSE 135*  BUN <5*  CREATININE 0.38*  CALCIUM 8.5*   GFR Estimated Creatinine Clearance: 109.3 mL/min (A) (by C-G formula based on SCr of 0.38 mg/dL (L)). Liver  Function Tests: Recent Labs  Lab 11/13/19 0434  AST 19  ALT 10  ALKPHOS 83  BILITOT 0.9  PROT 6.0*  ALBUMIN 2.6*   No results for input(s): LIPASE, AMYLASE in the last 168 hours. No results for input(s): AMMONIA in the last 168 hours. Coagulation profile No results for input(s): INR, PROTIME in the last 168 hours.  Cardiac Enzymes: No results for input(s): CKTOTAL, CKMB, CKMBINDEX, TROPONINI in the last 168 hours. BNP: Invalid input(s): POCBNP CBG: No results for input(s): GLUCAP in the last 168 hours. D-Dimer No results for input(s): DDIMER in the last 72 hours. Hgb A1c No results for input(s): HGBA1C in the last 72 hours. Lipid Profile No results for input(s): CHOL, HDL, LDLCALC, TRIG, CHOLHDL, LDLDIRECT in the last 72 hours. Thyroid function studies No results for input(s): TSH, T4TOTAL, T3FREE, THYROIDAB in the last 72 hours.  Invalid input(s): FREET3 Anemia work up No results for input(s): VITAMINB12, FOLATE, FERRITIN, TIBC, IRON, RETICCTPCT in the last 72 hours. Urinalysis    Component Value Date/Time   COLORURINE YELLOW 03/26/2015 1540   APPEARANCEUR CLEAR 03/26/2015 1540   LABSPEC 1.020 03/26/2015 1540   PHURINE 5.5 03/26/2015 1540   GLUCOSEU Negative 08/25/2019 1129   HGBUR NEGATIVE 03/26/2015 1540   BILIRUBINUR 1+ (A) 03/26/2015 1540   KETONESUR TRACE (A) 03/26/2015 1540   PROTEINUR NEGATIVE 03/26/2015 1540   NITRITE NEGATIVE 03/26/2015 1540   LEUKOCYTESUR NEGATIVE 03/26/2015 1540     Microbiology No results found for this or any previous visit (from the past 240 hour(s)).     Inpatient Medications:   Scheduled Meds: . vitamin C  500 mg Oral Daily  . ipratropium  2 puff Inhalation Q4H  . methylPREDNISolone (SOLU-MEDROL) injection  40 mg Intravenous Q12H  . zinc sulfate  220 mg Oral Daily   Continuous Infusions: . lactated ringers 75 mL/hr at 11/13/19 1000  . remdesivir 200 mg in sodium chloride 0.9% 250 mL IVPB     Followed by  . [START ON  11/14/2019] remdesivir 100 mg in NS 100 mL       Radiological Exams on Admission: DG Chest 2 View  Result Date: 11/13/2019 CLINICAL DATA:  Pt states that she developed shortness of breat this am. Patient states that she was diagnosed with covid on Thursday. Former smoker. Pt was shielded.shob EXAM: CHEST - 2 VIEW COMPARISON:  None. FINDINGS: Normal cardiac silhouette. There is bilateral streaky airspace disease with a lower lobe predominance. No effusion. No pneumothorax. No acute osseous abnormality. IMPRESSION: Findings consistent with COVID viral pneumonia. Electronically Signed   By: Suzy Bouchard M.D.   On: 11/13/2019 05:55    Impression/Recommendations Principal Problem:   Pneumonia due to COVID-19 virus Active Problems:   Depressive disorder   Ulcerative colitis ([place])   [redacted] weeks gestation of pregnancy   Iron deficiency anemia  Pneumonia due to COVID-19 virus: Oxygen saturation 92 to 95% on room air, currently 99% on 2L O2.  Has positive chest x-ray with bilateral streaks of infiltration.  Patient has mild leukocytosis with WBC 12.7, but no fever.  Will hold off antibiotics and get blood culture.  -Remdesivir per pharm -Solumedrol 40 mg bid -vitamin C, zinc.  -Bronchodilators -PRN Mucinex for cough -f/u Blood culture -Gentle IV fluid: Patient received 1 L LR already. Will discontinue further IV fluid bolus, will continue LR at 75 cc/h -D-dimer, BNP,Trop, LFT, CRP, LDH, Procalcitonin, Ferritin, fibinogen, TG, Hep B SAg, HIV ab -Daily CRP, Ferritin, D-dimer, -if it pt developed oxygen desaturation, if is okay with OB/GYN, will ask the patient to maintain an awake prone position for 16+ hours a day, if possible, with a minimum of 2-3 hours at a time -Will attempt to maintain euvolemia to a net negative fluid status -IF patient deteriorates, will consult PCCM and ID  Depressive disorder: -Continue Cymbalta  Ulcerative colitis (HCC) -Continue balsalazide  Iron  deficiency anemia: Hemoglobin 11.0 -Continue iron supplement  [redacted] weeks gestation of pregnancy -Management per primary team OB/GYN      Thank you for this consultation.  Our [facility] hospitalist team will follow the patient with you.   Time Spent:  41 min  Ivor Costa M.D. [facility] 11/13/2019, 10:49 AM

## 2019-11-14 ENCOUNTER — Encounter: Payer: Self-pay | Admitting: Anesthesiology

## 2019-11-14 ENCOUNTER — Encounter
Admission: EM | Disposition: A | Discharge status: Other Acute Inpatient Hospital | Payer: Self-pay | Source: Home / Self Care | Attending: Certified Nurse Midwife

## 2019-11-14 DIAGNOSIS — K5100 Ulcerative (chronic) pancolitis without complications: Secondary | ICD-10-CM

## 2019-11-14 DIAGNOSIS — D508 Other iron deficiency anemias: Secondary | ICD-10-CM | POA: Diagnosis not present

## 2019-11-14 DIAGNOSIS — O99013 Anemia complicating pregnancy, third trimester: Secondary | ICD-10-CM | POA: Diagnosis present

## 2019-11-14 DIAGNOSIS — O99213 Obesity complicating pregnancy, third trimester: Secondary | ICD-10-CM | POA: Diagnosis present

## 2019-11-14 DIAGNOSIS — J1282 Pneumonia due to coronavirus disease 2019: Secondary | ICD-10-CM | POA: Diagnosis present

## 2019-11-14 DIAGNOSIS — O98513 Other viral diseases complicating pregnancy, third trimester: Secondary | ICD-10-CM | POA: Diagnosis present

## 2019-11-14 DIAGNOSIS — J9601 Acute respiratory failure with hypoxia: Secondary | ICD-10-CM | POA: Diagnosis present

## 2019-11-14 DIAGNOSIS — R0602 Shortness of breath: Secondary | ICD-10-CM | POA: Diagnosis present

## 2019-11-14 DIAGNOSIS — D509 Iron deficiency anemia, unspecified: Secondary | ICD-10-CM | POA: Diagnosis present

## 2019-11-14 DIAGNOSIS — K5190 Ulcerative colitis, unspecified, without complications: Secondary | ICD-10-CM | POA: Diagnosis present

## 2019-11-14 DIAGNOSIS — J22 Unspecified acute lower respiratory infection: Secondary | ICD-10-CM | POA: Diagnosis not present

## 2019-11-14 DIAGNOSIS — Z3A31 31 weeks gestation of pregnancy: Secondary | ICD-10-CM | POA: Diagnosis not present

## 2019-11-14 DIAGNOSIS — O99613 Diseases of the digestive system complicating pregnancy, third trimester: Secondary | ICD-10-CM | POA: Diagnosis present

## 2019-11-14 DIAGNOSIS — E669 Obesity, unspecified: Secondary | ICD-10-CM | POA: Diagnosis present

## 2019-11-14 DIAGNOSIS — F329 Major depressive disorder, single episode, unspecified: Secondary | ICD-10-CM | POA: Diagnosis present

## 2019-11-14 DIAGNOSIS — O99513 Diseases of the respiratory system complicating pregnancy, third trimester: Secondary | ICD-10-CM

## 2019-11-14 DIAGNOSIS — U071 COVID-19: Secondary | ICD-10-CM | POA: Diagnosis present

## 2019-11-14 DIAGNOSIS — Z87891 Personal history of nicotine dependence: Secondary | ICD-10-CM | POA: Diagnosis not present

## 2019-11-14 DIAGNOSIS — O99343 Other mental disorders complicating pregnancy, third trimester: Secondary | ICD-10-CM | POA: Diagnosis present

## 2019-11-14 LAB — CBC WITH DIFFERENTIAL/PLATELET
Abs Immature Granulocytes: 0.42 10*3/uL — ABNORMAL HIGH (ref 0.00–0.07)
Basophils Absolute: 0 10*3/uL (ref 0.0–0.1)
Basophils Relative: 0 %
Eosinophils Absolute: 0 10*3/uL (ref 0.0–0.5)
Eosinophils Relative: 0 %
HCT: 27.5 % — ABNORMAL LOW (ref 36.0–46.0)
Hemoglobin: 9.3 g/dL — ABNORMAL LOW (ref 12.0–15.0)
Immature Granulocytes: 4 %
Lymphocytes Relative: 17 %
Lymphs Abs: 1.7 10*3/uL (ref 0.7–4.0)
MCH: 29.2 pg (ref 26.0–34.0)
MCHC: 33.8 g/dL (ref 30.0–36.0)
MCV: 86.2 fL (ref 80.0–100.0)
Monocytes Absolute: 0.8 10*3/uL (ref 0.1–1.0)
Monocytes Relative: 8 %
Neutro Abs: 7.1 10*3/uL (ref 1.7–7.7)
Neutrophils Relative %: 71 %
Platelets: 263 10*3/uL (ref 150–400)
RBC: 3.19 MIL/uL — ABNORMAL LOW (ref 3.87–5.11)
RDW: 15.9 % — ABNORMAL HIGH (ref 11.5–15.5)
WBC: 10 10*3/uL (ref 4.0–10.5)
nRBC: 0 % (ref 0.0–0.2)

## 2019-11-14 LAB — C-REACTIVE PROTEIN: CRP: 3.1 mg/dL — ABNORMAL HIGH (ref <1.0)

## 2019-11-14 LAB — COMPREHENSIVE METABOLIC PANEL
ALT: 8 U/L (ref 0–44)
AST: 16 U/L (ref 15–41)
Albumin: 2.1 g/dL — ABNORMAL LOW (ref 3.5–5.0)
Alkaline Phosphatase: 82 U/L (ref 38–126)
Anion gap: 9 (ref 5–15)
BUN: <5 mg/dL — ABNORMAL LOW (ref 6–20)
CO2: 18 mmol/L — ABNORMAL LOW (ref 22–32)
Calcium: 8.1 mg/dL — ABNORMAL LOW (ref 8.9–10.3)
Chloride: 107 mmol/L (ref 98–111)
Creatinine, Ser: <0.3 mg/dL — ABNORMAL LOW (ref 0.44–1.00)
Glucose, Bld: 124 mg/dL — ABNORMAL HIGH (ref 70–99)
Potassium: 3.8 mmol/L (ref 3.5–5.1)
Sodium: 134 mmol/L — ABNORMAL LOW (ref 135–145)
Total Bilirubin: 0.8 mg/dL (ref 0.3–1.2)
Total Protein: 5 g/dL — ABNORMAL LOW (ref 6.5–8.1)

## 2019-11-14 LAB — FERRITIN: Ferritin: 26 ng/mL (ref 11–307)

## 2019-11-14 LAB — FIBRIN DERIVATIVES D-DIMER (ARMC ONLY): Fibrin derivatives D-dimer (ARMC): 1125.23 ng/mL (FEU) — ABNORMAL HIGH (ref 0.00–499.00)

## 2019-11-14 SURGERY — Surgical Case
Anesthesia: Choice

## 2019-11-14 MED ORDER — SODIUM CHLORIDE 0.9 % IV SOLN: 500.0000 mg | INTRAVENOUS | Status: DC

## 2019-11-14 SURGICAL SUPPLY — 30 items
CANISTER SUCT 3000ML PPV (MISCELLANEOUS) ×2 IMPLANT
CATH KIT ON-Q SILVERSOAK 5IN (CATHETERS) ×4 IMPLANT
COVER WAND RF STERILE (DRAPES) ×2 IMPLANT
DERMABOND ADVANCED (GAUZE/BANDAGES/DRESSINGS) ×1
DERMABOND ADVANCED .7 DNX12 (GAUZE/BANDAGES/DRESSINGS) ×1 IMPLANT
DRSG OPSITE POSTOP 4X10 (GAUZE/BANDAGES/DRESSINGS) ×2 IMPLANT
DRSG TELFA 3X8 NADH (GAUZE/BANDAGES/DRESSINGS) ×2 IMPLANT
ELECT CAUTERY BLADE 6.4 (BLADE) ×2 IMPLANT
ELECT REM PT RETURN 9FT ADLT (ELECTROSURGICAL) ×2
ELECTRODE REM PT RTRN 9FT ADLT (ELECTROSURGICAL) ×1 IMPLANT
GAUZE SPONGE 4X4 12PLY STRL (GAUZE/BANDAGES/DRESSINGS) ×2 IMPLANT
GLOVE BIO SURGEON STRL SZ7 (GLOVE) ×2 IMPLANT
GLOVE INDICATOR 7.5 STRL GRN (GLOVE) ×2 IMPLANT
GOWN STRL REUS W/ TWL LRG LVL3 (GOWN DISPOSABLE) ×3 IMPLANT
GOWN STRL REUS W/TWL LRG LVL3 (GOWN DISPOSABLE) ×3
NS IRRIG 1000ML POUR BTL (IV SOLUTION) ×2 IMPLANT
PACK C SECTION (MISCELLANEOUS) ×2 IMPLANT
PAD OB MATERNITY 4.3X12.25 (PERSONAL CARE ITEMS) ×4 IMPLANT
PAD PREP 24X41 OB/GYN DISP (PERSONAL CARE ITEMS) ×2 IMPLANT
SPONGE LAP 18X18 RF (DISPOSABLE) ×4 IMPLANT
STRIP CLOSURE SKIN 1/2X4 (GAUZE/BANDAGES/DRESSINGS) ×2 IMPLANT
SUT CHROMIC GUT BROWN 0 54 (SUTURE) ×1 IMPLANT
SUT CHROMIC GUT BROWN 0 54IN (SUTURE) ×2
SUT MNCRL 4-0 (SUTURE) ×1
SUT MNCRL 4-0 27XMFL (SUTURE) ×1
SUT PDS AB 1 TP1 96 (SUTURE) ×2 IMPLANT
SUT PLAIN 2 0 XLH (SUTURE) ×2 IMPLANT
SUT VIC AB 0 CT1 36 (SUTURE) ×8 IMPLANT
SUTURE MNCRL 4-0 27XMF (SUTURE) ×1 IMPLANT
SWABSTK COMLB BENZOIN TINCTURE (MISCELLANEOUS) ×2 IMPLANT

## 2019-11-14 NOTE — Progress Notes (Signed)
PROGRESS NOTE    Stephanie Ayala  WRU:[id number] DOB: 1991-03-13 DOA: 11/13/2019 PCP: [facility]   Brief Narrative:  Stephanie Ayala is an 29 y.o. female with a past medical history of depression, fibromyalgia, ulcerative colitis, iron deficiency anemia, pregnancy of  [redacted]w[redacted]d who presents with cough, shortness breath. Pt is G2P0010 female with EDC=10/17/2021at 329w1dated bya 9 week ultrasound per Ob/Gyn's note.  Tested positive on 11/09/2019.  Found to be hypoxic requiring up to 6 L in ED.  Chest x-ray with bilateral infiltrate.  [facility] was consulted as she is being admitted with OB service.  Subjective: Patient continued to have quite a bit of cough and shortness of breath.  She was complaining of dry nose with 6 L of oxygen, currently comfortable on 4 L and saturating in mid 90s.  Assessment & Plan:   Principal Problem:   Pneumonia due to COVID-19 virus Active Problems:   Depressive disorder   Ulcerative colitis ([facility]  [redacted] weeks gestation of pregnancy   Iron deficiency anemia   Lower respiratory tract infection due to COVID-19 virus  Acute hypoxic respiratory failure secondary to COVID-19 pneumonia. 3152-weekregnant lady with COVID-19 pneumonia.  According to protocol pregnant people can be treated with COVID-19 pneumonia same as nonpregnant.  Elevated inflammatory markers, not trending down, PCT at 0.15, which can be just due to COVID-19. -Continue with remdesivir and Decadron-day 2 -Continue to monitor inflammatory markers. -Continue supplemental oxygen at 4 L-try to wean, need a OB/GYN input regarding a safe saturation level in terms of her pregnancy. -Continue with supportive care. -Discontinue IV fluid to maintain euvolemia. -Encourage p.o. hydration.  Depressive disorder: -Continue Cymbalta  Ulcerative colitis (HCC) -Continue balsalazide  Iron deficiency anemia: Hemoglobin 11.0 -Continue iron supplement  [redacted] weeks gestation of pregnancy -Management  per primary team OB/GYN  Objective: Vitals:   11/14/19 1148 11/14/19 1149 11/14/19 1150 11/14/19 1151  BP:      Pulse: (!) 115 (!) 115 (!) 117 (!) 114  Resp: (!) 27 20 (!) 29 (!) 22  Temp:      TempSrc:      SpO2: 94% 94% 94% 95%  Weight:      Height:        Intake/Output Summary (Last 24 hours) at 11/14/2019 1415 Last data filed at 11/14/2019 [id number] per 24 hour  Intake 771.62 ml  Output 2250 ml  Net -1478.38 ml   Filed Weights   11/13/19 0408 11/13/19 0419  Weight: 91.6 kg 91.6 kg    Examination:  General exam: 31-week pregnant lady, appears calm and comfortable  Respiratory system: Clear to auscultation. Respiratory effort normal. Cardiovascular system: S1 & S2 heard, RRR. No JVD, murmurs, Gastrointestinal system: Soft, nontender, nondistended, bowel sounds positive. Central nervous system: Alert and oriented. No focal neurological deficits.Symmetric 5 x 5 power. Extremities: No edema, no cyanosis, pulses intact and symmetrical. Skin: No rashes, lesions or ulcers Psychiatry: Judgement and insight appear normal. Mood & affect appropriate.    DVT prophylaxis: Lovenox Code Status: Full Family Communication: Discussed with patient. Patient is admitted under OB/GYN service.  We are coOptometrist Procedures:  Antimicrobials:   Data Reviewed: I have personally reviewed following labs and imaging studies  CBC: Recent Labs  Lab 11/13/19 0434 11/14/19 0556  WBC 12.7* 10.0  NEUTROABS 9.9* 7.1  HGB 11.0* 9.3*  HCT 33.1* 27.5*  MCV 87.1 86.2  PLT 270 26829 Basic Metabolic Panel: Recent Labs  Lab 11/13/19 0434 11/14/19 0556  NA 133* 134*  K 3.5 3.8  CL 103 107  CO2 20* 18*  GLUCOSE 135* 124*  BUN <5* <5*  CREATININE 0.38* <0.30*  CALCIUM 8.5* 8.1*   GFR: CrCl cannot be calculated (This lab value cannot be used to calculate CrCl because it is not a number: <0.30). Liver Function Tests: Recent Labs  Lab 11/13/19 0434 11/14/19 0556  AST 19 16  ALT  10 8  ALKPHOS 83 82  BILITOT 0.9 0.8  PROT 6.0* 5.0*  ALBUMIN 2.6* 2.1*   No results for input(s): LIPASE, AMYLASE in the last 168 hours. No results for input(s): AMMONIA in the last 168 hours. Coagulation Profile: No results for input(s): INR, PROTIME in the last 168 hours. Cardiac Enzymes: No results for input(s): CKTOTAL, CKMB, CKMBINDEX, TROPONINI in the last 168 hours. BNP (last 3 results) No results for input(s): PROBNP in the last 8760 hours. HbA1C: No results for input(s): HGBA1C in the last 72 hours. CBG: No results for input(s): GLUCAP in the last 168 hours. Lipid Profile: Recent Labs    11/13/19 1111  TRIG 327*   Thyroid Function Tests: No results for input(s): TSH, T4TOTAL, FREET4, T3FREE, THYROIDAB in the last 72 hours. Anemia Panel: Recent Labs    11/13/19 1111 11/14/19 0556  FERRITIN 33 26   Sepsis Labs: Recent Labs  Lab 11/13/19 0434 11/13/19 1111  PROCALCITON  --  0.15  LATICACIDVEN 1.6  --     Recent Results (from the past 240 hour(s))  Culture, sputum-assessment     Status: None   Collection Time: 11/13/19 11:09 AM   Specimen: Expectorated Sputum  Result Value Ref Range Status   Specimen Description EXPECTORATED SPUTUM  Final   Special Requests NONE  Final   Sputum evaluation   Final    THIS SPECIMEN IS ACCEPTABLE FOR SPUTUM CULTURE Performed at [facility], [address], [place], [place] [address]    Report Status 11/13/2019 FINAL  Final  Culture, respiratory     Status: None (Preliminary result)   Collection Time: 11/13/19 11:09 AM  Result Value Ref Range Status   Specimen Description   Final    EXPECTORATED SPUTUM Performed at [facility], [address], [place], [place] [address]    Special Requests   Final    NONE Reflexed from [id number] Performed at [facility], [address] [place], [place] [id number]    Gram Stain   Final    ABUNDANT WBC PRESENT, PREDOMINANTLY PMN RARE GRAM POSITIVE  COCCI    Culture   Final    TOO YOUNG TO READ Performed at [facility], [place] [address] [place],  [address]    Report Status PENDING  Incomplete  Culture, blood (Routine X 2) w Reflex to ID Panel     Status: None (Preliminary result)   Collection Time: 11/13/19 11:11 AM   Specimen: BLOOD  Result Value Ref Range Status   Specimen Description BLOOD RIGHT AC  Final   Special Requests   Final    BOTTLES DRAWN AEROBIC AND ANAEROBIC Blood Culture results may not be optimal due to an excessive volume of blood received in culture bottles   Culture   Final    NO GROWTH < 24 HOURS Performed at [facility], [address] [place],  [address]    Report Status PENDING  Incomplete  Culture, blood (Routine X 2) w Reflex to ID Panel     Status: None (Preliminary result)  Collection Time: 11/13/19 11:11 AM   Specimen: BLOOD  Result Value Ref Range Status   Specimen Description BLOOD LEFT AC  Final   Special Requests   Final    BOTTLES DRAWN AEROBIC AND ANAEROBIC Blood Culture results may not be optimal due to an excessive volume of blood received in culture bottles   Culture   Final    NO GROWTH < 24 HOURS Performed at [facility], [address] [place], [place] [address]    Report Status PENDING  Incomplete     Radiology Studies: DG Chest 2 View  Result Date: 11/13/2019 CLINICAL DATA:  Pt states that she developed shortness of breat this am. Patient states that she was diagnosed with covid on Thursday. Former smoker. Pt was shielded.shob EXAM: CHEST - 2 VIEW COMPARISON:  None. FINDINGS: Normal cardiac silhouette. There is bilateral streaky airspace disease with a lower lobe predominance. No effusion. No pneumothorax. No acute osseous abnormality. IMPRESSION: Findings consistent with COVID viral pneumonia. Electronically Signed   By: Suzy Bouchard M.D.   On: 11/13/2019 05:55    Scheduled Meds: . vitamin C  500 mg Oral Daily  . balsalazide   2,250 mg Oral Daily  . cholecalciferol  1,000 Units Oral Daily  . DULoxetine  60 mg Oral Daily  . enoxaparin (LOVENOX) injection  40 mg Subcutaneous Q24H  . ferrous sulfate  325 mg Oral Q breakfast  . folic acid  1 mg Oral Daily  . ipratropium  2 puff Inhalation Q4H  . methylPREDNISolone (SOLU-MEDROL) injection  40 mg Intravenous Q12H  . prenatal multivitamin  1 tablet Oral Q1200  . zinc sulfate  220 mg Oral Daily   Continuous Infusions: . remdesivir 100 mg in NS 100 mL 100 mg (11/14/19 1003)     LOS: 0 days   Time spent: 40 minutes.  Lorella Nimrod, MD [facility]  If 7PM-7AM, please contact night-coverage [email or web address]  11/14/2019, 2:15 PM   This record has been created using Systems analyst. Errors have been sought and corrected,but may not always be located. Such creation errors do not reflect on the standard of care.

## 2019-11-14 NOTE — Progress Notes (Addendum)
Daily Antepartum Note  Admission Date: 11/13/2019 Current Date: 11/14/2019 2:12 PM  Chief Complaint: Stephanie Ayala is a 29 y.o. G2P0010 @ 71w2ddated by a 9 week ultrasound, HD#2, admitted for symptomatic COVID19 infection, requiring .  Pregnancy complicated by:  Patient Active Problem List   Diagnosis Date Noted  . Lower respiratory tract infection due to COVID-19 virus 11/14/2019  . COVID-19 virus infection 11/13/2019  . Iron deficiency anemia 11/13/2019  . [redacted] weeks gestation of pregnancy   . Pneumonia due to COVID-19 virus   . Excessive fetal growth affecting management of mother in third trimester, antepartum 11/03/2019  . Supervision of high risk pregnancy, antepartum 05/26/2019  . CIN I (cervical intraepithelial neoplasia I) 04/28/2018  . Fibromyalgia 09/29/2017  . Depressive disorder 09/29/2017  . [place]ative [place] 09/29/[place]  . Low grade squamous intraepith lesion on cytologic smear cervix (lgsil) 04/09/2017    Overnight/24hr events:  No acute events  Subjective:  She notes continued, now non-productive cough, dyspnea improved at rest and is still present with walking to the bathroom.  She notes no fevers, chills, nausea, vomiting, diarrhea. She notes +FM, no LOF, no vaginal bleeding, no contractions.    Objective:   Vitals:   11/14/19 1150 11/14/19 1151  BP:    Pulse: (!) 117 (!) 114  Resp: (!) 29 (!) 22  Temp:    SpO2: 94% 95%   Temp:  [97.6 F (36.4 C)-98.3 F (36.8 C)] 98.3 F (36.8 C) (08/17 1022) Pulse Rate:  [101-147] 114 (08/17 1151) Resp:  [15-37] 22 (08/17 1151) BP: (107-127)/(57-78) 120/70 (08/17 1022) SpO2:  [89 %-100 %] 95 % (08/17 1151) on 5L nasal cannula Temp (24hrs), Avg:98 F (36.7 C), Min:97.6 F (36.4 C), Max:98.3 F (36.8 C)   Intake/Output Summary (Last 24 hours) at 11/14/2019 1412 Last data filed at 11/14/2019 [id number] per 24 hour  Intake 771.62 ml  Output 2250 ml  Net -1478.38 ml     Current Vital Signs 24h Vital  Sign Ranges  T 98.3 F (36.8 C) Temp  Avg: 98 F (36.7 C)  Min: 97.6 F (36.4 C)  Max: 98.3 F (36.8 C)  BP 120/70 BP  Min: 107/71  Max: 127/67  HR (!) 114 Pulse  Avg: 114.6  Min: 101  Max: 147  RR (!) 22 Resp  Avg: 25.5  Min: 15  Max: 37  SaO2 95 % Nasal Cannula SpO2  Avg: 95.4 %  Min: 89 %  Max: 100 %       24 Hour I/O Current Shift I/O  Time Ins Outs 08/16 0701 - 08/17 0700 In: 771.6 [I.V.:771.6] Out: 1800 [Urine:1800] 08/17 0701 - 08/17 1900 In: -  Out: 850 [Urine:850]   Patient Vitals for the past 24 hrs:  BP Temp Temp src Pulse Resp SpO2  11/14/19 1151 -- -- -- (!) 114 (!) 22 95 %  11/14/19 1150 -- -- -- (!) 117 (!) 29 94 %  11/14/19 1149 -- -- -- (!) 115 20 94 %  11/14/19 1148 -- -- -- (!) 115 (!) 27 94 %  11/14/19 1147 -- -- -- (!) 115 (!) 29 94 %  11/14/19 1146 -- -- -- (!) 117 (!) 23 94 %  11/14/19 1145 -- -- -- (!) 117 18 93 %  11/14/19 1144 -- -- -- (!) 119 (!) 24 95 %  11/14/19 1143 -- -- -- (!) 115 (!) 22 94 %  11/14/19 1142 -- -- -- (!) 116 (!) 29 94 %  11/14/19 1141 -- -- -- (!) 115 (!) 30 95 %  11/14/19 1140 -- -- -- (!) 118 (!) 22 94 %  11/14/19 1139 -- -- -- (!) 117 (!) 29 95 %  11/14/19 1138 -- -- -- (!) 121 (!) 24 94 %  11/14/19 1137 -- -- -- (!) 122 (!) 30 92 %  11/14/19 1136 -- -- -- (!) 119 (!) 29 94 %  11/14/19 1135 -- -- -- (!) 128 17 94 %  11/14/19 1134 -- -- -- (!) 122 (!) 32 95 %  11/14/19 1133 -- -- -- (!) 122 (!) 28 93 %  11/14/19 1132 -- -- -- (!) 115 (!) 26 95 %  11/14/19 1131 -- -- -- (!) 115 (!) 24 95 %  11/14/19 1130 -- -- -- (!) 113 (!) 28 95 %  11/14/19 1129 -- -- -- (!) 117 (!) 31 94 %  11/14/19 1128 -- -- -- (!) 117 (!) 23 95 %  11/14/19 1127 -- -- -- (!) 114 (!) 25 95 %  11/14/19 1126 -- -- -- (!) 116 (!) 36 95 %  11/14/19 1125 -- -- -- (!) 113 (!) 25 94 %  11/14/19 1124 -- -- -- (!) 119 20 95 %  11/14/19 1123 -- -- -- (!) 121 (!) 24 94 %  11/14/19 1122 -- -- -- (!) 114 (!) 31 93 %  11/14/19 1121 -- -- -- (!) 114 (!) 29 93 %   11/14/19 1120 -- -- -- (!) 114 (!) 32 92 %  11/14/19 1119 -- -- -- (!) 114 (!) 28 93 %  11/14/19 1118 -- -- -- (!) 115 (!) 32 92 %  11/14/19 1117 -- -- -- (!) 113 (!) 33 94 %  11/14/19 1116 -- -- -- (!) 114 (!) 28 93 %  11/14/19 1115 -- -- -- (!) 114 (!) 31 92 %  11/14/19 1114 -- -- -- (!) 116 (!) 27 93 %  11/14/19 1113 -- -- -- (!) 112 (!) 29 93 %  11/14/19 1112 -- -- -- (!) 110 (!) 32 95 %  11/14/19 1111 -- -- -- (!) 114 (!) 33 93 %  11/14/19 1110 -- -- -- (!) 114 (!) 31 93 %  11/14/19 1109 -- -- -- (!) 113 (!) 26 93 %  11/14/19 1108 -- -- -- (!) 112 (!) 32 93 %  11/14/19 1107 -- -- -- (!) 113 (!) 28 93 %  11/14/19 1106 -- -- -- (!) 111 (!) 30 93 %  11/14/19 1105 -- -- -- (!) 111 (!) 33 93 %  11/14/19 1104 -- -- -- (!) 111 (!) 28 93 %  11/14/19 1103 -- -- -- (!) 112 (!) 31 94 %  11/14/19 1102 -- -- -- (!) 111 (!) 34 94 %  11/14/19 1101 -- -- -- (!) 113 (!) 32 94 %  11/14/19 1100 -- -- -- (!) 114 (!) 35 94 %  11/14/19 1059 -- -- -- (!) 110 (!) 30 93 %  11/14/19 1058 -- -- -- (!) 111 (!) 34 94 %  11/14/19 1057 -- -- -- (!) 109 (!) 31 93 %  11/14/19 1056 -- -- -- (!) 113 (!) 33 93 %  11/14/19 1055 -- -- -- (!) 112 (!) 33 94 %  11/14/19 1054 -- -- -- (!) 112 (!) 32 94 %  11/14/19 1053 -- -- -- (!) 110 (!) 35 94 %  11/14/19 1052 -- -- -- (!) 115 (!) 27 95 %  11/14/19   1051 -- -- -- (!) 110 (!) 32 95 %  11/14/19 1050 -- -- -- (!) 109 (!) 29 94 %  11/14/19 1049 -- -- -- (!) 120 (!) 30 95 %  11/14/19 1048 -- -- -- (!) 112 (!) 31 95 %  11/14/19 1047 -- -- -- (!) 113 (!) 32 95 %  11/14/19 1046 -- -- -- (!) 103 (!) 32 95 %  11/14/19 1045 -- -- -- (!) 109 (!) 31 95 %  11/14/19 1044 -- -- -- (!) 116 (!) 32 94 %  11/14/19 1043 -- -- -- (!) 111 (!) 29 95 %  11/14/19 1042 -- -- -- (!) 106 (!) 32 94 %  11/14/19 1041 -- -- -- (!) 112 (!) 28 95 %  11/14/19 1040 -- -- -- (!) 110 (!) 27 95 %  11/14/19 1039 -- -- -- (!) 114 (!) 28 96 %  11/14/19 1038 -- -- -- (!) 116 (!) 24 93 %  11/14/19  1037 -- -- -- (!) 116 (!) 21 95 %  11/14/19 1036 -- -- -- (!) 117 (!) 24 94 %  11/14/19 1035 -- -- -- (!) 118 (!) 32 94 %  11/14/19 1034 -- -- -- (!) 117 (!) 29 94 %  11/14/19 1033 -- -- -- (!) 118 20 93 %  11/14/19 1032 -- -- -- (!) 120 16 94 %  11/14/19 1031 -- -- -- (!) 117 18 93 %  11/14/19 1030 -- -- -- (!) 121 (!) 30 93 %  11/14/19 1029 -- -- -- (!) 125 (!) 26 94 %  11/14/19 1028 -- -- -- (!) 127 (!) 21 94 %  11/14/19 1027 -- -- -- (!) 124 (!) 31 93 %  11/14/19 1026 -- -- -- (!) 118 18 94 %  11/14/19 1025 -- -- -- (!) 125 (!) 22 94 %  11/14/19 1024 -- -- -- (!) 124 19 94 %  11/14/19 1023 -- -- -- (!) 124 19 94 %  11/14/19 1022 120/70 98.3 F (36.8 C) Oral (!) 126 (!) 32 96 %  11/14/19 1021 -- -- -- (!) 129 (!) 21 96 %  11/14/19 1020 -- -- -- (!) 132 (!) 24 96 %  11/14/19 1019 -- -- -- (!) 146 (!) 30 92 %  11/14/19 1018 -- -- -- (!) 143 (!) 26 95 %  11/14/19 1017 -- -- -- (!) 138 (!) 34 96 %  11/14/19 1016 -- -- -- (!) 147 (!) 25 92 %  11/14/19 1015 -- -- -- (!) 139 (!) 37 90 %  11/14/19 1014 -- -- -- (!) 128 (!) 24 92 %  11/14/19 1013 -- -- -- (!) 120 (!) 29 94 %  11/14/19 1012 -- -- -- (!) 123 (!) 32 94 %  11/14/19 1011 -- -- -- (!) 124 (!) 25 94 %  11/14/19 1010 -- -- -- (!) 117 (!) 29 94 %  11/14/19 1009 -- -- -- (!) 120 (!) 24 94 %  11/14/19 1008 -- -- -- (!) 120 (!) 29 94 %  11/14/19 1007 -- -- -- (!) 121 (!) 29 94 %  11/14/19 1006 -- -- -- (!) 122 (!) 28 95 %  11/14/19 1005 -- -- -- (!) 119 (!) 26 95 %  11/14/19 1004 -- -- -- (!) 118 (!) 31 95 %  11/14/19 1003 -- -- -- (!) 127 (!) 34 95 %  11/14/19 1002 -- -- -- (!) 122 (!) 25 94 %  11/14/19 1001 -- -- -- Marland Kitchen  127 (!) 25 96 %  11/14/19 1000 -- -- -- (!) 127 (!) 27 99 %  11/14/19 0959 -- -- -- (!) 126 (!) 27 98 %  11/14/19 0958 -- -- -- (!) 122 (!) 26 98 %  11/14/19 0957 -- -- -- (!) 127 19 96 %  11/14/19 0956 -- -- -- (!) 130 (!) 26 98 %  11/14/19 0955 -- -- -- (!) 125 (!) 24 100 %  11/14/19 0954 -- -- -- (!)  128 (!) 21 98 %  11/14/19 0953 -- -- -- (!) 125 (!) 27 98 %  11/14/19 0952 -- -- -- (!) 128 20 97 %  11/14/19 0951 -- -- -- (!) 127 18 97 %  11/14/19 0950 -- -- -- (!) 125 20 97 %  11/14/19 0949 -- -- -- (!) 122 (!) 23 97 %  11/14/19 0948 -- -- -- (!) 127 (!) 27 97 %  11/14/19 0947 -- -- -- (!) 131 (!) 27 96 %  11/14/19 0946 -- -- -- (!) 129 19 96 %  11/14/19 0945 -- -- -- (!) 127 (!) 23 96 %  11/14/19 0944 -- -- -- (!) 125 19 92 %  11/14/19 0943 -- -- -- (!) 118 (!) 25 94 %  11/14/19 0942 -- -- -- (!) 117 18 94 %  11/14/19 0941 -- -- -- (!) 120 (!) 21 97 %  11/14/19 0940 -- -- -- (!) 119 (!) 23 96 %  11/14/19 0939 -- -- -- (!) 119 (!) 27 95 %  11/14/19 0938 -- -- -- (!) 116 (!) 30 95 %  11/14/19 0937 -- -- -- (!) 116 (!) 27 95 %  11/14/19 0936 -- -- -- (!) 119 20 96 %  11/14/19 0935 -- -- -- (!) 115 (!) 30 95 %  11/14/19 0934 -- -- -- (!) 116 (!) 32 94 %  11/14/19 0933 -- -- -- (!) 118 (!) 28 97 %  11/14/19 0932 -- -- -- (!) 115 (!) 27 97 %  11/14/19 0931 -- -- -- (!) 117 (!) 28 97 %  11/14/19 0930 -- -- -- (!) 115 (!) 32 97 %  11/14/19 0929 -- -- -- (!) 114 (!) 28 97 %  11/14/19 0928 -- -- -- (!) 114 (!) 27 98 %  11/14/19 0927 -- -- -- (!) 115 (!) 31 97 %  11/14/19 0926 -- -- -- (!) 119 (!) 28 98 %  11/14/19 0925 -- -- -- (!) 117 (!) 24 98 %  11/14/19 0924 -- -- -- (!) 116 19 99 %  11/14/19 0923 -- -- -- (!) 112 (!) 29 98 %  11/14/19 0922 -- -- -- (!) 111 (!) 29 100 %  11/14/19 0921 -- -- -- (!) 111 (!) 30 98 %  11/14/19 0920 -- -- -- (!) 109 (!) 29 98 %  11/14/19 0919 -- -- -- (!) 110 (!) 31 99 %  11/14/19 0918 -- -- -- (!) 109 (!) 29 98 %  11/14/19 0917 -- -- -- (!) 113 (!) 27 99 %  11/14/19 0916 -- -- -- (!) 114 (!) 29 99 %  11/14/19 0915 -- -- -- (!) 113 (!) 30 99 %  11/14/19 0914 -- -- -- (!) 110 (!) 28 98 %  11/14/19 0913 -- -- -- (!) 116 (!) 25 100 %  11/14/19 0912 -- -- -- (!) 116 (!) 27 99 %  11/14/19 0911 -- -- -- (!) 115 (!) 29 99 %  11/14/19 0910 -- -- -- (!)    112 (!) 31 97 %  11/14/19 0909 -- -- -- (!) 112 (!) 29 98 %  11/14/19 0908 -- -- -- (!) 116 (!) 29 100 %  11/14/19 0907 -- -- -- (!) 116 (!) 24 100 %  11/14/19 0906 -- -- -- (!) 114 (!) 27 100 %  11/14/19 0905 -- -- -- (!) 109 (!) 27 99 %  11/14/19 0904 -- -- -- (!) 113 (!) 25 100 %  11/14/19 0903 -- -- -- (!) 113 (!) 27 100 %  11/14/19 0902 -- -- -- (!) 113 (!) 27 99 %  11/14/19 0901 -- -- -- (!) 115 (!) 26 100 %  11/14/19 0900 -- -- -- (!) 120 (!) 27 95 %  11/14/19 0859 -- -- -- (!) 116 (!) 29 100 %  11/14/19 0858 -- -- -- (!) 111 (!) 26 100 %  11/14/19 0857 -- -- -- (!) 114 (!) 27 100 %  11/14/19 0856 -- -- -- (!) 111 (!) 25 100 %  11/14/19 0855 -- -- -- (!) 112 (!) 24 100 %  11/14/19 0854 -- -- -- (!) 116 (!) 30 100 %  11/14/19 0853 -- -- -- (!) 112 (!) 28 100 %  11/14/19 0852 -- -- -- (!) 106 (!) 24 100 %  11/14/19 0851 -- -- -- (!) 111 (!) 27 100 %  11/14/19 0850 -- -- -- (!) 107 (!) 30 100 %  11/14/19 0849 -- -- -- (!) 109 18 97 %  11/14/19 0848 -- -- -- (!) 121 (!) 23 98 %  11/14/19 0847 -- -- -- (!) 112 (!) 25 100 %  11/14/19 0846 -- -- -- (!) 114 (!) 23 99 %  11/14/19 0845 -- -- -- (!) 112 18 99 %  11/14/19 0844 -- -- -- (!) 113 (!) 21 100 %  11/14/19 0843 -- -- -- (!) 112 (!) 25 100 %  11/14/19 0842 -- -- -- (!) 109 (!) 26 99 %  11/14/19 0841 -- -- -- (!) 108 (!) 24 99 %  11/14/19 0840 -- -- -- (!) 108 (!) 28 97 %  11/14/19 0839 -- -- -- (!) 108 18 94 %  11/14/19 0838 -- -- -- (!) 110 (!) 27 98 %  11/14/19 0837 -- -- -- (!) 111 (!) 27 98 %  11/14/19 0836 -- -- -- (!) 111 (!) 26 98 %  11/14/19 0835 -- -- -- (!) 109 (!) 22 97 %  11/14/19 0834 -- -- -- (!) 118 (!) 24 95 %  11/14/19 0833 -- -- -- (!) 116 (!) 21 95 %  11/14/19 0832 -- -- -- (!) 108 (!) 21 97 %  11/14/19 0831 -- -- -- (!) 108 20 95 %  11/14/19 0830 -- -- -- (!) 103 (!) 24 99 %  11/14/19 0829 -- -- -- (!) 112 (!) 22 96 %  11/14/19 0828 -- -- -- (!) 106 (!) 24 94 %  11/14/19 0827 -- -- -- (!) 113 (!) 24  94 %  11/14/19 0826 -- -- -- (!) 117 (!) 22 93 %  11/14/19 0825 -- -- -- (!) 108 (!) 23 95 %  11/14/19 0824 -- -- -- (!) 113 18 94 %  11/14/19 0823 -- -- -- (!) 107 (!) 25 94 %  11/14/19 0822 -- -- -- (!) 113 (!) 29 96 %  11/14/19 0821 -- -- -- (!) 116 (!) 26 94 %  11/14/19 0820 -- -- -- (!) 111 (!) 25 96 %    11/14/19 0819 -- -- -- (!) 126 (!) 23 94 %  11/14/19 0818 -- -- -- (!) 116 (!) 22 94 %  11/14/19 0817 -- -- -- (!) 102 (!) 24 95 %  11/14/19 0816 -- -- -- (!) 114 (!) 23 95 %  11/14/19 0815 -- -- -- (!) 109 (!) 30 95 %  11/14/19 0814 -- -- -- (!) 107 18 96 %  11/14/19 0813 -- -- -- (!) 116 (!) 28 96 %  11/14/19 0812 -- 98 F (36.7 C) Oral (!) 108 (!) 21 96 %  11/14/19 0811 112/78 -- -- (!) 108 (!) 28 95 %  11/14/19 0810 -- -- -- (!) 108 (!) 27 96 %  11/14/19 0809 -- -- -- (!) 112 (!) 21 97 %  11/14/19 0808 -- -- -- (!) 101 (!) 23 98 %  11/14/19 0807 -- -- -- (!) 110 (!) 24 98 %  11/14/19 0806 -- -- -- (!) 111 (!) 23 96 %  11/14/19 0805 -- -- -- (!) 114 (!) 25 97 %  11/14/19 0804 -- -- -- (!) 116 19 100 %  11/14/19 0803 -- -- -- (!) 129 (!) 23 91 %  11/14/19 0802 -- -- -- (!) 129 (!) 29 93 %  11/14/19 0801 -- -- -- (!) 130 (!) 28 96 %  11/14/19 0800 -- -- -- (!) 121 (!) 23 96 %  11/14/19 0759 -- -- -- (!) 126 (!) 34 93 %  11/14/19 0758 -- -- -- (!) 129 (!) 23 95 %  11/14/19 0757 -- -- -- (!) 108 (!) 24 98 %  11/14/19 0756 -- -- -- (!) 111 18 94 %  11/14/19 0755 -- -- -- (!) 112 (!) 22 94 %  11/14/19 0754 -- -- -- (!) 108 (!) 22 94 %  11/14/19 0753 -- -- -- (!) 113 (!) 22 94 %  11/14/19 0752 -- -- -- (!) 110 (!) 22 94 %  11/14/19 0751 -- -- -- (!) 111 (!) 22 94 %  11/14/19 0750 -- -- -- (!) 111 (!) 24 94 %  11/14/19 0749 -- -- -- (!) 107 (!) 21 93 %  11/14/19 0748 -- -- -- (!) 109 (!) 21 93 %  11/14/19 0747 -- -- -- (!) 110 (!) 21 93 %  11/14/19 0746 -- -- -- (!) 108 (!) 23 93 %  11/14/19 0745 -- -- -- (!) 111 (!) 22 93 %  11/14/19 0744 -- -- -- (!) 110 (!) 22 93 %   11/14/19 0743 -- -- -- (!) 108 (!) 23 93 %  11/14/19 0742 -- -- -- (!) 111 (!) 22 93 %  11/14/19 0741 -- -- -- (!) 110 (!) 21 94 %  11/14/19 0740 -- -- -- (!) 110 (!) 21 94 %  11/14/19 0739 -- -- -- (!) 108 (!) 22 93 %  11/14/19 0738 -- -- -- (!) 108 (!) 23 93 %  11/14/19 0737 -- -- -- (!) 110 (!) 21 95 %  11/14/19 0736 -- -- -- (!) 109 (!) 21 94 %  11/14/19 0735 -- -- -- (!) 114 19 93 %  11/14/19 0734 -- -- -- (!) 108 (!) 22 94 %  11/14/19 0733 -- -- -- (!) 110 (!) 22 94 %  11/14/19 0732 -- -- -- (!) 107 (!) 22 94 %  11/14/19 0731 -- -- -- (!) 109 (!) 22 95 %  11/14/19 0730 -- -- -- (!) 111 (!) 23 94 %  11/14/19 0729 -- -- -- (!) 107 (!) 23 94 %  11/14/19 0728 -- -- -- (!) 107 (!) 24 95 %  11/14/19 0727 -- -- -- (!) 106 (!) 22 94 %  11/14/19 0726 -- -- -- (!) 107 (!) 22 94 %  11/14/19 0725 -- -- -- (!) 107 20 94 %  11/14/19 0724 -- -- -- (!) 108 (!) 21 95 %  11/14/19 0723 -- -- -- (!) 106 (!) 22 94 %  11/14/19 0722 -- -- -- (!) 107 (!) 22 95 %  11/14/19 0721 -- -- -- (!) 107 (!) 21 94 %  11/14/19 0720 -- -- -- (!) 106 (!) 22 95 %  11/14/19 0719 -- -- -- (!) 107 (!) 22 95 %  11/14/19 0718 -- -- -- (!) 110 18 94 %  11/14/19 0717 -- -- -- (!) 107 (!) 22 94 %  11/14/19 0716 -- -- -- (!) 107 (!) 23 95 %  11/14/19 0715 -- -- -- (!) 105 (!) 25 94 %  11/14/19 0714 -- -- -- (!) 106 (!) 22 95 %  11/14/19 0713 -- -- -- (!) 107 (!) 22 95 %  11/14/19 0712 -- -- -- (!) 106 (!) 23 95 %  11/14/19 0711 -- -- -- (!) 108 (!) 21 94 %  11/14/19 0710 -- -- -- (!) 108 (!) 23 95 %  11/14/19 0709 -- -- -- (!) 106 (!) 24 95 %  11/14/19 0708 -- -- -- (!) 106 (!) 22 95 %  11/14/19 0707 -- -- -- (!) 105 (!) 25 95 %  11/14/19 0706 -- -- -- (!) 110 (!) 24 95 %  11/14/19 0705 -- -- -- (!) 106 (!) 24 95 %  11/14/19 0704 -- -- -- (!) 106 (!) 25 95 %  11/14/19 0703 -- -- -- (!) 106 (!) 23 95 %  11/14/19 0702 -- -- -- (!) 105 (!) 25 96 %  11/14/19 0701 -- -- -- (!) 104 (!) 25 95 %  11/14/19 0700 -- -- --  (!) 106 (!) 24 95 %  11/14/19 0645 -- -- -- (!) 115 (!) 26 94 %  11/14/19 0630 -- -- -- (!) 113 (!) 22 96 %  11/14/19 0401 -- -- -- (!) 117 (!) 26 --  11/14/19 0350 107/71 98.2 F (36.8 C) Oral (!) 110 (!) 26 94 %  11/14/19 0345 -- -- -- (!) 110 (!) 27 96 %  11/14/19 0330 -- -- -- (!) 114 (!) 24 94 %  11/14/19 0315 -- -- -- (!) 106 (!) 23 95 %  11/14/19 0245 -- -- -- (!) 107 (!) 22 94 %  11/14/19 0230 -- -- -- (!) 106 (!) 23 95 %  11/14/19 0215 -- -- -- (!) 107 (!) 22 93 %  11/14/19 0200 -- -- -- (!) 111 (!) 23 95 %  11/14/19 0145 -- -- -- (!) 108 (!) 23 95 %  11/14/19 0140 -- -- -- (!) 109 (!) 27 95 %  11/14/19 0136 -- -- -- (!) 109 (!) 25 93 %  11/14/19 0110 -- -- -- (!) 110 (!) 28 95 %  11/14/19 0100 -- -- -- (!) 109 (!) 26 94 %  11/14/19 0028 -- -- -- (!) 113 (!) 32 94 %  11/14/19 0027 -- -- -- (!) 118 (!) 29 93 %  11/14/19 0013 -- -- -- (!) 118 (!) 26 96 %  11/14/19 0001 -- -- -- (!) 121 (!) 23 95 %  11/13/19 2205 -- -- -- -- (!) 30 96 %  11/13/19 2204 -- -- -- -- (!) 27 --  11/13/19 2100 -- -- -- -- (!) 33 97 %  11/13/19 2002 127/67 98 F (36.7 C) Oral -- (!) 22 --  11/13/19 1915 -- -- -- -- 18 97 %  11/13/19 1910 -- -- -- -- 20 98 %  11/13/19 1905 -- -- -- -- (!) 21 97 %  11/13/19 1900 (!) 125/57 -- -- -- 20 97 %  11/13/19 1859 -- -- -- -- 20 --  11/13/19 1858 -- -- -- -- (!) 30 --  11/13/19 1857 -- -- -- -- (!) 21 --  11/13/19 1855 -- -- -- -- 20 97 %  11/13/19 1850 -- -- -- -- (!) 24 94 %  11/13/19 1845 -- -- -- -- (!) 26 --  11/13/19 1840 -- -- -- -- (!) 26 --  11/13/19 1835 -- -- -- -- (!) 24 --  11/13/19 1830 -- -- -- -- (!) 23 96 %  11/13/19 1825 -- -- -- -- (!) 22 97 %  11/13/19 1822 -- -- -- -- (!) 26 --  11/13/19 1820 -- -- -- -- (!) 28 98 %  11/13/19 1815 -- -- -- -- 18 98 %  11/13/19 1810 -- -- -- -- (!) 22 95 %  11/13/19 1805 -- -- -- -- (!) 21 98 %  11/13/19 1800 112/61 -- -- -- (!) 30 97 %  11/13/19 1755 -- -- -- -- (!) 31 98 %  11/13/19 1750 -- --  -- -- (!) 27 98 %  11/13/19 1745 -- -- -- -- (!) 29 98 %  11/13/19 1740 -- -- -- -- (!) 27 97 %  11/13/19 1739 -- -- -- -- (!) 25 --  11/13/19 1735 -- -- -- -- (!) 31 97 %  11/13/19 1730 -- -- -- -- (!) 25 96 %  11/13/19 1725 -- -- -- -- (!) 30 96 %  11/13/19 1722 -- 97.6 F (36.4 C) Oral -- -- --  11/13/19 1720 (!) 118/58 -- -- -- (!) 21 98 %  11/13/19 1715 -- -- -- -- 20 96 %  11/13/19 1710 -- -- -- -- (!) 22 97 %  11/13/19 1705 -- -- -- -- (!) 26 96 %  11/13/19 1700 -- -- -- -- (!) 26 96 %  11/13/19 1655 -- -- -- -- (!) 25 96 %  11/13/19 1650 -- -- -- -- (!) 25 97 %  11/13/19 1645 -- -- -- -- (!) 25 96 %  11/13/19 1640 -- -- -- -- (!) 22 97 %  11/13/19 1635 -- -- -- -- (!) 21 97 %  11/13/19 1630 -- -- -- -- (!) 26 95 %  11/13/19 1625 -- -- -- -- (!) 25 98 %  11/13/19 1620 -- -- -- -- (!) 24 95 %  11/13/19 1615 -- -- -- -- (!) 24 96 %  11/13/19 1610 -- -- -- -- (!) 26 95 %  11/13/19 1605 -- -- -- -- 20 95 %  11/13/19 1600 -- -- -- -- (!) 22 96 %  11/13/19 1555 -- -- -- -- (!) 26 96 %  11/13/19 1550 -- -- -- -- (!) 24 96 %  11/13/19 1545 -- -- -- -- (!) 22 97 %  11/13/19 1540 -- -- -- -- 20 96 %  11/13/19 1535 -- -- -- -- (!) 24 96 %  11/13/19 1530 -- -- -- -- Marland Kitchen  22 96 %  11/13/19 1525 -- -- -- -- (!) 24 96 %  11/13/19 1521 -- -- -- -- (!) 27 --  11/13/19 1520 -- -- -- -- (!) 31 96 %  11/13/19 1519 -- -- -- -- (!) 25 --  11/13/19 1518 -- -- -- -- 19 --  11/13/19 1515 -- -- -- -- 15 --  11/13/19 1510 -- -- -- -- (!) 22 --  11/13/19 1505 -- -- -- -- (!) 28 --  11/13/19 1500 119/63 -- -- -- (!) 21 93 %  11/13/19 1459 (!) 125/59 -- -- -- -- --  11/13/19 1455 -- -- -- -- (!) 27 92 %  11/13/19 1450 -- -- -- -- (!) 37 (!) 89 %  11/13/19 1445 -- -- -- -- (!) 34 90 %  11/13/19 1440 -- -- -- -- (!) 32 92 %  11/13/19 1435 -- -- -- -- (!) 32 93 %  11/13/19 1430 -- -- -- -- (!) 24 94 %  11/13/19 1425 -- -- -- -- (!) 30 90 %  11/13/19 1420 -- -- -- -- (!) 33 91 %  11/13/19 1415  -- -- -- -- (!) 34 94 %    Physical exam: General: Well nourished, well developed female in no acute distress. Abdomen: gravid and non-tender Cardiovascular: tachycardic, rhythm regular Respiratory: mild wheezing. No clear rhonchi, some possible decrease in breath sounds at bases.  Extremities: no clubbing, cyanosis or edema Skin: Warm and dry.   Medications: Current Facility-Administered Medications  Medication Dose Route Frequency Provider Last Rate Last Admin  . acetaminophen (TYLENOL) tablet 650 mg  650 mg Oral Q6H PRN Ivor Costa, MD   650 mg at 11/13/19 1952  . albuterol (VENTOLIN HFA) 108 (90 Base) MCG/ACT inhaler 2 puff  2 puff Inhalation Q4H PRN Ivor Costa, MD   2 puff at 11/14/19 1232  . ascorbic acid (VITAMIN C) tablet 500 mg  500 mg Oral Daily Ivor Costa, MD   500 mg at 11/13/19 1127  . balsalazide (COLAZAL) capsule 2,250 mg  2,250 mg Oral Daily Ivor Costa, MD   2,250 mg at 11/13/19 2229  . cholecalciferol (VITAMIN D) tablet 1,000 Units  1,000 Units Oral Daily Ivor Costa, MD   1,000 Units at 11/13/19 1243  . dextromethorphan-guaiFENesin (MUCINEX DM) 30-600 MG per 12 hr tablet 1 tablet  1 tablet Oral BID PRN Ivor Costa, MD   1 tablet at 11/14/19 1009  . DULoxetine (CYMBALTA) DR capsule 60 mg  60 mg Oral Daily Ivor Costa, MD   60 mg at 11/13/19 1243  . enoxaparin (LOVENOX) injection 40 mg  40 mg Subcutaneous Q24H Dalia Heading, CNM   40 mg at 11/14/19 1007  . ferrous sulfate tablet 325 mg  325 mg Oral Q breakfast Ivor Costa, MD      . folic acid (FOLVITE) tablet 1 mg  1 mg Oral Daily Ivor Costa, MD   1 mg at 11/13/19 1243  . ipratropium (ATROVENT HFA) inhaler 2 puff  2 puff Inhalation Q4H Ivor Costa, MD   2 puff at 11/14/19 1233  . methylPREDNISolone sodium succinate (SOLU-MEDROL) 40 mg/mL injection 40 mg  40 mg Intravenous Q12H Ivor Costa, MD   40 mg at 11/14/19 0806  . ondansetron (ZOFRAN) injection 4 mg  4 mg Intravenous Q8H PRN Ivor Costa, MD      . prenatal multivitamin  tablet 1 tablet  1 tablet Oral Q1200 Ivor Costa, MD   1 tablet at 11/13/19 1251  .  remdesivir 100 mg in sodium chloride 0.9 % 100 mL IVPB  100 mg Intravenous Daily Benita Gutter, RPH 200 mL/hr at 11/14/19 1003 100 mg at 11/14/19 1003  . zinc sulfate capsule 220 mg  220 mg Oral Daily Ivor Costa, MD   220 mg at 11/13/19 1127    Labs:  Recent Labs  Lab 11/13/19 0434 11/14/19 0556  WBC 12.7* 10.0  HGB 11.0* 9.3*  HCT 33.1* 27.5*  PLT 270 263    Recent Labs  Lab 11/13/19 0434 11/14/19 0556  NA 133* 134*  K 3.5 3.8  CL 103 107  CO2 20* 18*  BUN <5* <5*  CREATININE 0.38* <0.30*  CALCIUM 8.5* 8.1*  PROT 6.0* 5.0*  BILITOT 0.9 0.8  ALKPHOS 83 82  ALT 10 8  AST 19 16  GLUCOSE 135* 124*     Radiology:  DG Chest 2 View  Result Date: 11/13/2019 CLINICAL DATA:  Pt states that she developed shortness of breat this am. Patient states that she was diagnosed with covid on Thursday. Former smoker. Pt was shielded.shob EXAM: CHEST - 2 VIEW COMPARISON:  None. FINDINGS: Normal cardiac silhouette. There is bilateral streaky airspace disease with a lower lobe predominance. No effusion. No pneumothorax. No acute osseous abnormality. IMPRESSION: Findings consistent with COVID viral pneumonia. Electronically Signed   By: Suzy Bouchard M.D.   On: 11/13/2019 05:55     Assessment & Plan:  29 y.o. G2P0010 female at 102w2dby a 9 week ultrasound, pregnancy complicated by ulcerative colitis.  She is admitted with symptomatic hypoxia due to covid19 pneumonia.  She is currently requiring 5-6 liters by nasal cannula to maintain O2 sats at 95% or better.  *Pregnancy:continue prenatal vitamins, may discontinue continuous fetal monitoring and may perform daily NST, unless clinical condition deteriorates.   * Hypoxia due to covid19 pneumnia:   * O2 supplementation to maintain O2 saturations at >=95%.    (Reference:  https://s3.amazonaws.com/cdn.smfm.org/media/2734/[facility]_COVID_Management_of_COVID_pos_[email or web address]).pdf - see page 7.)  *  Continue remdesivir at current dosing, per hospitalist.  * Continue glucocorticoids   * mAbs per hospitalist  * continue VTE chemoprophylaxis *Preterm: no evidence of labor.  Monitoring of fetus has been reassuring.  Follow up with MFM for growth, per MFM recommendations yesterday *PPx: lovenox *FEN/GI: regular diet, gentle fluids per hospitalist.   *Dispo: home when clinically stable and able to maintain O2 saturations at home.  May require home O2. However, I would recommend decrease in requirement prior to discharge.   A total of 30 minutes were spent face-to-face with the patient as well as preparation, review, communication, and documentation during this encounter.    SPrentice Docker MD  [facility]

## 2019-11-15 DIAGNOSIS — U071 COVID-19: Secondary | ICD-10-CM

## 2019-11-15 DIAGNOSIS — J9601 Acute respiratory failure with hypoxia: Secondary | ICD-10-CM

## 2019-11-15 LAB — CBC WITH DIFFERENTIAL/PLATELET
Abs Immature Granulocytes: 0.4 10*3/uL — ABNORMAL HIGH (ref 0.00–0.07)
Basophils Absolute: 0 10*3/uL (ref 0.0–0.1)
Basophils Relative: 0 %
Eosinophils Absolute: 0 10*3/uL (ref 0.0–0.5)
Eosinophils Relative: 0 %
HCT: 30.5 % — ABNORMAL LOW (ref 36.0–46.0)
Hemoglobin: 9.8 g/dL — ABNORMAL LOW (ref 12.0–15.0)
Immature Granulocytes: 3 %
Lymphocytes Relative: 18 %
Lymphs Abs: 2.2 10*3/uL (ref 0.7–4.0)
MCH: 28.7 pg (ref 26.0–34.0)
MCHC: 32.1 g/dL (ref 30.0–36.0)
MCV: 89.4 fL (ref 80.0–100.0)
Monocytes Absolute: 0.8 10*3/uL (ref 0.1–1.0)
Monocytes Relative: 6 %
Neutro Abs: 8.8 10*3/uL — ABNORMAL HIGH (ref 1.7–7.7)
Neutrophils Relative %: 73 %
Platelets: 322 10*3/uL (ref 150–400)
RBC: 3.41 MIL/uL — ABNORMAL LOW (ref 3.87–5.11)
RDW: 16 % — ABNORMAL HIGH (ref 11.5–15.5)
WBC: 12.2 10*3/uL — ABNORMAL HIGH (ref 4.0–10.5)
nRBC: 0 % (ref 0.0–0.2)

## 2019-11-15 LAB — COMPREHENSIVE METABOLIC PANEL
ALT: 9 U/L (ref 0–44)
AST: 20 U/L (ref 15–41)
Albumin: 2.4 g/dL — ABNORMAL LOW (ref 3.5–5.0)
Alkaline Phosphatase: 86 U/L (ref 38–126)
Anion gap: 11 (ref 5–15)
BUN: <5 mg/dL — ABNORMAL LOW (ref 6–20)
CO2: 20 mmol/L — ABNORMAL LOW (ref 22–32)
Calcium: 8.9 mg/dL (ref 8.9–10.3)
Chloride: 105 mmol/L (ref 98–111)
Creatinine, Ser: 0.4 mg/dL — ABNORMAL LOW (ref 0.44–1.00)
GFR calc Af Amer: >60 mL/min (ref >60)
GFR calc non Af Amer: >60 mL/min (ref >60)
Glucose, Bld: 118 mg/dL — ABNORMAL HIGH (ref 70–99)
Potassium: 4 mmol/L (ref 3.5–5.1)
Sodium: 136 mmol/L (ref 135–145)
Total Bilirubin: 0.6 mg/dL (ref 0.3–1.2)
Total Protein: 5.5 g/dL — ABNORMAL LOW (ref 6.5–8.1)

## 2019-11-15 LAB — FIBRIN DERIVATIVES D-DIMER (ARMC ONLY): Fibrin derivatives D-dimer (ARMC): 1546.65 ng/mL (FEU) — ABNORMAL HIGH (ref 0.00–499.00)

## 2019-11-15 LAB — FERRITIN: Ferritin: 25 ng/mL (ref 11–307)

## 2019-11-15 LAB — GLUCOSE, CAPILLARY
Glucose-Capillary: 168 mg/dL — ABNORMAL HIGH (ref 70–99)
Glucose-Capillary: 126 mg/dL — ABNORMAL HIGH (ref 70–99)

## 2019-11-15 LAB — C-REACTIVE PROTEIN: CRP: 1.3 mg/dL — ABNORMAL HIGH (ref <1.0)

## 2019-11-15 MED ORDER — INSULIN ASPART 100 UNIT/ML ~~LOC~~ SOLN
0.0000 [IU] | Freq: Three times a day (TID) | SUBCUTANEOUS | Status: DC
  Administered 2019-11-15 – 2019-11-16 (×2): 2 [IU] via SUBCUTANEOUS
  Administered 2019-11-16: 1 [IU] via SUBCUTANEOUS
  Administered 2019-11-16: 2 [IU] via SUBCUTANEOUS
  Filled 2019-11-15 (×4): qty 1

## 2019-11-15 NOTE — Progress Notes (Signed)
Dr Glennon Mac notified throughout the night of pts O2 saturation. Pt remains on 5L [place]. Sat range from 91 to 98%.

## 2019-11-15 NOTE — Progress Notes (Signed)
Obstetric and Gynecology  Subjective  Feeling about the same as at admission.  Still getting winded going to bathroom.  Oxygen requirement stable at 5L [place]  Objective  Vital signs in last 24 hours: Temp:  [97.8 F (36.6 C)-98.4 F (36.9 C)] 98.4 F (36.9 C) (08/18 1051) Pulse Rate:  [100-141] 118 (08/18 1051) Resp:  [14-36] 27 (08/18 1051) BP: (91-112)/(45-74) 103/74 (08/18 1050) SpO2:  [85 %-99 %] 96 % (08/18 1051)     Intake/Output Summary (Last 24 hours) at 11/15/2019 1207 Last data filed at 11/15/2019 0600 Gross per 24 hour  Intake 1200 ml  Output 1900 ml  Net -700 ml    Labs: Results for orders placed or performed during the hospital encounter of 11/13/19 (from the past 24 hour(s))  CBC with Differential/Platelet     Status: Abnormal   Collection Time: 11/15/19  6:22 AM  Result Value Ref Range   WBC 12.2 (H) 4.0 - 10.5 K/uL   RBC 3.41 (L) 3.87 - 5.11 MIL/uL   Hemoglobin 9.8 (L) 12.0 - 15.0 g/dL   HCT 30.5 (L) 36 - 46 %   MCV 89.4 80.0 - 100.0 fL   MCH 28.7 26.0 - 34.0 pg   MCHC 32.1 30.0 - 36.0 g/dL   RDW 16.0 (H) 11.5 - 15.5 %   Platelets 322 150 - 400 K/uL   nRBC 0.0 0.0 - 0.2 %   Neutrophils Relative % 73 %   Neutro Abs 8.8 (H) 1.7 - 7.7 K/uL   Lymphocytes Relative 18 %   Lymphs Abs 2.2 0.7 - 4.0 K/uL   Monocytes Relative 6 %   Monocytes Absolute 0.8 0 - 1 K/uL   Eosinophils Relative 0 %   Eosinophils Absolute 0.0 0 - 0 K/uL   Basophils Relative 0 %   Basophils Absolute 0.0 0 - 0 K/uL   Immature Granulocytes 3 %   Abs Immature Granulocytes 0.40 (H) 0.00 - 0.07 K/uL  Comprehensive metabolic panel     Status: Abnormal   Collection Time: 11/15/19  6:22 AM  Result Value Ref Range   Sodium 136 135 - 145 mmol/L   Potassium 4.0 3.5 - 5.1 mmol/L   Chloride 105 98 - 111 mmol/L   CO2 20 (L) 22 - 32 mmol/L   Glucose, Bld 118 (H) 70 - 99 mg/dL   BUN <5 (L) 6 - 20 mg/dL   Creatinine, Ser 0.40 (L) 0.44 - 1.00 mg/dL   Calcium 8.9 8.9 - 10.3 mg/dL   Total Protein  5.5 (L) 6.5 - 8.1 g/dL   Albumin 2.4 (L) 3.5 - 5.0 g/dL   AST 20 15 - 41 U/L   ALT 9 0 - 44 U/L   Alkaline Phosphatase 86 38 - 126 U/L   Total Bilirubin 0.6 0.3 - 1.2 mg/dL   GFR calc non Af Amer >60 >60 mL/min   GFR calc Af Amer >60 >60 mL/min   Anion gap 11 5 - 15  C-reactive protein     Status: Abnormal   Collection Time: 11/15/19  6:22 AM  Result Value Ref Range   CRP 1.3 (H) <1.0 mg/dL  Fibrin derivatives D-Dimer ([facility] only)     Status: Abnormal   Collection Time: 11/15/19  6:22 AM  Result Value Ref Range   Fibrin derivatives D-dimer ([facility]) 1,546.65 (H) 0.00 - 499.00 ng/mL (FEU)  Ferritin     Status: None   Collection Time: 11/15/19  6:22 AM  Result Value Ref Range   Ferritin  25 11 - 307 ng/mL    Cultures: Results for orders placed or performed during the hospital encounter of 11/13/19  Culture, sputum-assessment     Status: None   Collection Time: 11/13/19 11:09 AM   Specimen: Expectorated Sputum  Result Value Ref Range Status   Specimen Description EXPECTORATED SPUTUM  Final   Special Requests NONE  Final   Sputum evaluation   Final    THIS SPECIMEN IS ACCEPTABLE FOR SPUTUM CULTURE Performed at [facility], [address] [place], [place] [address]    Report Status 11/13/2019 FINAL  Final  Culture, respiratory     Status: None (Preliminary result)   Collection Time: 11/13/19 11:09 AM  Result Value Ref Range Status   Specimen Description   Final    EXPECTORATED SPUTUM Performed at [facility], [address], [place], [place] [address]    Special Requests   Final    NONE Reflexed from [id number] Performed at [facility], [address] [place], [place] [address]    Gram Stain   Final    ABUNDANT WBC PRESENT, PREDOMINANTLY PMN RARE GRAM POSITIVE COCCI    Culture   Final    CULTURE REINCUBATED FOR BETTER GROWTH Performed at [facility], [place] [address] [place], [place] [address]    Report Status PENDING  Incomplete   Culture, blood (Routine X 2) w Reflex to ID Panel     Status: None (Preliminary result)   Collection Time: 11/13/19 11:11 AM   Specimen: BLOOD  Result Value Ref Range Status   Specimen Description BLOOD RIGHT AC  Final   Special Requests   Final    BOTTLES DRAWN AEROBIC AND ANAEROBIC Blood Culture results may not be optimal due to an excessive volume of blood received in culture bottles   Culture   Final    NO GROWTH 2 DAYS Performed at [facility], [address], [place], [place] [address]    Report Status PENDING  Incomplete  Culture, blood (Routine X 2) w Reflex to ID Panel     Status: None (Preliminary result)   Collection Time: 11/13/19 11:11 AM   Specimen: BLOOD  Result Value Ref Range Status   Specimen Description BLOOD LEFT AC  Final   Special Requests   Final    BOTTLES DRAWN AEROBIC AND ANAEROBIC Blood Culture results may not be optimal due to an excessive volume of blood received in culture bottles   Culture   Final    NO GROWTH 2 DAYS Performed at [facility], [address], [place], [place] [address]    Report Status PENDING  Incomplete    Imaging:  Assessment   29 y.o. G2P0010 at 88w3dby Estimated Date of Delivery: 01/14/20 hospital day 1 COVID pneumonia   Plan   1) COVID PNA - continue remdesivir  - continue solumedrol - continue prn Mucinex DM  2) FEN - BG checks given solumdedrol  3) DVT ppx  - Lovenox  4) Fetus continue NST's, growth scan by MFM tomorrow

## 2019-11-15 NOTE — Hospital Course (Addendum)
29 year old woman pregnant 31-week, 1 day at time of admission, PMH ulcerative colitis, iron deficiency anemia, fibromyalgia presented with cough and shortness of breath.  Admitted for COVID-19 pneumonia with acute hypoxic respiratory failure.  Seen by maternal-fetal medicine and admitted by OB.  Currently on 5 L.  Being treated with steroids, remdesivir.    Desats with ambulation.  Up to 7L on nonrebreather.

## 2019-11-15 NOTE — Progress Notes (Signed)
PROGRESS NOTE  Stephanie Ayala HFW:[id number] DOB: Dec 20, 1990 DOA: 11/13/2019 PCP: [facility]  Brief History   29 year old woman pregnant 31-week, 1 day at time of admission, PMH ulcerative colitis, iron deficiency anemia, fibromyalgia presented with cough and shortness of breath.  Admitted for COVID-19 pneumonia with acute hypoxic respiratory failure.  Seen by maternal-fetal medicine and admitted by OB.  Currently on 5 L.  Being treated with steroids, remdesivir.  A & P  No problem-specific Assessment & Plan notes found for this encounter.  Acute hypoxic respiratory failure secondary to COVID-19 pneumonia in 31-week pregnant woman.   --Appreciate maternal-fetal medicine and OB --Continue treatment with remdesivir, steroids. --Oxygen requirement appears to be currently stable on 5 L.  Wean as tolerated. --No evidence of bacterial infection --Not a candidate for Actemra  [redacted] weeks gestation of pregnancy -Continue management per primary team OB/GYN  Ulcerative colitis ([place]) -Appears stable, continue balsalazide  Iron deficiency anemia:  -Stable.  Follow-up as an outpatient.  Depressive disorder: -Appears stable continue Cymbalta  Disposition Plan:  Discussion: Overall appears stable.  Continue remdesivir and steroids.  Wean oxygen as tolerated.  Still gets short of breath with movement.  No opportunity for discharge yet.  Status is: Inpatient  Remains inpatient appropriate because:Inpatient level of care appropriate due to severity of illness   Dispo: The patient is from: Home              Anticipated d/c is to: Home              Anticipated d/c date is: 2 days              Patient currently is not medically stable to d/c.  DVT prophylaxis: enoxaparin (LOVENOX) injection 40 mg Start: 11/13/19 2200 Place TED hose Start: 11/13/19 0277 Code Status: Full Family Communication: husband at bedside  Murray Hodgkins, MD  [facility] Direct contact: see  [email or web address] (further directions at bottom of note if needed) 7PM-7AM contact night coverage as at bottom of note 11/15/2019, 10:25 AM  LOS: 1 day   Significant Hospital Events   .    Consults:  .    Procedures:  .   Significant Diagnostic Tests:  Marland Kitchen    Micro Data:  .    Antimicrobials:  .   Interval History/Subjective  Feels ok but gets short of breath with movement.  Tolerating diet.  Some pain in throat from coughing. RN at bedside, patient remains on 5 L nasal cannula.  Sinus tachycardia up to 130s at times.  Objective   Vitals:  Vitals:   11/15/19 0836 11/15/19 0840  BP:  106/63  Pulse:  (!) 134  Resp:  20  Temp:  98.2 F (36.8 C)  SpO2: 97%     Exam:  Constitutional:   . Appears calm and comfortable sitting in chair, SOB but no acute distress ENMT:  . grossly normal hearing  Respiratory:  . CTA bilaterally, no w/r/r but breath sounds diminished . Respiratory effort mildly increased.  Cardiovascular:  . tachycardic, no m/r/g . No LE extremity edema   Psychiatric:  . Mental status o Mood, affect appropriate  I have personally reviewed the following:   Today's Data  . Complete metabolic panel unremarkable. . Hemoglobin stable at 9.8.  WBC mildly elevated 12.2. . CRP 3.1 yesterday.  Ferritin 25 today. . Fibrin derivatives slightly higher at 1546  Scheduled Meds: . vitamin C  500 mg Oral Daily  . balsalazide  2,250 mg Oral  Daily  . cholecalciferol  1,000 Units Oral Daily  . DULoxetine  60 mg Oral Daily  . enoxaparin (LOVENOX) injection  40 mg Subcutaneous Q24H  . ferrous sulfate  325 mg Oral Q breakfast  . folic acid  1 mg Oral Daily  . ipratropium  2 puff Inhalation Q4H  . methylPREDNISolone (SOLU-MEDROL) injection  40 mg Intravenous Q12H  . prenatal multivitamin  1 tablet Oral Q1200  . zinc sulfate  220 mg Oral Daily   Continuous Infusions: . remdesivir 100 mg in NS 100 mL 100 mg (11/15/19 0955)    Principal Problem:   Acute hypoxemic  respiratory failure due to COVID-19 [facility]) Active Problems:   Ulcerative colitis ([place])   Pneumonia due to COVID-19 virus   [redacted] weeks gestation of pregnancy   Iron deficiency anemia   Depressive disorder   Lower respiratory tract infection due to COVID-19 virus   LOS: 1 day   How to contact the [facility] Attending or Consulting provider 7A - 7P or covering provider during after hours [place], for this patient?  1. Check the care team in [facility] and look for a) attending/consulting TRH provider listed and b) the [facility] team listed 2. Log into [email or web address] and use [place]'s universal password to access. If you do not have the password, please contact the hospital operator. 3. Locate the [facility] provider you are looking for under [facility] and page to a number that you can be directly reached. 4. If you still have difficulty reaching the provider, please page the [facility] (Director on Call) for the Hospitalists listed on amion for assistance.

## 2019-11-16 ENCOUNTER — Inpatient Hospital Stay: Payer: Medicaid Other

## 2019-11-16 ENCOUNTER — Encounter: Payer: Medicaid Other | Admitting: Certified Nurse Midwife

## 2019-11-16 DIAGNOSIS — D508 Other iron deficiency anemias: Secondary | ICD-10-CM

## 2019-11-16 LAB — COMPREHENSIVE METABOLIC PANEL
ALT: 11 U/L (ref 0–44)
AST: 22 U/L (ref 15–41)
Albumin: 2.2 g/dL — ABNORMAL LOW (ref 3.5–5.0)
Alkaline Phosphatase: 79 U/L (ref 38–126)
Anion gap: 11 (ref 5–15)
BUN: <5 mg/dL — ABNORMAL LOW (ref 6–20)
CO2: 23 mmol/L (ref 22–32)
Calcium: 8.6 mg/dL — ABNORMAL LOW (ref 8.9–10.3)
Chloride: 106 mmol/L (ref 98–111)
Creatinine, Ser: 0.51 mg/dL (ref 0.44–1.00)
GFR calc Af Amer: >60 mL/min (ref >60)
GFR calc non Af Amer: >60 mL/min (ref >60)
Glucose, Bld: 117 mg/dL — ABNORMAL HIGH (ref 70–99)
Potassium: 3.8 mmol/L (ref 3.5–5.1)
Sodium: 140 mmol/L (ref 135–145)
Total Bilirubin: 0.9 mg/dL (ref 0.3–1.2)
Total Protein: 5.1 g/dL — ABNORMAL LOW (ref 6.5–8.1)

## 2019-11-16 LAB — C-REACTIVE PROTEIN: CRP: 0.6 mg/dL (ref <1.0)

## 2019-11-16 LAB — CBC WITH DIFFERENTIAL/PLATELET
Abs Immature Granulocytes: 0.41 10*3/uL — ABNORMAL HIGH (ref 0.00–0.07)
Basophils Absolute: 0 10*3/uL (ref 0.0–0.1)
Basophils Relative: 0 %
Eosinophils Absolute: 0 10*3/uL (ref 0.0–0.5)
Eosinophils Relative: 0 %
HCT: 27.5 % — ABNORMAL LOW (ref 36.0–46.0)
Hemoglobin: 9 g/dL — ABNORMAL LOW (ref 12.0–15.0)
Immature Granulocytes: 4 %
Lymphocytes Relative: 18 %
Lymphs Abs: 2.1 10*3/uL (ref 0.7–4.0)
MCH: 29.6 pg (ref 26.0–34.0)
MCHC: 32.7 g/dL (ref 30.0–36.0)
MCV: 90.5 fL (ref 80.0–100.0)
Monocytes Absolute: 0.9 10*3/uL (ref 0.1–1.0)
Monocytes Relative: 8 %
Neutro Abs: 8.2 10*3/uL — ABNORMAL HIGH (ref 1.7–7.7)
Neutrophils Relative %: 70 %
Platelets: 316 10*3/uL (ref 150–400)
RBC: 3.04 MIL/uL — ABNORMAL LOW (ref 3.87–5.11)
RDW: 15.9 % — ABNORMAL HIGH (ref 11.5–15.5)
Smear Review: NORMAL
WBC: 11.6 10*3/uL — ABNORMAL HIGH (ref 4.0–10.5)
nRBC: 0 % (ref 0.0–0.2)

## 2019-11-16 LAB — GLUCOSE, CAPILLARY
Glucose-Capillary: 107 mg/dL — ABNORMAL HIGH (ref 70–99)
Glucose-Capillary: 115 mg/dL — ABNORMAL HIGH (ref 70–99)
Glucose-Capillary: 140 mg/dL — ABNORMAL HIGH (ref 70–99)
Glucose-Capillary: 124 mg/dL — ABNORMAL HIGH (ref 70–99)

## 2019-11-16 LAB — FERRITIN: Ferritin: 23 ng/mL (ref 11–307)

## 2019-11-16 LAB — FIBRIN DERIVATIVES D-DIMER (ARMC ONLY): Fibrin derivatives D-dimer (ARMC): 1438.36 ng/mL (FEU) — ABNORMAL HIGH (ref 0.00–499.00)

## 2019-11-16 NOTE — Progress Notes (Signed)
PROGRESS NOTE  PEG FIFER QQV:[id number] DOB: [birth year] DOA: 11/13/2019 PCP: [facility]  Brief History   29 year old woman pregnant 31-week, 1 day at time of admission, PMH ulcerative colitis, iron deficiency anemia, fibromyalgia presented with cough and shortness of breath.  Admitted for COVID-19 pneumonia with acute hypoxic respiratory failure.  Seen by maternal-fetal medicine and admitted by OB.  Currently on 5 L.  Being treated with steroids, remdesivir.  A & P  Acute hypoxic respiratory failure secondary to COVID-19 pneumonia in 31-week pregnant woman.   --Still hypoxic requiring 4 L.  Quite tachypneic with ambulation.  Continue steroids.  Completed remdesivir. --Inflammatory markers trending down --Wean oxygen as tolerated --Not a candidate for Actemra  [redacted] weeks gestation of pregnancy -Continue management per primary team OB/GYN  Ulcerative colitis ([place]) -Appears stable, continue balsalazide  Iron deficiency anemia:  -Stable.  Follow-up as an outpatient.  Depressive disorder: -Appears stable continue Cymbalta  Disposition Plan:  Discussion: Appears stable but does get quite tachypneic and tachycardic with ambulation.  Still requiring 4 to 5 L nasal cannula.  No opportunity for discharge yet.  Status is: Inpatient  Remains inpatient appropriate because:Inpatient level of care appropriate due to severity of illness   Dispo: The patient is from: Home              Anticipated d/c is to: Home              Anticipated d/c date is: 2 days              Patient currently is not medically stable to d/c.  DVT prophylaxis: enoxaparin (LOVENOX) injection 40 mg Start: 11/13/19 2200 Place TED hose Start: 11/13/19 3329 Code Status: Full Family Communication: Mother by telephone with patient permission  Murray Hodgkins, MD  [facility] Direct contact: see [email or web address] (further directions at bottom of note if needed) 7PM-7AM contact night coverage as at  bottom of note 11/16/2019, 2:00 PM  LOS: 2 days   Significant Hospital Events   .    Consults:  .    Procedures:  .   Significant Diagnostic Tests:  Marland Kitchen    Micro Data:  .    Antimicrobials:  .   Interval History/Subjective  Feels okay today but does get quite short of breath with ambulation.  Nursing had to increase oxygen to 5 L temporarily after ambulating.  Objective   Vitals:  Vitals:   11/16/19 1358 11/16/19 1359  BP:    Pulse: (!) 109 (!) 112  Resp: (!) 26 (!) 22  Temp:    SpO2: 93% 93%    Exam: Constitutional:   . Appears calm and comfortable . On 4L Respiratory:  . CTA bilaterally, no w/r/r.  . Respiratory effort normal.  Cardiovascular:  . RRR, no m/r/g . No LE extremity edema   Psychiatric:  . Mental status o Mood, affect appropriate   I have personally reviewed the following:   Today's Data  . CBG stable . CMP unremarkable . Hgb stable at 9 . CRP down to 0.6 . Fibrin down to 1438  Scheduled Meds: . vitamin C  500 mg Oral Daily  . balsalazide  2,250 mg Oral Daily  . cholecalciferol  1,000 Units Oral Daily  . DULoxetine  60 mg Oral Daily  . enoxaparin (LOVENOX) injection  40 mg Subcutaneous Q24H  . ferrous sulfate  325 mg Oral Q breakfast  . folic acid  1 mg Oral Daily  . insulin aspart  0-14  Units Subcutaneous TID PC  . ipratropium  2 puff Inhalation Q4H  . methylPREDNISolone (SOLU-MEDROL) injection  40 mg Intravenous Q12H  . prenatal multivitamin  1 tablet Oral Q1200  . zinc sulfate  220 mg Oral Daily   Continuous Infusions: . remdesivir 100 mg in NS 100 mL 100 mg (11/16/19 1135)    Principal Problem:   Acute hypoxemic respiratory failure due to COVID-19 [facility]) Active Problems:   Ulcerative colitis ([place])   Pneumonia due to COVID-19 virus   [redacted] weeks gestation of pregnancy   Iron deficiency anemia   Depressive disorder   Lower respiratory tract infection due to COVID-19 virus   LOS: 2 days   How to contact the [facility]  Attending or Consulting provider 7A - 7P or covering provider during after hours [place], for this patient?  1. Check the care team in [facility] and look for a) attending/consulting TRH provider listed and b) the [facility] team listed 2. Log into [email or web address] and use [place]'s universal password to access. If you do not have the password, please contact the hospital operator. 3. Locate the [facility] provider you are looking for under [facility] and page to a number that you can be directly reached. 4. If you still have difficulty reaching the provider, please page the [facility] (Director on Call) for the Hospitalists listed on amion for assistance.

## 2019-11-16 NOTE — Progress Notes (Signed)
Pt. desating to 89%-93% on 5L O2 with nasal cannula. Upon assessment, pt. was laying flat and stated a nare was clogged. Her position was changed to high fowler's (per pt request) as she attempted to clear her nose. Oxygen changed to 6L. Pt. Alert and oriented and reports no SOB. Pt. Encouraged to lay in prone position after returning from bathroom.

## 2019-11-16 NOTE — Progress Notes (Signed)
Stephanie Ayala is a 29 y.o. female patient.  1. Lower respiratory tract infection due to COVID-19 virus   2. [redacted] weeks gestation of pregnancy   3. Supervision of high risk pregnancy, antepartum     Subjective: She is feeling well. She is resting in bed. She coughs with ambulation. She reports the feels fatigued, but otherwise the same as yesterday. She does not yet feel like she is improving, but her symptoms have not worsened. She is feeling fetal movement. She denies contractions, LOF, vaginal bleeding.   Objective:  Past Medical History:  Diagnosis Date   Autoimmune disease ([place])    Colitis    Depression    Fibromyalgia     Current Facility-Administered Medications  Medication Dose Route Frequency Provider Last Rate Last Admin   acetaminophen (TYLENOL) tablet 650 mg  650 mg Oral Q6H PRN Ivor Costa, MD   650 mg at 11/15/19 2208   albuterol (VENTOLIN HFA) 108 (90 Base) MCG/ACT inhaler 2 puff  2 puff Inhalation Q4H PRN Ivor Costa, MD   2 puff at 11/16/19 7893   ascorbic acid (VITAMIN C) tablet 500 mg  500 mg Oral Daily Ivor Costa, MD   500 mg at 11/15/19 2206   balsalazide (COLAZAL) capsule 2,250 mg  2,250 mg Oral Daily Ivor Costa, MD   2,250 mg at 11/15/19 2206   cholecalciferol (VITAMIN D) tablet 1,000 Units  1,000 Units Oral Daily Ivor Costa, MD   1,000 Units at 11/15/19 2208   dextromethorphan-guaiFENesin ([place] DM) 30-600 MG per 12 hr tablet 1 tablet  1 tablet Oral BID PRN Ivor Costa, MD   1 tablet at 11/16/19 1132   DULoxetine (CYMBALTA) DR capsule 60 mg  60 mg Oral Daily Ivor Costa, MD   60 mg at 11/15/19 2207   enoxaparin (LOVENOX) injection 40 mg  40 mg Subcutaneous Q24H Dalia Heading, CNM   40 mg at 11/16/19 1132   ferrous sulfate tablet 325 mg  325 mg Oral Q breakfast Ivor Costa, MD   325 mg at 81/01/75 1025   folic acid (FOLVITE) tablet 1 mg  1 mg Oral Daily Ivor Costa, MD   1 mg at 11/15/19 2207   insulin aspart (novoLOG) injection 0-14 Units  0-14 Units  Subcutaneous TID Sherral Hammers, MD   1 Units at 11/16/19 1135   ipratropium (ATROVENT HFA) inhaler 2 puff  2 puff Inhalation Q4H Ivor Costa, MD   2 puff at 11/16/19 1249   methylPREDNISolone sodium succinate (SOLU-MEDROL) 40 mg/mL injection 40 mg  40 mg Intravenous Q12H Ivor Costa, MD   40 mg at 11/16/19 1127   ondansetron (ZOFRAN) injection 4 mg  4 mg Intravenous Q8H PRN Ivor Costa, MD       prenatal multivitamin tablet 1 tablet  1 tablet Oral Q1200 Ivor Costa, MD   1 tablet at 11/15/19 2206   remdesivir 100 mg in sodium chloride 0.9 % 100 mL IVPB  100 mg Intravenous Daily Benita Gutter, RPH 200 mL/hr at 11/16/19 1135 100 mg at 11/16/19 1135   zinc sulfate capsule 220 mg  220 mg Oral Daily Ivor Costa, MD   220 mg at 11/15/19 2212   No Known Allergies Principal Problem:   Acute hypoxemic respiratory failure due to COVID-19 [facility]) Active Problems:   Depressive disorder   Ulcerative colitis ([place])   [redacted] weeks gestation of pregnancy   Pneumonia due to COVID-19 virus   Iron deficiency anemia   Lower respiratory tract infection due to  COVID-19 virus  Blood pressure 107/66, pulse (!) 115, temperature 98.7 F (37.1 C), temperature source Oral, resp. rate (!) 26, height 5' 2"  (1.575 m), weight 91.6 kg, last menstrual period 04/02/2019, SpO2 91 %.  Review of Systems  Constitutional: Negative for chills and fever.  HENT: Negative for congestion, hearing loss and sinus pain.   Respiratory: Negative for cough, shortness of breath and wheezing.   Cardiovascular: Negative for chest pain, palpitations and leg swelling.  Gastrointestinal: Negative for abdominal pain, constipation, diarrhea, nausea and vomiting.  Genitourinary: Negative for dysuria, flank pain, frequency, hematuria and urgency.  Musculoskeletal: Negative for back pain.  Skin: Negative for rash.  Neurological: Negative for dizziness and headaches.  Psychiatric/Behavioral: Negative for suicidal ideas. The patient is not  nervous/anxious.     Physical Exam Vitals and nursing note reviewed.  Constitutional:      Appearance: She is well-developed.  HENT:     Head: Normocephalic and atraumatic.  Eyes:     Pupils: Pupils are equal, round, and reactive to light.  Cardiovascular:     Rate and Rhythm: Regular rhythm. Tachycardia present.  Pulmonary:     Effort: Pulmonary effort is normal. No respiratory distress.     Comments: On 3 L [place], saturation 91-91, will increase to 5L  Skin:    General: Skin is warm and dry.  Neurological:     Mental Status: She is alert and oriented to person, place, and time.  Psychiatric:        Behavior: Behavior normal.        Thought Content: Thought content normal.        Judgment: Judgment normal.   No evidence of DVT on exam. Legs equal, no swelling   Assessment and Plan: 29 yo G2P0010 40w4dwith covid-19 pneumonia  1. Acute hypoxic respiratory failure secondary to COVID-19 pneumonia  - maintain SpO2 at 95% or greater  - continue remdesivir and steroids - continue incentive spirometer, albuterol and atrovent PRN, vitamin C, zinc, mucinex 2. Pregnancy - PNV - NST q shift - on insulin sliding scale for glucose control in setting of pregnancy and steroid use 3. Hx ulcerative Colitis -Continue balsalazide 4. History depression -Continue cymbalta 5. Diet- Regular 6. DVT prophylaxis- Lovenox  Patient is not stable for discharge, continued oxygen requirements. Continue inpatient care.   Yvette Roark R Eathan Groman 11/16/2019

## 2019-11-17 ENCOUNTER — Encounter: Payer: Self-pay | Admitting: Obstetrics and Gynecology

## 2019-11-17 ENCOUNTER — Inpatient Hospital Stay
Admit: 2019-11-17 | Discharge: 2019-11-17 | Disposition: A | Discharge status: Home / Self Care | Payer: Medicaid Other | Attending: Family Medicine | Admitting: Family Medicine

## 2019-11-17 ENCOUNTER — Inpatient Hospital Stay: Payer: Medicaid Other

## 2019-11-17 DIAGNOSIS — Z3A31 31 weeks gestation of pregnancy: Secondary | ICD-10-CM

## 2019-11-17 LAB — CBC WITH DIFFERENTIAL/PLATELET
Abs Immature Granulocytes: 0.41 10*3/uL — ABNORMAL HIGH (ref 0.00–0.07)
Basophils Absolute: 0 10*3/uL (ref 0.0–0.1)
Basophils Relative: 0 %
Eosinophils Absolute: 0 10*3/uL (ref 0.0–0.5)
Eosinophils Relative: 0 %
HCT: 27.4 % — ABNORMAL LOW (ref 36.0–46.0)
Hemoglobin: 9.1 g/dL — ABNORMAL LOW (ref 12.0–15.0)
Immature Granulocytes: 3 %
Lymphocytes Relative: 14 %
Lymphs Abs: 2 10*3/uL (ref 0.7–4.0)
MCH: 29.1 pg (ref 26.0–34.0)
MCHC: 33.2 g/dL (ref 30.0–36.0)
MCV: 87.5 fL (ref 80.0–100.0)
Monocytes Absolute: 0.9 10*3/uL (ref 0.1–1.0)
Monocytes Relative: 6 %
Neutro Abs: 10.7 10*3/uL — ABNORMAL HIGH (ref 1.7–7.7)
Neutrophils Relative %: 77 %
Platelets: 345 10*3/uL (ref 150–400)
RBC: 3.13 MIL/uL — ABNORMAL LOW (ref 3.87–5.11)
RDW: 15.9 % — ABNORMAL HIGH (ref 11.5–15.5)
WBC: 14 10*3/uL — ABNORMAL HIGH (ref 4.0–10.5)
nRBC: 0.1 % (ref 0.0–0.2)

## 2019-11-17 LAB — ECHOCARDIOGRAM COMPLETE
AR max vel: 2.16 cm2
AV Area VTI: 2.51 cm2
AV Area mean vel: 2.11 cm2
AV Mean grad: 6 mmHg
AV Peak grad: 12 mmHg
Ao pk vel: 1.73 m/s
Area-P 1/2: 8.72 cm2
Height: 62 in
S' Lateral: 2.99 cm
Weight: 3232 oz

## 2019-11-17 LAB — FERRITIN: Ferritin: 18 ng/mL (ref 11–307)

## 2019-11-17 LAB — CULTURE, RESPIRATORY W GRAM STAIN: Culture: NORMAL

## 2019-11-17 LAB — COMPREHENSIVE METABOLIC PANEL
ALT: 14 U/L (ref 0–44)
AST: 23 U/L (ref 15–41)
Albumin: 2.2 g/dL — ABNORMAL LOW (ref 3.5–5.0)
Alkaline Phosphatase: 83 U/L (ref 38–126)
Anion gap: 10 (ref 5–15)
BUN: 6 mg/dL (ref 6–20)
CO2: 23 mmol/L (ref 22–32)
Calcium: 8.4 mg/dL — ABNORMAL LOW (ref 8.9–10.3)
Chloride: 104 mmol/L (ref 98–111)
Creatinine, Ser: 0.37 mg/dL — ABNORMAL LOW (ref 0.44–1.00)
GFR calc Af Amer: >60 mL/min (ref >60)
GFR calc non Af Amer: >60 mL/min (ref >60)
Glucose, Bld: 125 mg/dL — ABNORMAL HIGH (ref 70–99)
Potassium: 3.8 mmol/L (ref 3.5–5.1)
Sodium: 137 mmol/L (ref 135–145)
Total Bilirubin: 0.9 mg/dL (ref 0.3–1.2)
Total Protein: 5.3 g/dL — ABNORMAL LOW (ref 6.5–8.1)

## 2019-11-17 LAB — GLUCOSE, CAPILLARY
Glucose-Capillary: 106 mg/dL — ABNORMAL HIGH (ref 70–99)
Glucose-Capillary: 159 mg/dL — ABNORMAL HIGH (ref 70–99)
Glucose-Capillary: 125 mg/dL — ABNORMAL HIGH (ref 70–99)
Glucose-Capillary: 128 mg/dL — ABNORMAL HIGH (ref 70–99)

## 2019-11-17 LAB — EXPECTORATED SPUTUM ASSESSMENT W GRAM STAIN, RFLX TO RESP C

## 2019-11-17 LAB — FIBRIN DERIVATIVES D-DIMER (ARMC ONLY): Fibrin derivatives D-dimer (ARMC): 1352.64 ng/mL (FEU) — ABNORMAL HIGH (ref 0.00–499.00)

## 2019-11-17 LAB — C-REACTIVE PROTEIN: CRP: 0.5 mg/dL (ref <1.0)

## 2019-11-17 MED ORDER — SODIUM CHLORIDE 0.9 % IV SOLN
500.0000 mg | INTRAVENOUS | Status: DC
  Administered 2019-11-17: 500 mg via INTRAVENOUS
  Filled 2019-11-17 (×2): qty 500

## 2019-11-17 MED ORDER — SODIUM CHLORIDE 0.9 % IV SOLN: INTRAVENOUS | Status: DC

## 2019-11-17 MED ORDER — INSULIN ASPART 100 UNIT/ML ~~LOC~~ SOLN
0.0000 [IU] | Freq: Three times a day (TID) | SUBCUTANEOUS | Status: DC
  Administered 2019-11-17 (×3): 2 [IU] via SUBCUTANEOUS
  Filled 2019-11-17 (×3): qty 1

## 2019-11-17 MED ORDER — IOHEXOL 350 MG/ML SOLN
75.0000 mL | Freq: Once | INTRAVENOUS | Status: AC | PRN
  Administered 2019-11-17: 75 mL via INTRAVENOUS

## 2019-11-17 MED ORDER — SODIUM CHLORIDE 0.9 % IV SOLN
2.0000 g | INTRAVENOUS | Status: DC
  Administered 2019-11-17: 2 g via INTRAVENOUS
  Filled 2019-11-17: qty 20
  Filled 2019-11-17: qty 2

## 2019-11-17 NOTE — Progress Notes (Signed)
[facility] arrived, gave report to RN, Warden/ranger. Pt's questions answered and in stable transport condition.

## 2019-11-17 NOTE — Progress Notes (Signed)
Consult with [facility], Danise Mina CNM and Bambi, respiratory practitioner. Plan for respiratory to assess pt and put patient on high flow nasal cannula. At 1702, respiratory therapist put HFNC on 6 L/min and reports Fi02 being 44%. Danise Mina, CNM reported this information to the [facility] team and patient has been accepted for transfer. RN received call from [facility] that patient has a bed space at [place] [address]. RN called Web designer and they stated they will come pick up the patient around 2000-2100 tonight.

## 2019-11-17 NOTE — Progress Notes (Addendum)
Patient called out to inform RN that she was going to the bathroom. Patient 02 sats dropped to mid-upper 80s and heart rate increased to the 140s. Gilman Schmidt, MD and Danise Mina, [place] notified of this. MD consulting MFM.

## 2019-11-17 NOTE — Consult Note (Signed)
Name: Stephanie Ayala MRN: [id number] DOB: 11/08/1990    ADMISSION DATE:  11/13/2019 CONSULTATION DATE:  11/17/2019  REFERRING MD :  Dr Sarajane Jews  CHIEF COMPLAINT:  Shortness of Breath, Cough  BRIEF PATIENT DESCRIPTION:  29 y.o. Pregnant Female (31 week, 1 day at time of admission) admitted with Acute Hypoxic Respiratory Failure secondary to COVID-19 Pneumonia.  SIGNIFICANT EVENTS  8/16: Admission  8/20: Increasing FiO2 requirements; PCCM consulted  STUDIES:  8/16: CXR>>Normal cardiac silhouette. There is bilateral streaky airspace disease with a lower lobe predominance. No effusion. No pneumothorax. No acute osseous abnormality. 8/19: CXR>>Mild areas of linear atelectasis, with small multifocal infiltrates, are seen within the bilateral lung bases. While the atelectatic changes are increased in severity when compared to the prior study, the multifocal infiltrates are decreased in severity. There is no evidence of a pleural effusion or pneumothorax. The heart size and mediastinal contours are within normal limits. The visualized skeletal structures are unremarkable. 8/20: CTA Chest>>1. No CT findings for pulmonary embolism. 2. Normal thoracic aorta. 3. Patchy bilateral ground-glass infiltrates consistent with COVID pneumonia. 4. Moderate eventration of the right hemidiaphragm with overlying vascular crowding and atelectasis. 5. Suspect diffuse fatty infiltration of the liver.  CULTURES: Blood culture x2 8/16>> no growth to date Sputum 8/16>>RARE GRAM POSITIVE COCCI Sputum 8/20>>  ANTIBIOTICS/ANTIVIRALS: Remdesivir 8/16>>8/20 Azithromycin 8/20>> Ceftriaxone 8/20>>  HISTORY OF PRESENT ILLNESS:    29 yo with COVID Patient is [redacted] weeks pregnant, +fevers, cough and SOB Dx with COVID on Friday the 13th, developed symptoms 3 days later Admitted and found to have b/l infiltrates on CXR CT chest obtained NEG for PE Patient is alert and awake, no resp distress Comfortable on  NRB mask, feels better, but oxygen levels have been dropping Patient completed REMDESIVIR, Currently on steroids CRP is decreasing  Patient with h/o VAPING ECHO pending     PAST MEDICAL HISTORY :   has a past medical history of Autoimmune disease ([place]), Colitis, Depression, and Fibromyalgia.  has a past surgical history that includes Ovary surgery (Bilateral). Prior to Admission medications   Medication Sig Start Date End Date Taking? Authorizing Provider  acetaminophen (TYLENOL) 500 MG tablet Take 1,000 mg by mouth every 6 (six) hours as needed.   Yes [provider]  balsalazide (COLAZAL) 750 MG capsule Take 2,250 mg by mouth at bedtime.   Yes [provider]  cholecalciferol (VITAMIN D3) 25 MCG (1000 UNIT) tablet Take 1,000 Units by mouth daily. Pt unsure of dose.   Yes [provider]  DULoxetine (CYMBALTA) 60 MG capsule Take 1 capsule (60 mg total) by mouth daily. 10/18/19 10/17/20 Yes Schuman, Christanna R, MD  ferrous sulfate 325 (65 FE) MG tablet Take 325 mg by mouth daily with breakfast.   Yes [provider]  folic acid (FOLVITE) 1 MG tablet Take 1 mg by mouth daily. Pt unsure of dose.  Will check and notify next visit.   Yes [provider]  guaiFENesin (MUCINEX) 600 MG 12 hr tablet Take 600 mg by mouth 2 (two) times daily as needed for cough or to loosen phlegm.   Yes [provider]  Prenatal Vit-Fe Fumarate-FA (PRENATAL MULTIVITAMIN) TABS tablet Take 1 tablet by mouth daily at 12 noon.   Yes [provider]   No Known Allergies  FAMILY HISTORY:  family history includes Alcohol abuse in her maternal grandfather; Diabetes in her father and paternal uncle; Heart disease in her paternal grandfather and paternal uncle; Hypertension in her father.  SOCIAL HISTORY:  reports that she has quit smoking. She smoked 0.00 packs per day. She has never used smokeless tobacco. She reports previous alcohol use. She reports that she  does not use drugs.   COVID-19 DISASTER DECLARATION:  FULL CONTACT PHYSICAL EXAMINATION WAS NOT POSSIBLE DUE TO TREATMENT OF COVID-19 AND  CONSERVATION OF PERSONAL PROTECTIVE EQUIPMENT, LIMITED EXAM FINDINGS INCLUDE-  Patient assessed or the symptoms described in the history of present illness.  In the context of the Global COVID-19 pandemic, which necessitated consideration that the patient might be at risk for infection with the SARS-CoV-2 virus that causes COVID-19, Institutional protocols and algorithms that pertain to the evaluation of patients at risk for COVID-19 are in a state of rapid change based on information released by regulatory bodies including the CDC and federal and state organizations. These policies and algorithms were followed during the patient's care while in hospital.  REVIEW OF SYSTEMS:   Constitutional: Negative for fever, chills, weight loss, malaise/fatigue and diaphoresis.  HENT: Negative for hearing loss, ear pain, nosebleeds, congestion, sore throat, neck pain, tinnitus and ear discharge.   Eyes: Negative for blurred vision, double vision, photophobia, pain, discharge and redness.  Respiratory: +cough, -hemoptysis, sputum production, +shortness of breath, +wheezing and -stridor.   Cardiovascular: Negative for chest pain, palpitations, orthopnea, claudication, leg swelling and PND.  Gastrointestinal: Negative for heartburn, nausea, vomiting, abdominal pain, diarrhea, constipation, blood in stool and melena.  Genitourinary: Negative for dysuria, urgency, frequency, hematuria and flank pain.  Musculoskeletal: Negative for myalgias, back pain, joint pain and falls.  Skin: Negative for itching and rash.  Neurological: Negative for dizziness, tingling, tremors, sensory change, speech change, focal weakness, seizures, loss of consciousness, weakness and headaches.  Endo/Heme/Allergies: Negative for environmental allergies and polydipsia. Does not bruise/bleed  easily.  SUBJECTIVE:   VITAL SIGNS: Temp:  [97.8 F (36.6 C)-98.5 F (36.9 C)] 97.9 F (36.6 C) (08/20 1244) Pulse Rate:  [96-150] 119 (08/20 1330) Resp:  [12-39] 24 (08/20 1330) BP: (103-116)/(56-65) 103/56 (08/20 1244) SpO2:  [85 %-99 %] 95 % (08/20 1330)  Physical Examination:   General Appearance: No distress  Neuro:without focal findings,  speech normal,  HEENT: PERRLA, EOM intact.   PULM/CARDIAC-UNABLE TO HEAR WITH BACKGROUND NOISE GU:  Not performed at this time. Endoc: No evident thyromegaly Skin:   warm, no rashes, no ecchymosis  Extremities: normal, no cyanosis, clubbing. PSYCHIATRIC: Mood, affect within normal limits.   ALL OTHER ROS ARE NEGATIVE   Recent Labs  Lab 11/15/19 0622 11/16/19 0624 11/17/19 0700  NA 136 140 137  K 4.0 3.8 3.8  CL 105 106 104  CO2 20* 23 23  BUN <5* <5* 6  CREATININE 0.40* 0.51 0.37*  GLUCOSE 118* 117* 125*   Recent Labs  Lab 11/15/19 0622 11/16/19 0624 11/17/19 0700  HGB 9.8* 9.0* 9.1*  HCT 30.5* 27.5* 27.4*  WBC 12.2* 11.6* 14.0*  PLT 322 316 345   CT ANGIO CHEST PE W OR WO CONTRAST  Result Date: 11/17/2019 CLINICAL DATA:  29 year old female is [redacted] weeks pregnant. COVID pneumonia with worsening shortness of breath. EXAM: CT ANGIOGRAPHY CHEST WITH CONTRAST TECHNIQUE: Multidetector CT imaging of the chest was performed using the standard protocol during bolus administration of intravenous contrast. Multiplanar CT image reconstructions and MIPs were obtained to evaluate the vascular anatomy. CONTRAST:  33m OMNIPAQUE IOHEXOL 350 MG/ML SOLN COMPARISON:  Chest x-ray 11/16/2019 FINDINGS: Cardiovascular: The heart is normal in size. No pericardial effusion. The aorta is normal in caliber. No  dissection. The branch vessels are patent. The pulmonary arterial tree is fairly well opacified. No definite filling defects to suggest pulmonary embolism. Mediastinum/Nodes: No mediastinal or hilar mass or adenopathy. Small scattered lymph  nodes are likely reactive. The esophagus is unremarkable. Lungs/Pleura: Patchy bilateral ground-glass infiltrates consistent with COVID pneumonia. There is moderate eventration of the right hemidiaphragm with overlying vascular crowding and atelectasis. No definite pleural effusions. No pneumothorax. Upper Abdomen: No significant upper abdominal findings. Suspect diffuse fatty infiltration of the liver. Musculoskeletal: No breast masses, supraclavicular or axillary adenopathy. The thyroid gland appears normal. The bony thorax is intact. Review of the MIP images confirms the above findings. IMPRESSION: 1. No CT findings for pulmonary embolism. 2. Normal thoracic aorta. 3. Patchy bilateral ground-glass infiltrates consistent with COVID pneumonia. 4. Moderate eventration of the right hemidiaphragm with overlying vascular crowding and atelectasis. 5. Suspect diffuse fatty infiltration of the liver. Electronically Signed   By: Marijo Sanes M.D.   On: 11/17/2019 13:36   DG Chest Port 1 View  Result Date: 11/16/2019 CLINICAL DATA:  COVID positive with hypoxia. EXAM: PORTABLE CHEST 1 VIEW COMPARISON:  November 13, 2019 FINDINGS: Mild areas of linear atelectasis, with small multifocal infiltrates, are seen within the bilateral lung bases. While the atelectatic changes are increased in severity when compared to the prior study, the multifocal infiltrates are decreased in severity. There is no evidence of a pleural effusion or pneumothorax. The heart size and mediastinal contours are within normal limits. The visualized skeletal structures are unremarkable. IMPRESSION: Mild bibasilar linear atelectasis, with small multifocal infiltrates, increased in severity when compared to the prior study. Electronically Signed   By: Virgina Norfolk M.D.   On: 11/16/2019 18:21    ASSESSMENT / PLAN:  Acute Hypoxic Respiratory Failure in the setting of COVID-19 Pneumonia -Supplemental O2 as needed to maintain O2 sats  >88% -Completed 5 day course of Remdesivir -Continue Steroids -Follow inflammatory markers: Ferritin, D-dimer, CRP, IL-6, LDH -Assess for possible tocilizumab  -Vitamin C, zinc -Prn Bronchodilators & Antitussives -Continue Azithromycin & Ceftriaxone -Follow Respiratory cultures -CTA Chest 11/17/19 NEGATIVE for PE -Encourage self-proning as able as long as ok with OB/GYN Recommend ECHO Highly recommend Transfer to [place], M.D.  [facility] Heckler [facility]  Medical Director [place] Director [facility] Cardio-Pulmonary Department

## 2019-11-17 NOTE — Progress Notes (Addendum)
PROGRESS NOTE  ZYIAH WITHINGTON ZWC:[id number] DOB: 1990-05-25 DOA: 11/13/2019 PCP: [facility]  Brief History   29 year old woman pregnant 31-week, 1 day at time of admission, PMH ulcerative colitis, iron deficiency anemia, fibromyalgia presented with cough and shortness of breath.  Admitted for COVID-19 pneumonia with acute hypoxic respiratory failure.  Seen by maternal-fetal medicine and admitted by OB.  Currently on 5 L.  Being treated with steroids, remdesivir.    Desats with ambulation.  Up to 7L on nonrebreather.   A & P  Acute hypoxic respiratory failure secondary to COVID-19 pneumonia in 31-week pregnant woman.   --more hypoxic overnight. SpO2 mid to high 90s on NRB. Appears comfortable. CXR yesterday w/ atelectasis and worse infiltrate on the right. Afebrile, WBC high but on steroids. No fever or s/s of bacterial infection but given worsening, will recommend starting empriic ceftriaxone and azithromycin if ok w/ attending.  --Ferritin, fibrin der. Trending down.  --CRP was normal yesterday --finished remdesivir today --continue steriods --Not a candidate for Actemra  [redacted] weeks gestation of pregnancy -Continue management per primary team OB/GYN  Ulcerative colitis ([place]) -Appears stable, continue balsalazide  Iron deficiency anemia:  -remains stable.  Follow-up as an outpatient.  Depressive disorder: -continue Cymbalta  Disposition Plan:  Discussion: appears about the same, but oxygen requirement worse and infiltrate worse, will d/w OB and rec adding abx as bove.  Addendum Discussed in detail w/ attending service Clarita Leber, also d/w Dr. Mortimer Fries. Added abx though suspicion for bacterial pneumonia is low. CTA chest negative for PE, but c/w COVID. Echo unremarkable. Continue current Rx, will defer pulm management to pulmonology. Ok to transfer from general medical standpoint.   Status is: Inpatient  Remains inpatient appropriate because:Inpatient level  of care appropriate due to severity of illness   Dispo: The patient is from: Home              Anticipated d/c is to: Home              Anticipated d/c date is: 2 days              Patient currently is not medically stable to d/c.  DVT prophylaxis: enoxaparin (LOVENOX) injection 40 mg Start: 11/13/19 2200 Place TED hose Start: 11/13/19 2353 Code Status: Full Family Communication: none present or requested  Murray Hodgkins, MD  [facility] Direct contact: see [email or web address] (further directions at bottom of note if needed) 7PM-7AM contact night coverage as at bottom of note 11/17/2019, 10:07 AM  LOS: 3 days   Significant Hospital Events   .    Consults:  . OB -- Attending . MFM   Procedures:  .   Significant Diagnostic Tests:  Marland Kitchen    Micro Data:  .    Antimicrobials:  .   Interval History/Subjective  Desats with ambulation.  Up to 7L on nonrebreather.  Feels ok so long as lying bed; hard to breath deeply SOB and hypoxic w/ ambulation Tolerating diet No pain in legs Objective   Vitals:  Vitals:   11/17/19 0930 11/17/19 1004  BP:  (!) 104/58  Pulse: (!) 107 (!) 114  Resp: (!) 26 (!) 23  Temp:  97.8 F (36.6 C)  SpO2: 98% 96%    Exam: Constitutional:   . Appears calm and comfortable lying in bed on NRB ENMT:  . grossly normal hearing  Respiratory:  . CTA bilaterally, no w/r/r.  . Respiratory effort mildly increased; speaks in short sentences Cardiovascular:  .  tachycardic, no m/r/g . No LE extremity edema   . Normal pedal pulses Neurologic:  . Grossly unremarkable Psychiatric:  . Mental status o Mood, affect appropriate  I have personally reviewed the following:   Today's Data  . CBG stable . CMP unremarkable . Ferritin down to 18 . Hgb stable at 9.1 . WBC 14 on steriods . Fibrin d. Down to 1352 . CXR 8/19 bibasilar atelectasis, small multifocal infiltrates, worse  Scheduled Meds: . vitamin C  500 mg Oral Daily  . balsalazide  2,250  mg Oral Daily  . cholecalciferol  1,000 Units Oral Daily  . DULoxetine  60 mg Oral Daily  . enoxaparin (LOVENOX) injection  40 mg Subcutaneous Q24H  . ferrous sulfate  325 mg Oral Q breakfast  . folic acid  1 mg Oral Daily  . insulin aspart  0-14 Units Subcutaneous TID PC  . ipratropium  2 puff Inhalation Q4H  . methylPREDNISolone (SOLU-MEDROL) injection  40 mg Intravenous Q12H  . prenatal multivitamin  1 tablet Oral Q1200  . zinc sulfate  220 mg Oral Daily   Continuous Infusions: . remdesivir 100 mg in NS 100 mL 100 mg (11/17/19 1002)    Principal Problem:   Acute hypoxemic respiratory failure due to COVID-19 [facility]) Active Problems:   Ulcerative colitis ([place])   Pneumonia due to COVID-19 virus   [redacted] weeks gestation of pregnancy   Iron deficiency anemia   Depressive disorder   Lower respiratory tract infection due to COVID-19 virus   LOS: 3 days   How to contact the [facility] Attending or Consulting provider 7A - 7P or covering provider during after hours [place], for this patient?  1. Check the care team in [facility] and look for a) attending/consulting TR[facility] provider listed and b) the [facility] team listed 2. Log into [email or web address] and use [place]'s universal password to access. If you do not have the password, please contact the hospital operator. 3. Locate the [facility] provider you are looking for under [facility] and page to a number that you can be directly reached. 4. If you still have difficulty reaching the provider, please page the [facility] (Director on Call) for the Hospitalists listed on amion for assistance.

## 2019-11-17 NOTE — Progress Notes (Signed)
Patient to CT for evaluation. Transport team transported patient to [place] gave verbal order for patient to be off of telemetry monitoring for patient to go to CT. Patient on 7 L/min with nonrebreather for transport.

## 2019-11-17 NOTE — Progress Notes (Signed)
RN assessed patient this morning and she states that she feels about the same. RN gave patient medications and patient used incentive spirometer with RN. RN encouraged patient to do this often.

## 2019-11-17 NOTE — Progress Notes (Signed)
*  PRELIMINARY RESULTS* Echocardiogram 2D Echocardiogram has been performed.  Stephanie Ayala 11/17/2019, 3:04 PM

## 2019-11-17 NOTE — Progress Notes (Signed)
L&D Progress Note 29 y.o. Pregnant Female (31 week, 5 days gestation admitted with Acute Hypoxic Respiratory Failure secondary to COVID-19 Pneumonia. This is HD #5. Has been on Remdesivir and Solumedrol x 5 days. Was continuing to desat  and having tachycardia to 130s to 140s when OOB to BR. Was switched from nasal canula to NRB mask during the night. Although inflammatory markers are improving the repeat chest Xray last night did not show any significant improvement. She was begun on Rocephin and Azithromycin IV incase there is a bacterial component to her pneumonia. Dr Patricia Pesa, pulmonologist was consulted and he recommended a CT scan to rule out pulmonary embolism and that was negative. She also had a echo done and results are pending. Dr Mortimer Fries recommended referral to a tertiary center in case of worsening condition needing intensive care and MFM assessment   S: Feeling like she is breathing easier on the  mask. No chest pain. Baby moving well. Denies contractions. Voiding without difficulty Prefers to be transferred to [facility].   O: BP (!) 103/56 (BP Location: Left Arm)   Pulse (!) 124   Temp 97.9 F (36.6 C) (Oral)   Resp (!) 26   Ht 5' 2"  (1.575 m)   Wt 91.6 kg   LMP 04/02/2019   SpO2 99%   BMI 36.95 kg/m   On 7 L. O2 with NRB mask General: appears to be in NAD, talking at a normal rate  Lungs/ Chest: CTAB, no wheezing, rales or rhonchi, but difficult to hear with background noise Heart: Tachycardic without murmur Neurologic: awake and alert Extremities: no LE tenderness or redness  Psyche: normal mood, full affect  FHR: 150 baseline with accelerations to 160s to 170s, moderate variability Toco: some irritability  Labs Results for orders placed or performed during the hospital encounter of 11/13/19 (from the past 24 hour(s))  Glucose, capillary     Status: Abnormal   Collection Time: 11/16/19  6:08 PM  Result Value Ref Range   Glucose-Capillary 140 (H) 70 - 99 mg/dL  Glucose,  capillary     Status: Abnormal   Collection Time: 11/16/19  8:59 PM  Result Value Ref Range   Glucose-Capillary 124 (H) 70 - 99 mg/dL  CBC with Differential/Platelet     Status: Abnormal   Collection Time: 11/17/19  7:00 AM  Result Value Ref Range   WBC 14.0 (H) 4.0 - 10.5 K/uL   RBC 3.13 (L) 3.87 - 5.11 MIL/uL   Hemoglobin 9.1 (L) 12.0 - 15.0 g/dL   HCT 27.4 (L) 36 - 46 %   MCV 87.5 80.0 - 100.0 fL   MCH 29.1 26.0 - 34.0 pg   MCHC 33.2 30.0 - 36.0 g/dL   RDW 15.9 (H) 11.5 - 15.5 %   Platelets 345 150 - 400 K/uL   nRBC 0.1 0.0 - 0.2 %   Neutrophils Relative % 77 %   Neutro Abs 10.7 (H) 1.7 - 7.7 K/uL   Lymphocytes Relative 14 %   Lymphs Abs 2.0 0.7 - 4.0 K/uL   Monocytes Relative 6 %   Monocytes Absolute 0.9 0 - 1 K/uL   Eosinophils Relative 0 %   Eosinophils Absolute 0.0 0 - 0 K/uL   Basophils Relative 0 %   Basophils Absolute 0.0 0 - 0 K/uL   Immature Granulocytes 3 %   Abs Immature Granulocytes 0.41 (H) 0.00 - 0.07 K/uL  Comprehensive metabolic panel     Status: Abnormal   Collection Time: 11/17/19  7:00 AM  Result Value Ref Range   Sodium 137 135 - 145 mmol/L   Potassium 3.8 3.5 - 5.1 mmol/L   Chloride 104 98 - 111 mmol/L   CO2 23 22 - 32 mmol/L   Glucose, Bld 125 (H) 70 - 99 mg/dL   BUN 6 6 - 20 mg/dL   Creatinine, Ser 0.37 (L) 0.44 - 1.00 mg/dL   Calcium 8.4 (L) 8.9 - 10.3 mg/dL   Total Protein 5.3 (L) 6.5 - 8.1 g/dL   Albumin 2.2 (L) 3.5 - 5.0 g/dL   AST 23 15 - 41 U/L   ALT 14 0 - 44 U/L   Alkaline Phosphatase 83 38 - 126 U/L   Total Bilirubin 0.9 0.3 - 1.2 mg/dL   GFR calc non Af Amer >60 >60 mL/min   GFR calc Af Amer >60 >60 mL/min   Anion gap 10 5 - 15  C-reactive protein     Status: None   Collection Time: 11/17/19  7:00 AM  Result Value Ref Range   CRP 0.5 <1.0 mg/dL  Fibrin derivatives D-Dimer ([facility] only)     Status: Abnormal   Collection Time: 11/17/19  7:00 AM  Result Value Ref Range   Fibrin derivatives D-dimer ([facility]) 1,352.64 (H) 0.00 - 499.00  ng/mL (FEU)  Ferritin     Status: None   Collection Time: 11/17/19  7:00 AM  Result Value Ref Range   Ferritin 18 11 - 307 ng/mL  Glucose, capillary     Status: Abnormal   Collection Time: 11/17/19  9:40 AM  Result Value Ref Range   Glucose-Capillary 106 (H) 70 - 99 mg/dL  Expectorated sputum assessment w rflx to resp cult     Status: None   Collection Time: 11/17/19 10:24 AM   Specimen: Sputum  Result Value Ref Range   Specimen Description SPU    Special Requests NONE    Sputum evaluation      THIS SPECIMEN IS ACCEPTABLE FOR SPUTUM CULTURE Performed at [facility], [address] [place], [place] [address]    Report Status 11/17/2019 FINAL   Glucose, capillary     Status: Abnormal   Collection Time: 11/17/19 12:25 PM  Result Value Ref Range   Glucose-Capillary 159 (H) 70 - 99 mg/dL   DG Chest 2 View  Result Date: 11/13/2019 CLINICAL DATA:  Pt states that she developed shortness of breat this am. Patient states that she was diagnosed with covid on Thursday. Former smoker. Pt was shielded.shob EXAM: CHEST - 2 VIEW COMPARISON:  None. FINDINGS: Normal cardiac silhouette. There is bilateral streaky airspace disease with a lower lobe predominance. No effusion. No pneumothorax. No acute osseous abnormality. IMPRESSION: Findings consistent with COVID viral pneumonia. Electronically Signed   By: Suzy Bouchard M.D.   On: 11/13/2019 05:55   CT ANGIO CHEST PE W OR WO CONTRAST  Result Date: 11/17/2019 CLINICAL DATA:  29 year old female is [redacted] weeks pregnant. COVID pneumonia with worsening shortness of breath. EXAM: CT ANGIOGRAPHY CHEST WITH CONTRAST TECHNIQUE: Multidetector CT imaging of the chest was performed using the standard protocol during bolus administration of intravenous contrast. Multiplanar CT image reconstructions and MIPs were obtained to evaluate the vascular anatomy. CONTRAST:  73m OMNIPAQUE IOHEXOL 350 MG/ML SOLN COMPARISON:  Chest x-ray 11/16/2019 FINDINGS:  Cardiovascular: The heart is normal in size. No pericardial effusion. The aorta is normal in caliber. No dissection. The branch vessels are patent. The pulmonary arterial tree is fairly well opacified. No definite filling defects  to suggest pulmonary embolism. Mediastinum/Nodes: No mediastinal or hilar mass or adenopathy. Small scattered lymph nodes are likely reactive. The esophagus is unremarkable. Lungs/Pleura: Patchy bilateral ground-glass infiltrates consistent with COVID pneumonia. There is moderate eventration of the right hemidiaphragm with overlying vascular crowding and atelectasis. No definite pleural effusions. No pneumothorax. Upper Abdomen: No significant upper abdominal findings. Suspect diffuse fatty infiltration of the liver. Musculoskeletal: No breast masses, supraclavicular or axillary adenopathy. The thyroid gland appears normal. The bony thorax is intact. Review of the MIP images confirms the above findings. IMPRESSION: 1. No CT findings for pulmonary embolism. 2. Normal thoracic aorta. 3. Patchy bilateral ground-glass infiltrates consistent with COVID pneumonia. 4. Moderate eventration of the right hemidiaphragm with overlying vascular crowding and atelectasis. 5. Suspect diffuse fatty infiltration of the liver. Electronically Signed   By: Marijo Sanes M.D.   On: 11/17/2019 13:36   US OB Follow Up  Result Date: 11/06/2019 Patient Name: KIRAT MEZQUITA DOB: 18-Aug-1990 MRN: [id number] ULTRASOUND REPORT Location: [facility] Date of Service: 11/03/2019 Indications:growth/afi Findings: Nelda Marseille intrauterine pregnancy is visualized with FHR at 167 BPM. Biometrics give an (U/S) Gestational age of 76w4dand an (U/S) EDD of 01/01/2020; this correlates with the clinically established Estimated Date of Delivery: 01/14/20. Fetal presentation is Cephalic. Placenta: anterior. Grade: 1 AFI: 15.1 cm Growth percentile is 80.8%.  AC percentile is >97.7%. EFW: 1856 g  ( 4 lb 1 oz ) Impression: 1. 247w5diable  Singleton Intrauterine pregnancy previously established criteria. 2. Growth is 80.8 %ile.  AFI is 15.1 cm. Recommendations: 1.Clinical correlation with the patient's History and Physical Exam. ElGweneth DimitriRT Review of ULTRASOUND.    [facility] have personally reviewed images and [facility]   Plan of management to be discussed with patient. PaBarnett ApplebaumMD, FALoura Pardonb/Gyn, [facility] 11/06/2019  10:31 AM  DG Chest Port 1 View  Result Date: 11/16/2019 CLINICAL DATA:  COVID positive with hypoxia. EXAM: PORTABLE CHEST 1 VIEW COMPARISON:  November 13, 2019 FINDINGS: Mild areas of linear atelectasis, with small multifocal infiltrates, are seen within the bilateral lung bases. While the atelectatic changes are increased in severity when compared to the prior study, the multifocal infiltrates are decreased in severity. There is no evidence of a pleural effusion or pneumothorax. The heart size and mediastinal contours are within normal limits. The visualized skeletal structures are unremarkable. IMPRESSION: Mild bibasilar linear atelectasis, with small multifocal infiltrates, increased in severity when compared to the prior study. Electronically Signed   By: ThVirgina Norfolk.D.   On: 11/16/2019 18:21   [place]FM OB DETAIL +14 WK  Result Date: 10/26/2019 ----------------------------------------------------------------------  OBSTETRICS REPORT                       (Signed Final 10/26/2019 03:44 pm) ---------------------------------------------------------------------- Patient Info  ID #:       [id number]                        D.O.B.:  27 yrs)  Name:       [facility] [place] [facility] [place]                 Visit Date: 10/26/2019 02:00 pm ---------------------------------------------------------------------- Performed By  Attending:        CoSander Nephew    Ref. Address:      [address]  [place]                                                               [place], [place],                                                              [place] [address]  Performed By:     Marco Collie           [facility]                                                              [facility] [place]  Referred By:      Gae Dry MD ---------------------------------------------------------------------- Orders  #  Description                           Code        Ordered By  1  [place] MFM OB DETAIL +14 [place]               768[address].01    [facility] ----------------------------------------------------------------------  #  Order #                     Accession #                Episode #  1  [id number]                   [id number]                 [id number] ---------------------------------------------------------------------- Indications  [redacted] weeks gestation of pregnancy  Z3A.28  Fibromyalgia  Depression  Ulcerative Colitis ---------------------------------------------------------------------- Fetal Evaluation  Num Of Fetuses:          1  Fetal Heart              145  Rate(bpm):  Cardiac Activity:        Observed  Presentation:            Breech  Placenta:                Anterior  P. Cord Insertion:       Visualized, central  AFI Sum(cm)     %Tile       Largest Pocket(cm)  16.8            62          6.3  RUQ(cm)       RLQ(cm)       LUQ(cm)        LLQ(cm)  5.1           0             5.4            6.3 ---------------------------------------------------------------------- Biometry  BPD:      76.9  mm     G. Age:  30w 6d         94  %    CI:         74.63  %    70 - 86                                                          FL/HC:       17.5  %    19.6 - 20.8  HC:      282.5  mm     G. Age:  31w 0d         86  %    HC/AC:       1.03       0.99 -  1.21  AC:      275.6  mm     G. Age:  31w 4d         99  %    FL/BPD:      64.4  %    71 - 87  FL:       49.5  mm     G. Age:  26w 5d          3  %    FL/AC:       18.0  %    20 - 24  HUM:      47.1  mm     G. Age:  27w 5d         27  %  CER:      34.8  mm     G. Age:  29w 2d         71  %  LV:        5.9  mm  Est. FW:    1489   g      3 lb 5 oz     87  %                     m ---------------------------------------------------------------------- Gestational Age  LMP:           28w 4d        Date:  04/09/19                 EDD:    01/14/20  U/S Today:     30w 0d                                        EDD:    01/04/20  Best:          28w 4d     Det. By:  LMP  (04/09/19)          EDD:    01/14/20 ---------------------------------------------------------------------- Anatomy  Cranium:               Appears normal         Aortic Arch:            Appears normal  Cavum:                 Appears normal         Ductal Arch:            Not well visualized  Ventricles:            Appears normal         Diaphragm:              Appears normal  Choroid Plexus:        Appears normal         Stomach:                Appears normal,                                                                        left sided  Cerebellum:            Appears normal         Abdomen:                Appears normal  Posterior Fossa:       Appears normal         Abdominal Wall:         Appears nml (cord                                                                        insert, abd wall)  Nuchal Fold:           Not applicable (>36    Cord Vessels:           Appears normal ([redacted]                         wks GA)  vessel cord)  Face:                  Appears normal         Kidneys:                Appear normal                         (orbits and profile)  Lips:                  Appears normal         Bladder:                Appears normal  Heart:                 Appears normal         Spine:                  Appears  normal                         (4CH, axis, and                         situs)  RVOT:                  Appears normal         Upper Extremities:      Appears normal  LVOT:                  Appears normal         Lower Extremities:      Appears normal ---------------------------------------------------------------------- Impression  Single intrauterine pregnancy with measurments consistent  with dates  Normal anatomy seen with good fetal movement and  amniotic fluid  Consultation performed for ulterative colitis please see  consult ---------------------------------------------------------------------- Recommendations  Follow up growth in 4 weeks. ----------------------------------------------------------------------               Sander Nephew, MD Electronically Signed Final Report   10/26/2019 03:44 pm ----------------------------------------------------------------------  ECHOCARDIOGRAM COMPLETE  Result Date: 11/17/2019    ECHOCARDIOGRAM REPORT   Patient Name:   IRAIDA CRAGIN Date of Exam: 11/17/2019 Medical Rec #:  [id number]     Height:       62.0 in Accession #:    [id number]    Weight:       202.0 lb Date of Birth:  [birth year]     BSA:          1.920 m Patient Age:    29 years      BP:           Not listed in chart/Not listed in                                             chart mmHg Patient Gender: F             HR:           117 bpm. Exam Location:  [facility] Procedure: 2D Echo, Cardiac Doppler and Color Doppler Indications:     Shortness if breath  History:         Patient has no prior history of Echocardiogram examinations. No  cardiac history listed in chart.  Sonographer:     Sherrie Sport RDCS (AE) Referring Phys:  [place] Diagnosing Phys: Bartholome Bill MD IMPRESSIONS  1. Left ventricular ejection fraction, by estimation, is 60 to 65%. Left ventricular ejection fraction by PLAX is 60 %. The left ventricle has normal function. The left ventricle has no regional wall motion  abnormalities. Left ventricular diastolic parameters were normal.  2. Right ventricular systolic function is normal. The right ventricular size is normal. There is normal pulmonary artery systolic pressure.  3. The mitral valve is grossly normal. Trivial mitral valve regurgitation.  4. The aortic valve is grossly normal. Aortic valve regurgitation is not visualized. FINDINGS  Left Ventricle: Left ventricular ejection fraction, by estimation, is 60 to 65%. Left ventricular ejection fraction by PLAX is 60 %. The left ventricle has normal function. The left ventricle has no regional wall motion abnormalities. The left ventricular internal cavity size was normal in size. There is borderline left ventricular hypertrophy. Left ventricular diastolic parameters were normal. Right Ventricle: The right ventricular size is normal. No increase in right ventricular wall thickness. Right ventricular systolic function is normal. There is normal pulmonary artery systolic pressure. The tricuspid regurgitant velocity is 2.17 m/s, and  with an assumed right atrial pressure of 10 mmHg, the estimated right ventricular systolic pressure is 02.5 mmHg. Left Atrium: Left atrial size was normal in size. Right Atrium: Right atrial size was normal in size. Pericardium: There is no evidence of pericardial effusion. Mitral Valve: The mitral valve is grossly normal. Trivial mitral valve regurgitation. Tricuspid Valve: The tricuspid valve is grossly normal. Tricuspid valve regurgitation is trivial. Aortic Valve: The aortic valve is grossly normal. Aortic valve regurgitation is not visualized. Aortic valve mean gradient measures 6.0 mmHg. Aortic valve peak gradient measures 12.0 mmHg. Aortic valve area, by VTI measures 2.51 cm. Pulmonic Valve: The pulmonic valve was not well visualized. Pulmonic valve regurgitation is trivial. Aorta: The aortic root is normal in size and structure. IAS/Shunts: The interatrial septum was not assessed.  LEFT  VENTRICLE PLAX 2D LV EF:         Left            Diastology                ventricular     LV e' lateral:   13.20 cm/s                ejection        LV E/e' lateral: 9.5                fraction by     LV e' medial:    7.62 cm/s                PLAX is 60      LV E/e' medial:  16.5                %. LVIDd:         4.36 cm LVIDs:         2.99 cm LV PW:         1.23 cm LV IVS:        0.78 cm LVOT diam:     2.00 cm LV SV:         70 LV SV Index:   36 LVOT Area:     3.14 cm  RIGHT VENTRICLE RV Basal diam:  2.79 cm RV S prime:  15.00 cm/s TAPSE (M-mode): 3.4 cm LEFT ATRIUM           Index       RIGHT ATRIUM           Index LA diam:      3.30 cm 1.72 cm/m  RA Area:     10.20 cm LA Vol (A2C): 71.7 ml 37.35 ml/m RA Volume:   19.80 ml  10.31 ml/m LA Vol (A4C): 15.5 ml 8.07 ml/m  AORTIC VALVE                    PULMONIC VALVE AV Area (Vmax):    2.16 cm     PV Vmax:        0.90 m/s AV Area (Vmean):   2.11 cm     PV Peak grad:   3.2 mmHg AV Area (VTI):     2.51 cm     RVOT Peak grad: 3 mmHg AV Vmax:           173.00 cm/s AV Vmean:          113.000 cm/s AV VTI:            0.278 m AV Peak Grad:      12.0 mmHg AV Mean Grad:      6.0 mmHg LVOT Vmax:         119.00 cm/s LVOT Vmean:        76.000 cm/s LVOT VTI:          0.222 m LVOT/AV VTI ratio: 0.80  AORTA Ao Root diam: 2.30 cm MITRAL VALVE                TRICUSPID VALVE MV Area (PHT): 8.72 cm     TR Peak grad:   18.8 mmHg MV Decel Time: 87 msec      TR Vmax:        217.00 cm/s MV E velocity: 126.00 cm/s MV A velocity: 67.70 cm/s   SHUNTS MV E/A ratio:  1.86         Systemic VTI:  0.22 m                             Systemic Diam: 2.00 cm Bartholome Bill MD Electronically signed by Bartholome Bill MD Signature Date/Time: 11/17/2019/3:16:55 PM    Final    A: IUP at 31.[redacted] weeks gestation 29 yo [id number] 36w4dwith covid-19 pneumonia  S/p 5 days of Remdesivir and Solumedrol without much improvement Recommendation to transfer patient to a tertiary center from pulmonologist and  hospitalist in case of worsening condition  P: Called [facility] and spoke with Dr GRoseanne Kaufman MFM who agrees with transfer. Also spoke with Dr SDenice Paradise who has a bed available on the Covid step down unit if patient can maintain pulse O2 at 95 or better with  FiO2 at 45% or less. Called respiratory therapist who gave patient a high flow nasal canula and the patient was able to maintain sats at 95 or greater on 6L/min or 44% FiO2. CAlled [facility] transfer center and the transfer was approved. They will call back with a bed number.   CDalia Heading CNM

## 2019-11-17 NOTE — Progress Notes (Signed)
Inpatient Diabetes Program Recommendations  AACE/ADA: New Consensus Statement on Inpatient Glycemic Control (2015)  Target Ranges:  Prepandial:   less than 140 mg/dL      Peak postprandial:   less than 180 mg/dL (1-2 hours)      Critically ill patients:  140 - 180 mg/dL   Lab Results  Component Value Date   GLUCAP 159 (H) 11/17/2019    Review of Glycemic Control Results for Stephanie Ayala, Stephanie Ayala (MRN [id number]) as of 11/17/2019 12:59  Ref. Range 11/16/2019 20:59 11/17/2019 09:40 11/17/2019 12:25  Glucose-Capillary Latest Ref Range: 70 - 99 mg/dL 124 (H) 106 (H) 159 (H)   Diabetes history: No hx DM Outpatient Diabetes medications: none Current orders for Inpatient glycemic control: Novolog 0-14 units TID Solumedrol 40 mg BID  Inpatient Diabetes Program Recommendations:    Consider changing to carb modified diet with double protein portions while on steroids and would increase correction to Novolog 0-16 units TID (2 hr post prandial, under the diabetes treatment in pregnancy order set).  @1300  Spoke with Asencion Partridge, RN regarding patient assessment, SOB, reasons for ordering CT, maternal tachycardia, current orders, plan for transfer and patient's comfort. Per RN, symptoms are prominent only with activity to bathroom. Patient comfortable and reports feeling the same. Awaiting results for CT and echo. Encouraged to discuss with CNM of FHR monitoring if concern present for acidosis/hypoxemia, need for ABG and level of care pending results.  @1500 : Noted results of CT. Plan for transfer per CCMD.  Will follow while inpatient.   Thanks, Bronson Curb, MSN, RNC-OB Diabetes Coordinator [phone] (8a-5p)

## 2019-11-17 NOTE — Progress Notes (Signed)
RN assisted patient to bathroom and put patient on portable oxygen monitoring to ambulate to bathroom. Patient saturated well while ambulating to bathroom

## 2019-11-17 NOTE — Progress Notes (Signed)
Patient back from CT. Patient put back on telemetry.

## 2019-11-17 NOTE — Discharge Summary (Signed)
Discharge Summary   Patient ID: Stephanie Ayala [id number] 29 y.o. [birth year]  Admit date: 11/13/2019  Discharge date: 11/17/2019  Principal Diagnoses:  1) Acute hypoxemic respiratory failure due to COVID-19 2) Lower respiratory tract infection due to COVID-19 virus 3) Pneumonia due to COVID-19 virus 4) [redacted] weeks gestation of pregnancy  Secondary Diagnoses:  Ulcerative colitis Iron deficiency anemia Depressive disorder  Procedures performed during the hospitalization:  1) Oxygen supplementation 2) Acute hypoxemic respiratory failure due to COVID-19 treatment, including; Remdesivir, Solumedrol 3) Pneumonia due to [place] virus with possible superimposed bacterial pneumonia: treatment with Azithromycin and ceftriaxone 4) Hospitalist consultation 5) Maternal fetal medicine consultation 6) Pulmonology consultation 7) Chest Xray x 2 8) CT chest pulmonary angiography 9) ECHO 10) VTE prophylaxis with lovenox 11) Insulin administration with sliding scale due to concurrent glucocorticoid administration 12) Antepartum fetal testing  HPI: Stephanie Ayala is a 52 y.o. G57P0010 female with EDC=01/14/2020 at 77w1ddated by a 9 week ultrasound.  Her pregnancy has been complicated by depression (restarted Cymbalta), ulcerative colitis (on Colazal), and obesity (current BMI=36.95 kg/m2). She is A negative and did receive Rhogam 10/18/2019. She had an elevated 1 hour GTT at 28 weeks and an elevated fasting blood sugar on her 3 hr GTT.  She presented to the ER this AM with worsening SOB due to Covid19 infection. Has been symptomatic x 9-10 days and tested positive for Covid on 11/10/2019. Initially had cough and sore throat then began to have fatigue and SOB this past week, worsening over the weekend.  Baby is active. No vaginal bleeding. Not feeling contractions.  Past Medical History:  Diagnosis Date  . Autoimmune disease ([place]   . Colitis   . Depression   . Fibromyalgia     Past Surgical  History:  Procedure Laterality Date  . OVARY SURGERY Bilateral    cyst removal    No Known Allergies  Social History   Tobacco Use  . Smoking status: Former Smoker    Packs/day: 0.00  . Smokeless tobacco: Never Used  Vaping Use  . Vaping Use: Every day  Substance Use Topics  . Alcohol use: Not Currently  . Drug use: No    Family History  Problem Relation Age of Onset  . Diabetes Father   . Hypertension Father   . Diabetes Paternal Uncle   . Heart disease Paternal Uncle   . Alcohol abuse Maternal Grandfather   . Heart disease Paternal [facility]:  29y.o. Pregnant Female (31 week, 5 days gestation admitted with Acute Hypoxic Respiratory Failure secondary to COVID-19 Pneumonia. This is HD #5. Has been on Remdesivir and Solumedrol x 5 days. Was continuing to desat  and having tachycardia to 130s to 140s when out of bed to bathroom. Was switched from nasal canula to mask during the night on hospital day #4. Although inflammatory markers are improving the repeat chest Xray last night did not show any significant improvement. On hospital day #5 she was begun on Rocephin and Azithromycin IV incase there is a bacterial component to her pneumonia. Dr DPatricia Pesa pulmonologist was consulted and he recommended a CT scan to rule out pulmonary embolism and that was negative. She also had a echo done and results are negative. Dr KMortimer Friesrecommended referral to a tertiary center in case of worsening condition needing intensive care and MFM assessment  Discharge Exam: BP 117/69 (BP Location: Right Arm)   Pulse (!) 107   Temp 97.6 F (36.4  C) (Oral)   Resp 18   Ht 5' 2"  (1.575 m)   Wt 91.6 kg   LMP 04/02/2019   SpO2 96%   BMI 36.95 kg/m  Physical Exam Constitutional:      General: She is not in acute distress.    Appearance: Normal appearance. She is well-developed.  HENT:     Head: Normocephalic and atraumatic.  Eyes:     General: No scleral icterus.     Conjunctiva/sclera: Conjunctivae normal.  Cardiovascular:     Rate and Rhythm: Regular rhythm. Tachycardia present.     Heart sounds: No murmur heard.  No friction rub. No gallop.   Pulmonary:     Effort: Pulmonary effort is normal. No respiratory distress.     Breath sounds: No wheezing or rales.     Comments: Airways with mild rhonchi in lower lobes bilaterally Abdominal:     General: Bowel sounds are normal. There is no distension.     Palpations: Abdomen is soft. There is mass (gravid, NT).     Tenderness: There is no abdominal tenderness. There is no guarding or rebound.  Musculoskeletal:        General: Normal range of motion.     Cervical back: Normal range of motion and neck supple.  Neurological:     General: No focal deficit present.     Mental Status: She is alert and oriented to person, place, and time.     Cranial Nerves: No cranial nerve deficit.  Skin:    General: Skin is warm and dry.     Findings: No erythema.  Psychiatric:        Mood and Affect: Mood normal.        Behavior: Behavior normal.        Judgment: Judgment normal.      Condition at Discharge: Stable  Complications affecting treatment: None  Discharge Medications:  Allergies as of 11/17/2019   No Known Allergies     Medication List    STOP taking these medications   acetaminophen 500 MG tablet Commonly known as: TYLENOL   balsalazide 750 MG capsule Commonly known as: COLAZAL   cholecalciferol 25 MCG (1000 UNIT) tablet Commonly known as: VITAMIN D3   DULoxetine 60 MG capsule Commonly known as: CYMBALTA   ferrous sulfate 325 (65 FE) MG tablet   folic acid 1 MG tablet Commonly known as: FOLVITE   guaiFENesin 600 MG 12 hr tablet Commonly known as: MUCINEX   prenatal multivitamin Tabs tablet      Follow-up arrangements: Follow up as recommended after discharge from [facility]  Discharge Disposition: Discharge disposition: [place]  Not Defined  - [place]  Signed: Prentice Docker, MD  11/17/2019 8:20 PM

## 2019-11-17 NOTE — Progress Notes (Signed)
CBG at 1230 was 159. RN to administer insulin according to sliding scale order. RN spoke to CT and they stated that they would send someone to transport patient to [place]. Patient aware of the plan of care. CNM at bedside to assess pt and discuss plan of care.

## 2019-11-17 NOTE — Progress Notes (Addendum)
Pt desating 87-92% while ambulating to the bathroom. Pt received both inhaler doses. Resting saturation has been lingering around 93%. Pt encourage to use incentive spirometer and other deep breathing exercises to increase oxygenation. RN suggested prone position, and pt stated it made her feel like she couldn't breathe. Pt instructed not to sleep directly on her back and flat. Pt alert and orientated X4.

## 2019-11-17 NOTE — Progress Notes (Signed)
Discussed with MD about using a mask versus nasal canula. MD aware of Sat and verbal order to continue increasing oxygen as needed.

## 2019-11-18 DIAGNOSIS — U071 COVID-19: Secondary | ICD-10-CM | POA: Insufficient documentation

## 2019-11-18 DIAGNOSIS — Z8616 Personal history of COVID-19: Secondary | ICD-10-CM | POA: Insufficient documentation

## 2019-11-18 LAB — CULTURE, BLOOD (ROUTINE X 2)
Culture: NO GROWTH
Culture: NO GROWTH

## 2019-11-20 ENCOUNTER — Other Ambulatory Visit: Payer: Self-pay | Admitting: Obstetrics and Gynecology

## 2019-11-20 DIAGNOSIS — K51919 Ulcerative colitis, unspecified with unspecified complications: Secondary | ICD-10-CM

## 2019-11-20 DIAGNOSIS — O99213 Obesity complicating pregnancy, third trimester: Secondary | ICD-10-CM

## 2019-11-20 DIAGNOSIS — O99343 Other mental disorders complicating pregnancy, third trimester: Secondary | ICD-10-CM

## 2019-11-20 DIAGNOSIS — M797 Fibromyalgia: Secondary | ICD-10-CM

## 2019-11-20 DIAGNOSIS — F32A Depression, unspecified: Secondary | ICD-10-CM

## 2019-11-20 LAB — CULTURE, RESPIRATORY W GRAM STAIN: Culture: NORMAL

## 2019-11-23 ENCOUNTER — Ambulatory Visit: Payer: Medicaid Other

## 2019-11-28 ENCOUNTER — Other Ambulatory Visit: Payer: Self-pay | Admitting: Obstetrics and Gynecology

## 2019-11-29 ENCOUNTER — Telehealth: Payer: Self-pay

## 2019-11-29 NOTE — Telephone Encounter (Signed)
Dr. Erskine Squibb calling to give update on pt.  They are discharging her home.  Pt was admitted for covid pneumonia; is now off oxygen and off precautions.  Pt has appt scheduled with PH on the 10th.

## 2019-12-08 ENCOUNTER — Other Ambulatory Visit: Payer: Self-pay

## 2019-12-08 ENCOUNTER — Ambulatory Visit (INDEPENDENT_AMBULATORY_CARE_PROVIDER_SITE_OTHER): Payer: Medicaid Other | Admitting: Obstetrics & Gynecology

## 2019-12-08 ENCOUNTER — Encounter: Payer: Self-pay | Admitting: Obstetrics & Gynecology

## 2019-12-08 VITALS — BP 120/80 | Wt 192.0 lb

## 2019-12-08 DIAGNOSIS — O0990 Supervision of high risk pregnancy, unspecified, unspecified trimester: Secondary | ICD-10-CM

## 2019-12-08 DIAGNOSIS — Z3A34 34 weeks gestation of pregnancy: Secondary | ICD-10-CM

## 2019-12-08 LAB — POCT URINALYSIS DIPSTICK OB
Glucose, UA: NEGATIVE
POC,PROTEIN,UA: NEGATIVE

## 2019-12-08 NOTE — Progress Notes (Signed)
  Subjective  Fetal Movement? yes Contractions? no Leaking Fluid? no Vaginal Bleeding? no Recent Covid tx, pneumonia too, better now Objective  BP 120/80   Wt 192 lb (87.1 kg)   LMP 04/02/2019   BMI 35.12 kg/m  General: NAD Pumonary: no increased work of breathing Abdomen: gravid, non-tender Extremities: no edema Psychiatric: mood appropriate, affect full  Assessment  29 y.o. G2P0010 at 28w5dby  01/14/2020, by Ultrasound presenting for routine prenatal visit  Plan   Problem List Items Addressed This Visit      Other   Supervision of high risk pregnancy, antepartum   Relevant Orders   [place] Follow Up    Other Visit Diagnoses    [redacted] weeks gestation of pregnancy    -  Primary      pregnancy2  Problems (from 04/02/19 to present)    Problem Noted Resolved   Supervision of high risk pregnancy, antepartum 05/26/2019 by HGae Dry MD No   Overview Addendum 10/18/2019 11:41 AM by SHomero Fellers MD    Clinic [facility] Prenatal Labs  Dating By [email or web address] wks Blood type: A/Negative/-- (03/17 1511)   Genetic Screen NIPS:declines Antibody:Negative (03/17 1511)  Anatomic UKore[place]omplete Rubella: <0.90 (03/17 1511) Varicella: Non-Immune  GTT Early:104      Third trimester:  RPR: Non Reactive (03/17 1511)   Rhogam  10/18/2019 HBsAg: Negative (03/17 1511)   TDaP vaccine                       Flu Shot: HIV: Non Reactive (03/17 1511)   Baby Food                                GBS:   Contraception  Pap:06/14/2019  CBB  No   CS/VBAC N/A   Support Person DAleene Davidson husband    Major depression:  Cymbalta restarted 10/18/2019- urgent Psychiatry referral made. Given list of resources.  UC: patient encouraged to restart medication and follow up with [facility].  Fibromyalgia: Patient encouraged to [facility].        Previous Version     [place]  PNV, [place] PTL precautiuons  PBarnett Applebaum MD, FLoura PardonOb/Gyn, CW[facility]Group 12/08/2019  3:15  PM

## 2019-12-08 NOTE — Addendum Note (Signed)
Addended by: Quintella Baton D on: 12/08/2019 03:23 PM   Modules accepted: Orders

## 2019-12-11 ENCOUNTER — Telehealth: Payer: Self-pay

## 2019-12-11 NOTE — Telephone Encounter (Signed)
Pt called after hour nurse 12/10/19 4:31am; 35wks; has freq urge to use the bathroom, has vaginal and rectal pressure, labored breathing, vag d/c.  After hour nurse adv pt to see PCP within 24hrs. [place] p 2pm

## 2019-12-13 ENCOUNTER — Ambulatory Visit (INDEPENDENT_AMBULATORY_CARE_PROVIDER_SITE_OTHER): Payer: Medicaid Other | Admitting: Obstetrics & Gynecology

## 2019-12-13 ENCOUNTER — Ambulatory Visit (INDEPENDENT_AMBULATORY_CARE_PROVIDER_SITE_OTHER): Payer: Medicaid Other

## 2019-12-13 ENCOUNTER — Encounter: Payer: Self-pay | Admitting: Obstetrics & Gynecology

## 2019-12-13 ENCOUNTER — Other Ambulatory Visit: Payer: Self-pay

## 2019-12-13 VITALS — BP 120/80 | Wt 194.0 lb

## 2019-12-13 DIAGNOSIS — O0990 Supervision of high risk pregnancy, unspecified, unspecified trimester: Secondary | ICD-10-CM | POA: Diagnosis not present

## 2019-12-13 DIAGNOSIS — Z3A36 36 weeks gestation of pregnancy: Secondary | ICD-10-CM

## 2019-12-13 DIAGNOSIS — Z3A35 35 weeks gestation of pregnancy: Secondary | ICD-10-CM | POA: Diagnosis not present

## 2019-12-13 DIAGNOSIS — O0993 Supervision of high risk pregnancy, unspecified, third trimester: Secondary | ICD-10-CM

## 2019-12-13 LAB — POCT URINALYSIS DIPSTICK OB: Glucose, UA: NEGATIVE

## 2019-12-13 NOTE — Patient Instructions (Signed)
Braxton Hicks Contractions Contractions of the uterus can occur throughout pregnancy, but they are not always a sign that you are in labor. You may have practice contractions called Braxton Hicks contractions. These false labor contractions are sometimes confused with true labor. What are [place] contractions? Braxton Hicks contractions are tightening movements that occur in the muscles of the uterus before labor. Unlike true labor contractions, these contractions do not result in opening (dilation) and thinning of the cervix. Toward the end of pregnancy (32-34 weeks), Braxton Hicks contractions can happen more often and may become stronger. These contractions are sometimes difficult to tell apart from true labor because they can be very uncomfortable. You should not feel embarrassed if you go to the hospital with false labor. Sometimes, the only way to tell if you are in true labor is for your health care provider to look for changes in the cervix. The health care provider will do a physical exam and may monitor your contractions. If you are not in true labor, the exam should show that your cervix is not dilating and your water has not broken. If there are no other health problems associated with your pregnancy, it is completely safe for you to be sent home with false labor. You may continue to have Braxton Hicks contractions until you go into true labor. How to tell the difference between true labor and false labor True labor  Contractions last 30-70 seconds.  Contractions become very regular.  Discomfort is usually felt in the top of the uterus, and it spreads to the lower abdomen and low back.  Contractions do not go away with walking.  Contractions usually become more intense and increase in frequency.  The cervix dilates and gets thinner. False labor  Contractions are usually shorter and not as strong as true labor contractions.  Contractions are usually irregular.  Contractions  are often felt in the front of the lower abdomen and in the groin.  Contractions may go away when you walk around or change positions while lying down.  Contractions get weaker and are shorter-lasting as time goes on.  The cervix usually does not dilate or become thin. Follow these instructions at home:   Take over-the-counter and prescription medicines only as told by your health care provider.  Keep up with your usual exercises and follow other instructions from your health care provider.  Eat and drink lightly if you think you are going into labor.  If Braxton Hicks contractions are making you uncomfortable: ? Change your position from lying down or resting to walking, or change from walking to resting. ? Sit and rest in a tub of warm water. ? Drink enough fluid to keep your urine pale yellow. Dehydration may cause these contractions. ? Do slow and deep breathing several times an hour.  Keep all follow-up prenatal visits as told by your health care provider. This is important. Contact a health care provider if:  You have a fever.  You have continuous pain in your abdomen. Get help right away if:  Your contractions become stronger, more regular, and closer together.  You have fluid leaking or gushing from your vagina.  You pass blood-tinged mucus (bloody show).  You have bleeding from your vagina.  You have low back pain that you never had before.  You feel your baby's head pushing down and causing pelvic pressure.  Your baby is not moving inside you as much as it used to. Summary  Contractions that occur before labor are  called Braxton Hicks contractions, false labor, or practice contractions.  Braxton Hicks contractions are usually shorter, weaker, farther apart, and less regular than true labor contractions. True labor contractions usually become progressively stronger and regular, and they become more frequent.  Manage discomfort from [facility] contractions  by changing position, resting in a warm bath, drinking plenty of water, or practicing deep breathing. This information is not intended to replace advice given to you by your health care provider. Make sure you discuss any questions you have with your health care provider. Document Revised: 02/26/2017 Document Reviewed: 07/30/2016 Elsevier Patient Education  [place].

## 2019-12-13 NOTE — Addendum Note (Signed)
Addended by: Quintella Baton D on: 12/13/2019 02:22 PM   Modules accepted: Orders

## 2019-12-13 NOTE — Progress Notes (Signed)
  Subjective  Fetal Movement? yes Contractions? no Leaking Fluid? no Vaginal Bleeding? no  Objective  BP 120/80   Wt 194 lb (88 kg)   LMP 04/02/2019   BMI 35.48 kg/m  General: NAD Pumonary: no increased work of breathing Abdomen: gravid, non-tender Extremities: no edema Psychiatric: mood appropriate, affect full  Review of ULTRASOUND.    I have personally reviewed images and report of recent ultrasound done at [facility]    Plan of management to be discussed with patient.  Assessment  29 y.o. G2P0010 at 49w3dby  01/14/2020, by Ultrasound presenting for routine prenatal visit  Plan   Problem List Items Addressed This Visit      Other   Supervision of high risk pregnancy, antepartum    Other Visit Diagnoses    [redacted] weeks gestation of pregnancy    -  Primary      pregnancy2  Problems (from 04/02/19 to present)    Problem Noted Resolved   Supervision of high risk pregnancy, antepartum 05/26/2019 by HGae Dry MD No   Overview Addendum 10/18/2019 11:41 AM by SHomero Fellers M[place]blePrenatal Labs  Dating By [email or web address] wks Blood type: A/Negative/-- (03/17 1511)   Genetic Screen NIPS:declines Antibody:Negative (03/17 1511)  Anatomic UKoreacomplete Rubella: <0.90 (03/17 1511) Varicella: Non-Immune  GTT Early:104      Third trimester:  RPR: Non Reactive (03/17 1511)   Rhogam  10/18/2019 HBsAg: Negative (03/17 1511)   TDaP vaccine                       Flu Shot: HIV: Non Reactive (03/17 1511)   Baby Food                                GBS:   Contraception  Pap:06/14/2019  CBB  No   CS/VBAC N/A   Support Person DAleene Davidson husband    Major depression:  Cymbalta restarted 10/18/2019- urgent Psychiatry referral made. Given list of resources.  UC: patient encouraged to restart medication and follow up with [facility].  Fibromyalgia: Patient encouraged to follow [facility] with [facility].        Previous Version    PNV, [place] PTL precautions GBS nv   PBarnett Applebaum  MD, FLoura PardonOb/Gyn, CW[facility]Group 12/13/2019  2:18 PM

## 2019-12-18 ENCOUNTER — Encounter: Payer: Self-pay | Admitting: Obstetrics & Gynecology

## 2019-12-18 ENCOUNTER — Telehealth: Payer: Self-pay

## 2019-12-18 ENCOUNTER — Ambulatory Visit (INDEPENDENT_AMBULATORY_CARE_PROVIDER_SITE_OTHER): Payer: Medicaid Other | Admitting: Obstetrics & Gynecology

## 2019-12-18 ENCOUNTER — Other Ambulatory Visit: Payer: Self-pay

## 2019-12-18 VITALS — BP 100/68 | Ht 62.0 in | Wt 198.4 lb

## 2019-12-18 DIAGNOSIS — Z3A36 36 weeks gestation of pregnancy: Secondary | ICD-10-CM

## 2019-12-18 DIAGNOSIS — Z3685 Encounter for antenatal screening for Streptococcus B: Secondary | ICD-10-CM

## 2019-12-18 DIAGNOSIS — O0993 Supervision of high risk pregnancy, unspecified, third trimester: Secondary | ICD-10-CM

## 2019-12-18 NOTE — Progress Notes (Signed)
°  Subjective  Fetal Movement? Yes, but decreased today, still some movements at time Contractions? No, but has some lower abdominal pains over the weekend Leaking Fluid? no Vaginal Bleeding? no  Objective  BP 100/68    Ht 5' 2"  (1.575 m)    Wt 198 lb 6.4 oz (90 kg)    LMP 04/02/2019    BMI 36.29 kg/m  General: NAD Pumonary: no increased work of breathing Abdomen: gravid, non-tender Extremities: no edema Psychiatric: mood appropriate, affect full Cx closed, thick, high FHT 120s [place] confirmed FHTs  Assessment  29 y.o. G2P0010 at 49w1dby  01/14/2020, by Ultrasound presenting for routine prenatal visit  Plan   Problem List Items Addressed This Visit    None    Visit Diagnoses    [redacted] weeks gestation of pregnancy    -  Primary   Antenatal screening for streptococcus B       Relevant Orders   Culture, beta strep (group b only)   Supervision of high risk pregnancy in third trimester          pregnancy2  Problems (from 04/02/19 to present)    Problem Noted Resolved   Supervision of high risk pregnancy, antepartum 05/26/2019 by HGae Dry MD No   Overview Addendum 10/18/2019 11:41 AM by SHomero Fellers MD    Clinic [facility] Prenatal Labs  Dating By [email or web address] wks Blood type: A/Negative/-- (03/17 1511)   Genetic Screen NIPS:declines Antibody:Negative (03/17 1511)  Anatomic UKoreacomplete Rubella: <0.90 (03/17 1511) Varicella: Non-Immune  GTT Early:104      Third trimester:  RPR: Non Reactive (03/17 1511)   Rhogam  10/18/2019 HBsAg: Negative (03/17 1511)   TDaP vaccine                       Flu Shot: HIV: Non Reactive (03/17 1511)   Baby Food                                GBS:   Contraception  Pap:06/14/2019  CBB  No   CS/VBAC N/A   Support Person DAleene Davidson husband    Major depression:  Cymbalta restarted 10/18/2019- urgent Psychiatry referral made. Given list of resources.  UC: patient encouraged to restart medication and follow up with [facility].  Fibromyalgia: Patient  encouraged to follow up with [facility].        Previous Version     PNV,FMC, GBS, monitor for labor   PBarnett Applebaum MD, FLoura PardonOb/Gyn, CE[facility]Group 12/18/2019  4:57 PM

## 2019-12-18 NOTE — Telephone Encounter (Signed)
Pt having DFM, tightness in stomach and cramping, she has barely felt any movement today.

## 2019-12-19 ENCOUNTER — Encounter: Payer: Medicaid Other | Admitting: Obstetrics and Gynecology

## 2019-12-21 ENCOUNTER — Encounter: Payer: Self-pay | Admitting: Psychiatry

## 2019-12-21 ENCOUNTER — Telehealth (INDEPENDENT_AMBULATORY_CARE_PROVIDER_SITE_OTHER): Payer: 59 | Admitting: Psychiatry

## 2019-12-21 ENCOUNTER — Other Ambulatory Visit: Payer: Self-pay

## 2019-12-21 DIAGNOSIS — F330 Major depressive disorder, recurrent, mild: Secondary | ICD-10-CM | POA: Diagnosis not present

## 2019-12-21 DIAGNOSIS — F411 Generalized anxiety disorder: Secondary | ICD-10-CM | POA: Diagnosis not present

## 2019-12-21 MED ORDER — DULOXETINE HCL 20 MG PO CPEP: 20.0000 mg | ORAL_CAPSULE | Freq: Every day | ORAL | 0 refills | Status: DC
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
## 2019-12-21 NOTE — Patient Instructions (Signed)
Duloxetine delayed-release capsules What is this medicine? DULOXETINE (doo LOX e teen) is used to treat depression, anxiety, and different types of chronic pain. This medicine may be used for other purposes; ask your health care provider or pharmacist if you have questions. COMMON BRAND NAME(S): Cymbalta, Creig Hines, Irenka What should I tell my health care provider before I take this medicine? They need to know if you have any of these conditions:  bipolar disorder  glaucoma  high blood pressure  kidney disease  liver disease  seizures  suicidal thoughts, plans or attempt; a previous suicide attempt by you or a family member  take medicines that treat or prevent blood clots  taken medicines called MAOIs like Carbex, Eldepryl, Marplan, Nardil, and Parnate within 14 days  trouble passing urine  an unusual reaction to duloxetine, other medicines, foods, dyes, or preservatives  pregnant or trying to get pregnant  breast-feeding How should I use this medicine? Take this medicine by mouth with a glass of water. Follow the directions on the prescription label. Do not crush, cut or chew some capsules of this medicine. Some capsules may be opened and sprinkled on applesauce. Check with your doctor or pharmacist if you are not sure. You can take this medicine with or without food. Take your medicine at regular intervals. Do not take your medicine more often than directed. Do not stop taking this medicine suddenly except upon the advice of your doctor. Stopping this medicine too quickly may cause serious side effects or your condition may worsen. A special MedGuide will be given to you by the pharmacist with each prescription and refill. Be sure to read this information carefully each time. Talk to your pediatrician regarding the use of this medicine in children. While this drug may be prescribed for children as young as 98 years of age for selected conditions, precautions do  apply. Overdosage: If you think you have taken too much of this medicine contact a poison control center or emergency room at once. NOTE: This medicine is only for you. Do not share this medicine with others. What if I miss a dose? If you miss a dose, take it as soon as you can. If it is almost time for your next dose, take only that dose. Do not take double or extra doses. What may interact with this medicine? Do not take this medicine with any of the following medications:  desvenlafaxine  levomilnacipran  linezolid  MAOIs like Carbex, Eldepryl, Marplan, Nardil, and Parnate  methylene blue (injected into a vein)  milnacipran  thioridazine  venlafaxine This medicine may also interact with the following medications:  alcohol  amphetamines  aspirin and aspirin-like medicines  certain antibiotics like ciprofloxacin and enoxacin  certain medicines for blood pressure, heart disease, irregular heart beat  certain medicines for depression, anxiety, or psychotic disturbances  certain medicines for migraine headache like almotriptan, eletriptan, frovatriptan, naratriptan, rizatriptan, sumatriptan, zolmitriptan  certain medicines that treat or prevent blood clots like warfarin, enoxaparin, and dalteparin  cimetidine  fentanyl  lithium  NSAIDS, medicines for pain and inflammation, like ibuprofen or naproxen  phentermine  procarbazine  rasagiline  sibutramine  St. John's wort  theophylline  tramadol  tryptophan This list may not describe all possible interactions. Give your health care provider a list of all the medicines, herbs, non-prescription drugs, or dietary supplements you use. Also tell them if you smoke, drink alcohol, or use illegal drugs. Some items may interact with your medicine. What should I watch for while  using this medicine? Tell your doctor if your symptoms do not get better or if they get worse. Visit your doctor or healthcare provider for  regular checks on your progress. Because it may take several weeks to see the full effects of this medicine, it is important to continue your treatment as prescribed by your doctor. This medicine may cause serious skin reactions. They can happen weeks to months after starting the medicine. Contact your healthcare provider right away if you notice fevers or flu-like symptoms with a rash. The rash may be red or purple and then turn into blisters or peeling of the skin. Or, you might notice a red rash with swelling of the face, lips, or lymph nodes in your neck or under your arms. Patients and their families should watch out for new or worsening thoughts of suicide or depression. Also watch out for sudden changes in feelings such as feeling anxious, agitated, panicky, irritable, hostile, aggressive, impulsive, severely restless, overly excited and hyperactive, or not being able to sleep. If this happens, especially at the beginning of treatment or after a change in dose, call your healthcare provider. You may get drowsy or dizzy. Do not drive, use machinery, or do anything that needs mental alertness until you know how this medicine affects you. Do not stand or sit up quickly, especially if you are an older patient. This reduces the risk of dizzy or fainting spells. Alcohol may interfere with the effect of this medicine. Avoid alcoholic drinks. This medicine can cause an increase in blood pressure. This medicine can also cause a sudden drop in your blood pressure, which may make you feel faint and increase the chance of a fall. These effects are most common when you first start the medicine or when the dose is increased, or during use of other medicines that can cause a sudden drop in blood pressure. Check with your doctor for instructions on monitoring your blood pressure while taking this medicine. Your mouth may get dry. Chewing sugarless gum or sucking hard candy, and drinking plenty of water, may help.  Contact your doctor if the problem does not go away or is severe. What side effects may I notice from receiving this medicine? Side effects that you should report to your doctor or health care professional as soon as possible:  allergic reactions like skin rash, itching or hives, swelling of the face, lips, or tongue  anxious  breathing problems  confusion  changes in vision  chest pain  confusion  elevated mood, decreased need for sleep, racing thoughts, impulsive behavior  eye pain  fast, irregular heartbeat  feeling faint or lightheaded, falls  feeling agitated, angry, or irritable  hallucination, loss of contact with reality  high blood pressure  loss of balance or coordination  palpitations  redness, blistering, peeling or loosening of the skin, including inside the mouth  restlessness, pacing, inability to keep still  seizures  stiff muscles  suicidal thoughts or other mood changes  trouble passing urine or change in the amount of urine  trouble sleeping  unusual bleeding or bruising  unusually weak or tired  vomiting  yellowing of the eyes or skin Side effects that usually do not require medical attention (report to your doctor or health care professional if they continue or are bothersome):  change in sex drive or performance  change in appetite or weight  constipation  dizziness  dry mouth  headache  increased sweating  nausea  tired This list may not describe  all possible side effects. Call your doctor for medical advice about side effects. You may report side effects to FDA at 1-800-FDA-1088. Where should I keep my medicine? Keep out of the reach of children. Store at room temperature between 15 and 30 degrees C (59 to 86 degrees F). Throw away any unused medicine after the expiration date. NOTE: This sheet is a summary. It may not cover all possible information. If you have questions about this medicine, talk to your doctor,  pharmacist, or health care provider.  2020 Elsevier/Gold Standard (2018-06-16 13:47:50)

## 2019-12-23 LAB — CULTURE, BETA STREP (GROUP B ONLY): Strep Gp B Culture: NEGATIVE

## 2019-12-24 ENCOUNTER — Inpatient Hospital Stay: Payer: 59 | Admitting: Anesthesiology

## 2019-12-24 ENCOUNTER — Encounter: Payer: Self-pay | Admitting: Obstetrics and Gynecology

## 2019-12-24 ENCOUNTER — Inpatient Hospital Stay
Admission: EM | Admit: 2019-12-24 | Discharge: 2019-12-27 | DRG: 806 | Disposition: A | Discharge status: Home / Self Care | Payer: 59 | Attending: Obstetrics and Gynecology | Admitting: Obstetrics and Gynecology

## 2019-12-24 ENCOUNTER — Other Ambulatory Visit: Payer: Self-pay

## 2019-12-24 DIAGNOSIS — Z8616 Personal history of COVID-19: Secondary | ICD-10-CM | POA: Diagnosis not present

## 2019-12-24 DIAGNOSIS — O26893 Other specified pregnancy related conditions, third trimester: Secondary | ICD-10-CM | POA: Diagnosis present

## 2019-12-24 DIAGNOSIS — E669 Obesity, unspecified: Secondary | ICD-10-CM | POA: Diagnosis present

## 2019-12-24 DIAGNOSIS — Z87891 Personal history of nicotine dependence: Secondary | ICD-10-CM

## 2019-12-24 DIAGNOSIS — O0990 Supervision of high risk pregnancy, unspecified, unspecified trimester: Secondary | ICD-10-CM

## 2019-12-24 DIAGNOSIS — Z3A37 37 weeks gestation of pregnancy: Secondary | ICD-10-CM | POA: Diagnosis not present

## 2019-12-24 DIAGNOSIS — O99344 Other mental disorders complicating childbirth: Secondary | ICD-10-CM | POA: Diagnosis present

## 2019-12-24 DIAGNOSIS — F32A Depression, unspecified: Secondary | ICD-10-CM | POA: Diagnosis present

## 2019-12-24 DIAGNOSIS — O99214 Obesity complicating childbirth: Secondary | ICD-10-CM | POA: Diagnosis present

## 2019-12-24 DIAGNOSIS — O4290 Premature rupture of membranes, unspecified as to length of time between rupture and onset of labor, unspecified weeks of gestation: Secondary | ICD-10-CM | POA: Diagnosis present

## 2019-12-24 DIAGNOSIS — Z23 Encounter for immunization: Secondary | ICD-10-CM

## 2019-12-24 DIAGNOSIS — Z91199 Patient's noncompliance with other medical treatment and regimen due to unspecified reason: Secondary | ICD-10-CM

## 2019-12-24 DIAGNOSIS — F329 Major depressive disorder, single episode, unspecified: Secondary | ICD-10-CM | POA: Diagnosis present

## 2019-12-24 DIAGNOSIS — O4292 Full-term premature rupture of membranes, unspecified as to length of time between rupture and onset of labor: Principal | ICD-10-CM | POA: Diagnosis present

## 2019-12-24 DIAGNOSIS — O4202 Full-term premature rupture of membranes, onset of labor within 24 hours of rupture: Secondary | ICD-10-CM | POA: Diagnosis not present

## 2019-12-24 LAB — RAPID HIV SCREEN (HIV 1/2 AB+AG)
HIV 1/2 Antibodies: NONREACTIVE
HIV-1 P24 Antigen - HIV24: NONREACTIVE

## 2019-12-24 LAB — TYPE AND SCREEN
ABO/RH(D): A NEG
Antibody Screen: NEGATIVE

## 2019-12-24 LAB — RESPIRATORY PANEL BY RT PCR (FLU A&B, COVID)
Influenza A by PCR: NEGATIVE
Influenza B by PCR: NEGATIVE
SARS Coronavirus 2 by RT PCR: NEGATIVE

## 2019-12-24 LAB — RUPTURE OF MEMBRANE (ROM)PLUS: Rom Plus: POSITIVE

## 2019-12-24 LAB — CBC
HCT: 31.6 % — ABNORMAL LOW (ref 36.0–46.0)
Hemoglobin: 10.5 g/dL — ABNORMAL LOW (ref 12.0–15.0)
MCH: 28.2 pg (ref 26.0–34.0)
MCHC: 33.2 g/dL (ref 30.0–36.0)
MCV: 84.9 fL (ref 80.0–100.0)
Platelets: 322 10*3/uL (ref 150–400)
RBC: 3.72 MIL/uL — ABNORMAL LOW (ref 3.87–5.11)
RDW: 15.2 % (ref 11.5–15.5)
WBC: 14.3 10*3/uL — ABNORMAL HIGH (ref 4.0–10.5)
nRBC: 0 % (ref 0.0–0.2)

## 2019-12-24 LAB — ABO/RH: ABO/RH(D): A NEG

## 2019-12-24 MED ORDER — EPHEDRINE 5 MG/ML INJ: 10.0000 mg | INTRAVENOUS | Status: DC | PRN

## 2019-12-24 MED ORDER — ONDANSETRON HCL 4 MG/2ML IJ SOLN: 4.0000 mg | Freq: Four times a day (QID) | INTRAMUSCULAR | Status: DC | PRN

## 2019-12-24 MED ORDER — OXYTOCIN-SODIUM CHLORIDE 30-0.9 UT/500ML-% IV SOLN
1.0000 m[IU]/min | INTRAVENOUS | Status: DC
  Administered 2019-12-25: 2 m[IU]/min via INTRAVENOUS

## 2019-12-24 MED ORDER — FENTANYL 2.5 MCG/ML W/ROPIVACAINE 0.15% IN NS 100 ML EPIDURAL (ARMC)
12.0000 mL/h | EPIDURAL | Status: DC
  Administered 2019-12-24 – 2019-12-25 (×2): 12 mL/h via EPIDURAL
  Filled 2019-12-24: qty 100

## 2019-12-24 MED ORDER — OXYTOCIN-SODIUM CHLORIDE 30-0.9 UT/500ML-% IV SOLN
INTRAVENOUS | Status: AC
  Filled 2019-12-24: qty 1000

## 2019-12-24 MED ORDER — MISOPROSTOL 200 MCG PO TABS
ORAL_TABLET | ORAL | Status: AC
  Filled 2019-12-24: qty 4

## 2019-12-24 MED ORDER — LACTATED RINGERS IV SOLN: INTRAVENOUS | Status: DC

## 2019-12-24 MED ORDER — LACTATED RINGERS IV SOLN
500.0000 mL | INTRAVENOUS | Status: DC | PRN
  Administered 2019-12-24: 500 mL via INTRAVENOUS

## 2019-12-24 MED ORDER — LIDOCAINE HCL (PF) 1 % IJ SOLN: 30.0000 mL | INTRAMUSCULAR | Status: DC | PRN

## 2019-12-24 MED ORDER — OXYTOCIN BOLUS FROM INFUSION: 333.0000 mL | Freq: Once | INTRAVENOUS | Status: DC

## 2019-12-24 MED ORDER — DULOXETINE HCL 60 MG PO CPEP
60.0000 mg | ORAL_CAPSULE | Freq: Every day | ORAL | Status: DC
  Administered 2019-12-25 – 2019-12-26 (×2): 60 mg via ORAL
  Filled 2019-12-24 (×6): qty 1

## 2019-12-24 MED ORDER — MISOPROSTOL 25 MCG QUARTER TABLET
25.0000 ug | ORAL_TABLET | ORAL | Status: DC | PRN
  Administered 2019-12-24: 25 ug via VAGINAL
  Filled 2019-12-24: qty 1

## 2019-12-24 MED ORDER — LIDOCAINE HCL (PF) 1 % IJ SOLN
INTRAMUSCULAR | Status: DC | PRN
  Administered 2019-12-24: 2 mL

## 2019-12-24 MED ORDER — ACETAMINOPHEN 325 MG PO TABS
650.0000 mg | ORAL_TABLET | ORAL | Status: DC | PRN
  Administered 2019-12-24: 650 mg via ORAL
  Filled 2019-12-24: qty 2

## 2019-12-24 MED ORDER — INFLUENZA VAC SPLIT QUAD 0.5 ML IM SUSY
0.5000 mL | PREFILLED_SYRINGE | INTRAMUSCULAR | Status: DC
  Filled 2019-12-24: qty 0.5

## 2019-12-24 MED ORDER — PHENYLEPHRINE 40 MCG/ML (10ML) SYRINGE FOR IV PUSH (FOR BLOOD PRESSURE SUPPORT): 80.0000 ug | PREFILLED_SYRINGE | INTRAVENOUS | Status: DC | PRN

## 2019-12-24 MED ORDER — MISOPROSTOL 25 MCG QUARTER TABLET
25.0000 ug | ORAL_TABLET | ORAL | Status: DC | PRN
  Administered 2019-12-24 (×2): 25 ug via ORAL
  Filled 2019-12-24 (×2): qty 1

## 2019-12-24 MED ORDER — LIDOCAINE HCL (PF) 1 % IJ SOLN
INTRAMUSCULAR | Status: AC
  Filled 2019-12-24: qty 30

## 2019-12-24 MED ORDER — LACTATED RINGERS IV SOLN: 500.0000 mL | Freq: Once | INTRAVENOUS | Status: AC

## 2019-12-24 MED ORDER — OXYTOCIN 10 UNIT/ML IJ SOLN
INTRAMUSCULAR | Status: AC
  Filled 2019-12-24: qty 2

## 2019-12-24 MED ORDER — DIPHENHYDRAMINE HCL 50 MG/ML IJ SOLN: 12.5000 mg | INTRAMUSCULAR | Status: DC | PRN

## 2019-12-24 MED ORDER — OXYTOCIN-SODIUM CHLORIDE 30-0.9 UT/500ML-% IV SOLN
2.5000 [IU]/h | INTRAVENOUS | Status: DC
  Administered 2019-12-25: 2.5 [IU]/h via INTRAVENOUS

## 2019-12-24 MED ORDER — FENTANYL 2.5 MCG/ML W/ROPIVACAINE 0.15% IN NS 100 ML EPIDURAL (ARMC)
EPIDURAL | Status: AC
  Filled 2019-12-24: qty 100

## 2019-12-24 MED ORDER — SOD CITRATE-CITRIC ACID 500-334 MG/5ML PO SOLN: 30.0000 mL | ORAL | Status: DC | PRN

## 2019-12-24 MED ORDER — AMMONIA AROMATIC IN INHA
RESPIRATORY_TRACT | Status: AC
  Filled 2019-12-24: qty 10

## 2019-12-24 MED ORDER — SODIUM CHLORIDE 0.9 % IV SOLN
INTRAVENOUS | Status: DC | PRN
  Administered 2019-12-24 (×3): 5 mL via EPIDURAL

## 2019-12-24 MED ORDER — BALSALAZIDE DISODIUM 750 MG PO CAPS: 2250.0000 mg | ORAL_CAPSULE | Freq: Three times a day (TID) | ORAL | Status: DC

## 2019-12-24 MED ORDER — TERBUTALINE SULFATE 1 MG/ML IJ SOLN: 0.2500 mg | Freq: Once | INTRAMUSCULAR | Status: DC | PRN

## 2019-12-24 MED ORDER — BUTORPHANOL TARTRATE 1 MG/ML IJ SOLN: 2.0000 mg | INTRAMUSCULAR | Status: DC | PRN

## 2019-12-24 MED ORDER — BALSALAZIDE DISODIUM 750 MG PO CAPS
2250.0000 mg | ORAL_CAPSULE | Freq: Every day | ORAL | Status: DC
  Administered 2019-12-25 – 2019-12-26 (×2): 2250 mg via ORAL
  Filled 2019-12-24 (×5): qty 3

## 2019-12-24 NOTE — OB Triage Note (Signed)
Pt thinks her water broke at 0600 this am. Stephanie Ayala, Stephanie Ayala

## 2019-12-24 NOTE — Progress Notes (Signed)
  Labor Progress Note   29 y.o. year old G2P0010 at 29w0dweeks gestation admitted for premature rupture of membranes  Subjective:  Patient is painfully contracting. She has had some continued leakage of fluid. She desires an epidural   Objective:   Vitals:   12/24/19 1903 12/24/19 2138  BP: 119/81   Pulse: 100   Resp: 18   Temp: 98.6 F (37 C) 98.7 F (37.1 C)    Abd: non-tender Extr: no edema  SVE: 1/80/-3  EFM: FHR: 120 bpm, variability: moderate,  accelerations: 15x15,  decelerations:  Absent Toco:  Every 1-2 minutes   Assessment & Plan:  29 y.o. year old G2P0010 at 293w0deeks gestation admitted for premature rupture of membranes Pregnancy and Labor/Delivery Management  1. Pain management:  2. FWB: FHT category 1 3. ID: GBS Negative 4. Labor management: Foley bulb placed without issue and inflated with 30 cc of fluid.  Patient desires epidural, anesthesia called. Will continue induction with pitocin as needed for maintenance of adequate contraction pattern.   All discussed with patient, see orders  ChAdrian ProwsD [place] 12/24/2019 11:14 PM

## 2019-12-24 NOTE — H&P (Addendum)
Stephanie Ayala is an 29 y.o. female.   Chief Complaint: Leakage of fluid HPI: Patient presented to labor and delivery with complaints of leakage of fluid which began at 6 am. She also passed her mucus plug. She reports normal fetal movement. She denies bleeding. Mild lower abdomen cramping.   Her pregnancy has been complicated by Covid hospitalization, Ulcerative colitis, depression, maternal obesity,   pregnancy2  Problems (from 04/02/19 to present)    Problem Noted Resolved   Supervision of high risk pregnancy, antepartum 05/26/2019 by Gae Dry, MD No   Overview Addendum 10/18/2019 11:41 AM by Homero Fellers, [place] Prenatal Labs  Dating By [place] 9 wks Blood type: A/Negative/-- (03/17 1511)   Genetic Screen NIPS:declines Antibody:Negative (03/17 1511)  Anatomic [place] complete Rubella: <0.90 (03/17 1511) Varicella: Non-Immune  GTT Early:104      Third trimester:  RPR: Non Reactive (03/17 1511)   Rhogam  10/18/2019 HBsAg: Negative (03/17 1511)   TDaP vaccine none                       Flu Shot:none HIV: Non Reactive (03/17 1511)   Baby Food                                GBS: negative  Contraception  Pap:06/14/2019  CBB  No   CS/VBAC N/A   Support Person Aleene Davidson- husband    Major depression:  Cymbalta restarted 10/18/2019- urgent Psychiatry referral made. Given list of resources.  UC: patient encouraged to restart medication and follow up with [facility].  Fibromyalgia: Patient encouraged to follow up with [facility].        Previous Version       Past Medical History:  Diagnosis Date  . Autoimmune disease ([place])   . Colitis   . Depression   . Fibromyalgia     Past Surgical History:  Procedure Laterality Date  . OVARY SURGERY Bilateral    cyst removal    Family History  Problem Relation Age of Onset  . Anxiety disorder Mother   . Diabetes Father   . Hypertension Father   . Diabetes Paternal Uncle   . Heart disease Paternal Uncle   . Alcohol  abuse Maternal Grandfather   . Heart disease Paternal Grandfather    Social History:  reports that she has quit smoking. She smoked 0.00 packs per day. She has never used smokeless tobacco. She reports previous alcohol use. She reports that she does not use drugs.  Allergies: No Known Allergies  Medications Prior to Admission  Medication Sig Dispense Refill  . balsalazide (COLAZAL) 750 MG capsule Take 2,250 mg by mouth 3 (three) times daily.    . Cholecalciferol 125 MCG (5000 UT) capsule Take by mouth.    . DULoxetine (CYMBALTA) 60 MG capsule Take 60 mg by mouth daily.     . Prenatal 28-0.8 MG TABS Take 1 tablet by mouth daily.    . DULoxetine (CYMBALTA) 20 MG capsule Take 1 capsule (20 mg total) by mouth daily. To be combined with 60 mg . (Patient not taking: Reported on 12/24/2019) 90 capsule 0    Results for orders placed or performed during the hospital encounter of 12/24/19 (from the past 48 hour(s))  ROM Plus ([place] only)     Status: None   Collection Time: 12/24/19 10:52 AM  Result Value Ref Range  Rom Plus POSITIVE     Comment: Performed at [facility], [address], [place], [place] [address]   No results found.  Review of Systems  Constitutional: Negative for chills and fever.  HENT: Negative for congestion, hearing loss and sinus pain.   Respiratory: Negative for cough, shortness of breath and wheezing.   Cardiovascular: Negative for chest pain, palpitations and leg swelling.  Gastrointestinal: Negative for abdominal pain, constipation, diarrhea, nausea and vomiting.  Genitourinary: Negative for dysuria, flank pain, frequency, hematuria and urgency.  Musculoskeletal: Negative for back pain.  Skin: Negative for rash.  Neurological: Negative for dizziness and headaches.  Psychiatric/Behavioral: Negative for suicidal ideas. The patient is not nervous/anxious.     Blood pressure 117/72, pulse (!) 115, temperature 98.4 F (36.9 C), temperature source Oral,  resp. rate 16, height 5' 2"  (1.575 m), weight 88.9 kg, last menstrual period 04/02/2019. Physical Exam Vitals and nursing note reviewed.  Constitutional:      Appearance: She is well-developed.  HENT:     Head: Normocephalic and atraumatic.  Cardiovascular:     Rate and Rhythm: Normal rate and regular rhythm.  Pulmonary:     Effort: Pulmonary effort is normal.     Breath sounds: Normal breath sounds.  Abdominal:     General: Bowel sounds are normal.     Palpations: Abdomen is soft.  Musculoskeletal:        General: Normal range of motion.  Skin:    General: Skin is warm and dry.  Neurological:     Mental Status: She is alert and oriented to person, place, and time.  Psychiatric:        Behavior: Behavior normal.        Thought Content: Thought content normal.        Judgment: Judgment normal.    NST: 125 bpm baseline, moderate variability, 15x15 accelerations, no decelerations. Tocometer : quiet   Assessment/Plan 29 yo with prelabor rupture of membranes 1. Will begin IOL with cytotec.  2. Diet discussed 3. Epidural as needed 4. IV fluids 5. Labs 6. GBS negative   Homero Fellers, MD 12/24/2019, 12:10 PM

## 2019-12-24 NOTE — Anesthesia Preprocedure Evaluation (Signed)
Anesthesia Evaluation  Patient identified by MRN, date of birth, ID band Patient awake    Reviewed: Allergy & Precautions, H&P , NPO status , Patient's Chart, lab work & pertinent test results  Airway Mallampati: II  TM Distance: >3 FB     Dental  (+) Teeth Intact   Pulmonary neg pulmonary ROS, former smoker,    breath sounds clear to auscultation       Cardiovascular Exercise Tolerance: Good (-) hypertensionnegative cardio ROS   Rhythm:regular Rate:Normal     Neuro/Psych PSYCHIATRIC DISORDERS Anxiety Depression    GI/Hepatic PUD,   Endo/Other    Renal/GU   negative genitourinary   Musculoskeletal   Abdominal   Peds  Hematology  (+) Blood dyscrasia, anemia ,   Anesthesia Other Findings Past Medical History: No date: Autoimmune disease ([place]) No date: Colitis No date: Depression No date: Fibromyalgia  Past Surgical History: No date: OVARY SURGERY; Bilateral     Comment:  cyst removal  BMI    Body Mass Index: 35.85 kg/m      Reproductive/Obstetrics (+) Pregnancy                             Anesthesia Physical Anesthesia Plan  ASA: II  Anesthesia Plan: Epidural   Post-op Pain Management:    Induction:   PONV Risk Score and Plan:   Airway Management Planned:   Additional Equipment:   Intra-op Plan:   Post-operative Plan:   Informed Consent: I have reviewed the patients History and Physical, chart, labs and discussed the procedure including the risks, benefits and alternatives for the proposed anesthesia with the patient or authorized representative who has indicated his/her understanding and acceptance.       Plan Discussed with: Anesthesiologist  Anesthesia Plan Comments:         Anesthesia Quick Evaluation

## 2019-12-25 ENCOUNTER — Encounter: Payer: Self-pay | Admitting: Obstetrics and Gynecology

## 2019-12-25 DIAGNOSIS — Z3A37 37 weeks gestation of pregnancy: Secondary | ICD-10-CM

## 2019-12-25 DIAGNOSIS — O4202 Full-term premature rupture of membranes, onset of labor within 24 hours of rupture: Secondary | ICD-10-CM

## 2019-12-25 LAB — RPR: RPR Ser Ql: NONREACTIVE

## 2019-12-25 MED ORDER — MISOPROSTOL 200 MCG PO TABS
800.0000 ug | ORAL_TABLET | Freq: Once | ORAL | Status: AC
  Administered 2019-12-25: 800 ug via RECTAL

## 2019-12-25 MED ORDER — BENZOCAINE-MENTHOL 20-0.5 % EX AERO
1.0000 "application " | INHALATION_SPRAY | CUTANEOUS | Status: DC | PRN
  Administered 2019-12-25: 1 via TOPICAL
  Filled 2019-12-25: qty 56

## 2019-12-25 MED ORDER — ZOLPIDEM TARTRATE 5 MG PO TABS: 5.0000 mg | ORAL_TABLET | Freq: Every evening | ORAL | Status: DC | PRN

## 2019-12-25 MED ORDER — ACETAMINOPHEN 500 MG PO TABS
1000.0000 mg | ORAL_TABLET | Freq: Four times a day (QID) | ORAL | Status: DC
  Administered 2019-12-25 – 2019-12-26 (×6): 1000 mg via ORAL
  Filled 2019-12-25 (×6): qty 2

## 2019-12-25 MED ORDER — ONDANSETRON HCL 4 MG/2ML IJ SOLN: 4.0000 mg | INTRAMUSCULAR | Status: DC | PRN

## 2019-12-25 MED ORDER — IBUPROFEN 600 MG PO TABS
600.0000 mg | ORAL_TABLET | Freq: Four times a day (QID) | ORAL | Status: DC
  Administered 2019-12-25 – 2019-12-26 (×5): 600 mg via ORAL
  Filled 2019-12-25 (×6): qty 1

## 2019-12-25 MED ORDER — DIBUCAINE (PERIANAL) 1 % EX OINT
1.0000 "application " | TOPICAL_OINTMENT | CUTANEOUS | Status: DC | PRN
  Administered 2019-12-25: 1 via RECTAL
  Filled 2019-12-25: qty 28

## 2019-12-25 MED ORDER — TETANUS-DIPHTH-ACELL PERTUSSIS 5-2.5-18.5 LF-MCG/0.5 IM SUSP
0.5000 mL | Freq: Once | INTRAMUSCULAR | Status: DC
  Filled 2019-12-25: qty 0.5

## 2019-12-25 MED ORDER — WITCH HAZEL-GLYCERIN EX PADS
1.0000 "application " | MEDICATED_PAD | CUTANEOUS | Status: DC | PRN
  Administered 2019-12-25: 1 via TOPICAL
  Filled 2019-12-25: qty 100

## 2019-12-25 MED ORDER — ONDANSETRON HCL 4 MG PO TABS
4.0000 mg | ORAL_TABLET | ORAL | Status: DC | PRN
  Filled 2019-12-25: qty 1

## 2019-12-25 MED ORDER — VARICELLA VIRUS VACCINE LIVE 1350 PFU/0.5ML IJ SUSR
0.5000 mL | Freq: Once | INTRAMUSCULAR | Status: DC
  Filled 2019-12-25: qty 0.5

## 2019-12-25 MED ORDER — DOCUSATE SODIUM 100 MG PO CAPS
100.0000 mg | ORAL_CAPSULE | Freq: Two times a day (BID) | ORAL | Status: DC
  Administered 2019-12-25: 100 mg via ORAL
  Filled 2019-12-25 (×3): qty 1

## 2019-12-25 MED ORDER — SIMETHICONE 80 MG PO CHEW: 80.0000 mg | CHEWABLE_TABLET | ORAL | Status: DC | PRN

## 2019-12-25 MED ORDER — COCONUT OIL OIL: 1.0000 "application " | TOPICAL_OIL | Status: DC | PRN

## 2019-12-25 MED ORDER — METHYLERGONOVINE MALEATE 0.2 MG/ML IJ SOLN
INTRAMUSCULAR | Status: AC
  Administered 2019-12-25: 0.2 mg
  Filled 2019-12-25: qty 1

## 2019-12-25 MED ORDER — DIPHENHYDRAMINE HCL 25 MG PO CAPS: 25.0000 mg | ORAL_CAPSULE | Freq: Four times a day (QID) | ORAL | Status: DC | PRN

## 2019-12-25 MED ORDER — PRENATAL MULTIVITAMIN CH
1.0000 | ORAL_TABLET | Freq: Every day | ORAL | Status: DC
  Administered 2019-12-25 – 2019-12-26 (×2): 1 via ORAL
  Filled 2019-12-25 (×2): qty 1

## 2019-12-25 NOTE — Progress Notes (Signed)
Post Partum Day 0 Subjective: no complaints, up ad lib, voiding, tolerating PO and she denies any pain. Feeling very tired as she has not slept since her delivery.  Objective: Blood pressure 123/78, pulse 97, temperature 98.5 F (36.9 C), resp. rate 18, height 5' 2"  (1.575 m), weight 88.9 kg, last menstrual period 04/02/2019, SpO2 99 %, unknown if currently breastfeeding.  Physical Exam:  General: cooperative, fatigued and no distress Lochia: appropriate Uterine Fundus: firm Incision: perineum with minimal edema DVT Evaluation: No evidence of DVT seen on physical exam. Negative Homan's sign. No cords or calf tenderness.  Recent Labs    12/24/19 1320  HGB 10.5*  HCT 31.6*    Assessment/Plan: Breastfeeding, Lactation consult and Contraception IUD anticipated. Rest encouraged for now. continue Postpartum orders   LOS: 1 day   Imagene Riches 12/25/2019, 10:49 AM

## 2019-12-25 NOTE — Progress Notes (Signed)
FSE and IUPC placed.  Continue with expectant management of IOL SVE 5/90/-2  NST: 125 bpm baseline, moderate variability, 15x15 accelerations, no decelerations. Tocometer : every 1-4 minutes  Adrian Prows MD, [place], [facility] 12/25/2019 2:55 AM

## 2019-12-25 NOTE — TOC Initial Note (Signed)
Transition of Care [facility]) - Initial/Assessment Note    Patient Details  Name: Stephanie Ayala MRN: [id number] Date of Birth: 1990/11/05  Transition of Care [facility]) CM/SW Contact:    Candie Chroman, LCSW Phone Number: 12/25/2019, 3:30 PM  Clinical Narrative:  This CSW working remote today. CSW called patient in room, introduced role, and explained that discharge planning would be discussed. Patient lives home with her husband. Her mother and best friend live next door. Patient plans to stay at home with her baby. She has all the needed supplies. She has a follow up appointment scheduled with her OBGYN but no pediatric appointment scheduled for her daughter yet. She stated that she thinks the nurse is supposed to call about that so CSW sent RN a secure chat to confirm. Patient confirms history of depression but reports no concerns. She takes Cymbalta regularly and follows up with a therapist and psychiatrist. She was supposed to have an appointment with her therapist tomorrow but she stated she will have to call to reschedule. Patient's affect appears appropriate. No concerns noted at this time. CSW encouraged patient to follow up as needed. CSW will continue to follow patient for support and facilitate return home once discharged.                Expected Discharge Plan: Home/Self Care Barriers to Discharge: Continued Medical Work up   Patient Goals and CMS Choice        Expected Discharge Plan and Services Expected Discharge Plan: Home/Self Care     Post Acute Care Choice: NA Living arrangements for the past 2 months: Single Family Home                                      Prior Living Arrangements/Services Living arrangements for the past 2 months: Single Family Home Lives with:: Spouse Patient language and need for interpreter reviewed:: Yes Do you feel safe going back to the place where you live?: Yes      Need for Family Participation in Patient Care: Yes (Comment) Care  giver support system in place?: Yes (comment)   Criminal Activity/Legal Involvement Pertinent to Current Situation/Hospitalization: No - Comment as needed  Activities of Daily Living Home Assistive Devices/Equipment: None ADL Screening (condition at time of admission) Patient's cognitive ability adequate to safely complete daily activities?: Yes Is the patient deaf or have difficulty hearing?: No Does the patient have difficulty seeing, even when wearing glasses/contacts?: No Does the patient have difficulty concentrating, remembering, or making decisions?: No Patient able to express need for assistance with ADLs?: Yes Does the patient have difficulty dressing or bathing?: No Independently performs ADLs?: Yes (appropriate for developmental age) Does the patient have difficulty walking or climbing stairs?: No Weakness of Legs: None Weakness of Arms/Hands: None  Permission Sought/Granted                  Emotional Assessment Appearance:: Appears stated age Attitude/Demeanor/Rapport: Engaged, Gracious Affect (typically observed): Accepting, Appropriate, Calm, Pleasant Orientation: : Oriented to Self, Oriented to Place, Oriented to  Time, Oriented to Situation Alcohol / Substance Use: Not Applicable Psych Involvement: Outpatient Provider  Admission diagnosis:  ROM (rupture of membranes), premature [O42.90] Patient Active Problem List   Diagnosis Date Noted  . ROM (rupture of membranes), premature 12/24/2019  . MDD (major depressive disorder), recurrent episode, mild ([place]) 12/21/2019  . GAD (generalized anxiety disorder) 12/21/2019  .  COVID-19 virus detected 11/18/2019  . Acute hypoxemic respiratory failure due to COVID-19 ([place]) 11/15/2019  . Lower respiratory tract infection due to COVID-19 virus 11/14/2019  . COVID-19 virus infection 11/13/2019  . Iron deficiency anemia 11/13/2019  . [redacted] weeks gestation of pregnancy   . Pneumonia due to COVID-19 virus   . Excessive fetal  growth affecting management of mother in third trimester, antepartum 11/03/2019  . Supervision of high risk pregnancy, antepartum 05/26/2019  . CIN I (cervical intraepithelial neoplasia I) 04/28/2018  . Fibromyalgia 09/29/2017  . Depressive disorder 09/29/2017  . Ulcerative colitis ([place]) 09/29/2017  . Low grade squamous intraepith lesion on cytologic smear cervix (lgsil) 04/09/2017   PCP:  [facility]:   [id number] [place] [address] [id number]: [phone]: [phone]    Social Determinants of Health (SDOH) Interventions    Readmission Risk Interventions No flowsheet data found.

## 2019-12-25 NOTE — Discharge Instructions (Signed)
Discharge Instructions:   Follow-up Appointment: 1 week, please call the office to schedule  If there are any new medications, they have been ordered and will be available for pickup at the listed pharmacy on your way home from the hospital.   Call the office if you have any of the following: headache, visual changes, fever >101.0 F, chills, shortness of breath, breast concerns, excessive vaginal bleeding, incision drainage or problems, leg pain or redness, depression or any other concerns. If you have vaginal discharge with an odor, let your doctor know.   It is normal to bleed for up to 6 weeks. You should not soak through more than 1 pad in 1 hour. If you have a blood clot larger than your fist with continued bleeding, call your doctor.   Activity: Do not lift > 15 lbs for 6 weeks (do not lift anything heavier than your baby). No intercourse, tampons, swimming pools, hot tubs, baths (only showers) for 6 weeks.  No driving for 1-2 weeks. Do not drive while taking narcotic or opioid pain medication.  Continue taking your prenatal vitamin, especially if breastfeeding. Increase calories and fluids (water) while breastfeeding.   Your milk will come in, in the next couple of days (right now it is colostrum). You may have a slight fever when your milk comes in, but it should go away on its own.  If it does not, and rises above 101 F please call the doctor. You will also feel achy and your breasts will be firm. They will also start to leak. If you are breastfeeding, continue as you have been and you can pump/express milk for comfort.   If you have too much milk, your breasts can become engorged, which could lead to mastitis. This is an infection of the milk ducts. It can be very painful and you will need to notify your doctor to obtain a prescription for antibiotics. You can also treat it with a shower or hot/cold compress.   For concerns about your baby, please call your pediatrician.  For  breastfeeding concerns, the lactation consultant can be reached at [phone].   Postpartum blues (feelings of happy one minute and sad another minute) are normal for the first few weeks but if it gets worse let your doctor know.   Congratulations! We enjoyed caring for you and your new bundle of joy!

## 2019-12-25 NOTE — Lactation Note (Signed)
This note was copied from a baby's chart. Lactation Consultation Note  Patient Name: Stephanie Ayala Date: 12/25/2019   Lactation Rounds: LC to the room for initial visit. Mother stated baby had latched well in the beginning and now she she is only feeding for a few times. LC reviewed normal feeding patterns in the newborn. Encouraged skin to skin and hand expression for milk production and to entice baby to feed. Mother expressed drops and baby latched easily for her. Mother stated her nipple is compressed after baby feeds. LC did not due an oral assessment. Encouraged deep latch and positioning baby well at the breast. If pinching and compression continue we ask for an assessment for possible restriction. Reviewed and encouraged 8 or more good feeds in 24 hours, voids and stools for days of life. Mother is talking about purchasing an electric pump. Discussed the differences with battery and plug in types and how effective they can be. Faxon # on board. Encouraged to call with any question, needs or concerns.       [name] D [name] 12/25/2019, 3:24 PM

## 2019-12-25 NOTE — Anesthesia Procedure Notes (Addendum)
Epidural Patient location during procedure: OB Start time: 12/24/2019 11:34 PM End time: 12/24/2019 11:44 PM  Staffing Anesthesiologist: Tera Mater, MD Performed: anesthesiologist   Preanesthetic Checklist Completed: patient identified, IV checked, site marked, risks and benefits discussed, surgical consent, monitors and equipment checked, pre-op evaluation and timeout performed  Epidural Patient position: sitting Prep: ChloraPrep Patient monitoring: heart rate, continuous pulse ox and blood pressure Approach: midline Location: L4-L5 Injection technique: LOR saline  Needle:  Needle type: Tuohy  Needle gauge: 18 G Needle length: 9 cm and 9 Needle insertion depth: 5 cm Catheter type: closed end flexible Catheter size: 20 Guage Catheter at skin depth: 9 cm Test dose: negative and 1.5% lidocaine with Epi 1:200 K  Assessment Events: blood not aspirated, injection not painful, no injection resistance, no paresthesia and negative IV test  Additional Notes Risks and benefits of procedure discussed with patient.  Risks including but not limited to infection, spinal/epidural hematoma, nerve injury, post dural puncture headache, and inadequate/failed block.  Patient expressed understanding and consented to epidural placement. Negative dural puncture.  Negative aspiration.  Negative paresthesia on injection.  Dose given in divided aliquots.  Patient tolerated the procedure well with no immediate complications.   Reason for block:procedure for pain

## 2019-12-25 NOTE — Discharge Summary (Addendum)
Postpartum Discharge Summary  Date of Service updated: 12/26/2019  Patient Name: Stephanie Ayala DOB: 1990-06-17 MRN: [id number]  Date of admission: 12/24/2019 Delivery date:12/25/2019  Delivering provider: Adrian Prows R  Date of discharge: 12/27/2019  Admitting diagnosis: ROM (rupture of membranes), premature [O42.90] Intrauterine pregnancy: [redacted]w[redacted]d    Secondary diagnosis:  Active Problems:   Depressive disorder   ROM (rupture of membranes), premature   Encounter for care or examination of lactating mother   Postpartum care following vaginal delivery  Additional problems: Postpartum hemorrhage     Discharge diagnosis: Term Pregnancy Delivered                                              Post partum procedures:rhogam Augmentation: Pitocin and Cytotec Complications: [id number] Hospital course: Induction of Labor With Vaginal Delivery   29y.o. yo G2P0010 at 370w1das admitted to the hospital 12/24/2019 for induction of labor.  Indication for induction: PROM.  Patient had an uncomplicated labor course as follows: Membrane Rupture Time/Date: 6:00 AM ,12/24/2019   Delivery Method:Vaginal, Spontaneous  Episiotomy: None  Lacerations:  Labial  Details of delivery can be found in separate delivery note.  Patient had a routine postpartum course. She was seen by social work due to elevated EPDS. Patient has a plan to follow up with her therapist and continue taking Cymbalta. Patient is discharged home 12/27/19.  Newborn Data: Birth date:12/25/2019  Birth time:4:49 AM  Gender:Female  Living status:Living  Apgars:8 ,9  Weight:3380 g   Magnesium Sulfate received: No BMZ received: Yes Rhophylac:Yes MMR:Yes  Varivax: Yes T-DaP:Given postpartum Flu: No Transfusion:No  Physical exam  Vitals:   12/26/19 0804 12/26/19 1535 12/26/19 2315 12/27/19 0824  BP: 114/77 124/77 119/81 125/78  Pulse: 81 (!) 105 100 98  Resp: _0 Temp: 98.5 F (36.9 C) 98.5 F (36.9  C) 98.4 F (36.9 C) 98.7 F (37.1 C)  TempSrc: Oral Oral Oral Oral  SpO2: 95% 99% 97% 98%  Weight:      Height:       General: alert, cooperative and no distress Lochia: appropriate Uterine Fundus: firm Incision: N/A DVT Evaluation: No evidence of DVT seen on physical exam. Labs: Lab Results  Component Value Date   WBC 15.1 (H) 12/26/2019   HGB 9.8 (L) 12/26/2019   HCT 29.1 (L) 12/26/2019   MCV 86.6 12/26/2019   PLT 343 12/26/2019   CMP Latest Ref Rng & Units 11/17/2019  Glucose 70 - 99 mg/dL 125(H)  BUN 6 - 20 mg/dL 6  Creatinine 0.44 - 1.00 mg/dL 0.37(L)  Sodium 135 - 145 mmol/L 137  Potassium 3.5 - 5.1 mmol/L 3.8  Chloride 98 - 111 mmol/L 104  CO2 22 - 32 mmol/L 23  Calcium 8.9 - 10.3 mg/dL 8.4(L)  Total Protein 6.5 - 8.1 g/dL 5.3(L)  Total Bilirubin 0.3 - 1.2 mg/dL 0.9  Alkaline Phos 38 - 126 U/L 83  AST 15 - 41 U/L 23  ALT 0 - 44 U/L 14   Edinburgh Score: Edinburgh Postnatal Depression Scale Screening Tool 12/25/2019  I have been able to laugh and see the funny side of things. 1  I have looked forward with enjoyment to things. 2  I have blamed myself unnecessarily when things went wrong. 2  I have been anxious or worried for no good reason.  3  I have felt scared or panicky for no good reason. 1  Things have been getting on top of me. 3  I have been so unhappy that I have had difficulty sleeping. 2  I have felt sad or miserable. 3  I have been so unhappy that I have been crying. 2  The thought of harming myself has occurred to me. 1  Edinburgh Postnatal Depression Scale Total 20      After visit meds:  Allergies as of 12/27/2019   No Known Allergies     Medication List    TAKE these medications   balsalazide 750 MG capsule Commonly known as: COLAZAL Take 2,250 mg by mouth 3 (three) times daily.   Cholecalciferol 125 MCG (5000 UT) capsule Take by mouth.   DULoxetine 60 MG capsule Commonly known as: CYMBALTA Take 60 mg by mouth daily. What  changed: Another medication with the same name was removed. Continue taking this medication, and follow the directions you see here.   Prenatal 28-0.8 MG Tabs Take 1 tablet by mouth daily.        Discharge home in stable condition Infant Feeding: Breast Infant Disposition:home with mother Discharge instruction: per After Visit Summary and Postpartum booklet. Activity: Advance as tolerated. Pelvic rest for 6 weeks.  Diet: routine diet Anticipated Birth Control: IUD Postpartum Appointment:2 weeks Additional Postpartum F/U: Postpartum Depression checkup   Future Appointments: Future Appointments  Date Time Provider Department Center  01/24/2020  4:40 PM Eappen, Saramma, MD [facility] None  02/05/2020 10:30 AM Schuman, Christanna R, MD [facility] None   Follow up Visit:  Follow-up Information    Schuman, Christanna R, MD. Go in 2 week(s).   Specialty: Obstetrics and Gynecology Why: postpartum mood check Contact information: [address] [place] [address] [phone]                12/27/2019  , CNM  

## 2019-12-26 ENCOUNTER — Ambulatory Visit: Payer: Medicaid Other | Admitting: Licensed Clinical Social Worker

## 2019-12-26 DIAGNOSIS — Z91199 Patient's noncompliance with other medical treatment and regimen due to unspecified reason: Secondary | ICD-10-CM

## 2019-12-26 LAB — CBC
HCT: 29.1 % — ABNORMAL LOW (ref 36.0–46.0)
Hemoglobin: 9.8 g/dL — ABNORMAL LOW (ref 12.0–15.0)
MCH: 29.2 pg (ref 26.0–34.0)
MCHC: 33.7 g/dL (ref 30.0–36.0)
MCV: 86.6 fL (ref 80.0–100.0)
Platelets: 343 10*3/uL (ref 150–400)
RBC: 3.36 MIL/uL — ABNORMAL LOW (ref 3.87–5.11)
RDW: 15.3 % (ref 11.5–15.5)
WBC: 15.1 10*3/uL — ABNORMAL HIGH (ref 4.0–10.5)
nRBC: 0 % (ref 0.0–0.2)

## 2019-12-26 LAB — FETAL SCREEN: Fetal Screen: NEGATIVE

## 2019-12-26 MED ORDER — MEASLES, MUMPS & RUBELLA VAC IJ SOLR
0.5000 mL | Freq: Once | INTRAMUSCULAR | Status: AC
  Administered 2019-12-26: 0.5 mL via SUBCUTANEOUS
  Filled 2019-12-26: qty 0.5

## 2019-12-26 MED ORDER — RHO D IMMUNE GLOBULIN 1500 UNIT/2ML IJ SOSY
300.0000 ug | PREFILLED_SYRINGE | Freq: Once | INTRAMUSCULAR | Status: AC
  Administered 2019-12-26: 300 ug via INTRAVENOUS
  Filled 2019-12-26: qty 2

## 2019-12-26 MED ORDER — VARICELLA VIRUS VACCINE LIVE 1350 PFU/0.5ML IJ SUSR
0.5000 mL | Freq: Once | INTRAMUSCULAR | Status: AC
  Administered 2019-12-26: 0.5 mL via SUBCUTANEOUS
  Filled 2019-12-26: qty 0.5

## 2019-12-26 MED ORDER — ACETAMINOPHEN 500 MG PO TABS
1000.0000 mg | ORAL_TABLET | Freq: Four times a day (QID) | ORAL | Status: DC
  Administered 2019-12-26 – 2019-12-27 (×2): 1000 mg via ORAL
  Filled 2019-12-26 (×2): qty 2

## 2019-12-26 MED ORDER — IBUPROFEN 600 MG PO TABS
600.0000 mg | ORAL_TABLET | Freq: Four times a day (QID) | ORAL | Status: DC
  Administered 2019-12-26 – 2019-12-27 (×2): 600 mg via ORAL
  Filled 2019-12-26 (×2): qty 1

## 2019-12-26 NOTE — Progress Notes (Signed)
Patient complaining of tingling and numbness in R arm that comes and goes. MD notified, no concerns at this time. Will continue to monitor.

## 2019-12-26 NOTE — Anesthesia Postprocedure Evaluation (Signed)
Anesthesia Post Note  Patient: Stephanie Ayala  Procedure(s) Performed: AN AD HOC LABOR EPIDURAL  Patient location during evaluation: Mother Baby Anesthesia Type: Epidural Level of consciousness: awake, awake and alert and oriented Pain management: pain level controlled Vital Signs Assessment: post-procedure vital signs reviewed and stable Respiratory status: spontaneous breathing, respiratory function stable and nonlabored ventilation Cardiovascular status: blood pressure returned to baseline and stable Postop Assessment: no headache and no backache Anesthetic complications: no   No complications documented.   Last Vitals:  Vitals:   12/25/19 2303 12/26/19 0804  BP: 111/64 114/77  Pulse: 98 81  Resp: 18 20  Temp: 36.7 C 36.9 C  SpO2: 98% 95%    Last Pain:  Vitals:   12/26/19 0804  TempSrc: Oral  PainSc:                  [name]

## 2019-12-26 NOTE — Clinical Social Work Maternal (Addendum)
CLINICAL SOCIAL WORK MATERNAL/CHILD NOTE  Patient Details  Name: Stephanie Ayala MRN: [id number] Date of Birth: 1991-03-27  Date:  12/26/2019  Clinical Social Worker Initiating Note:  Kinneth Fujiwara Date/Time: Initiated:  12/26/19/      Child's Name:  Stephanie Ayala   Biological Parents:  Mother, Father   Need for Interpreter:  None   Reason for Referral:  Behavioral Health Concerns   Address:  [address] [place] [address]    Phone number:  [phone] (home)     Additional phone number: None  Household Members/Support Persons (HM/SP):       HM/SP Name Relationship DOB or Age  HM/SP -1        HM/SP -2        HM/SP -3        HM/SP -4        HM/SP -5        HM/SP -6        HM/SP -7        HM/SP -8          Natural Supports (not living in the home):  Extended Family, Friends, Immediate Family, Spouse/significant other   Professional Supports:     Employment:     Type of Work:     Education:      Homebound arranged:    Museum/gallery curator Resources:  Medicaid   Other Resources:  Physicist, medical , [place]   Cultural/Religious Considerations Which May Impact Care:  None  Strengths:  Ability to meet basic needs , Compliance with medical plan , Pediatrician chosen, Home prepared for child , Understanding of illness   Psychotropic Medications:         Pediatrician:    [employer]  Pediatrician List:   [place]      Pediatrician Fax Number:    Risk Factors/Current Problems:  Mental Health Concerns    Cognitive State:  Able to Concentrate , Alert , Goal Oriented    Mood/Affect:  Calm , Happy    CSW Assessment:   CSW called to follow up on assessment completed by [facility] CSW Sarah yesterday. Per chart review, MOB with an [place] Score of 20 and answered question 10 with 1.  CSW spoke with RN Raquel Sarna prior to meeting with MOB. Per RN,  MOB is appropriate and no additional concerns. RN followed up with MD on MOB's answer to question 10 as well.   MOB reported she is feeling tired, but good after having [place]. She reported she has been working on breastfeeding which has improved, which she was happy about. This is her first baby. FOB/spouse is Data processing manager.  Discussed MOB's MH history. She reported she is actively involved with a psychiatrist for [facility] medication management. MOB reported she also had a therapy appointment set up with Nelwyn Salisbury. This was her first appointment and she said she was referred to [facility] by her Psychiatrist. The appointment was supposed to be today, but MOB reported she has rescheduled it due to her being in the hospital after having Baby Zoey. MOB reported her Psychiatrist also increased her Cymbalta dosage to help her cope after having Baby Zoey. CSW provided education and information sheet on PPD. MOB reported she is aware of the chance of PPD and plans to talk to her psychiatrist and therapist about this. CSW  asked MOB if she is having thoughts of harming herself since she answered 1 on question 10 on her [place]. MOB stated she "always has these thoughts, but would never do anything." MOB reported she has had thoughts of harming herself "for years" but "no serious thoughts" and does not have a plan and "would not act on these thoughts." MOB said "I have a great support system." She reported she feels safe at home. She reported she is coping well emotionally at this time. She denied HI or DV. She denied any MH needs or MH support needs at this time.  MOB reported she receives Physicist, medical and plans to apply for [facility]. She was reminded to inform her Food Therapist, music of 45 birth. MOB reported she has a bassinet, new car seat, and all other items needed for Baby. She plans to use [employer]. MOB or FOB can provide transportation to appointments. MOB denied resource  needs at this time.  CSW provided education and information sheet on SIDS. MOB verbalized understanding.   CSW ecouraged MOB to reach out to her Provider with any questions or needs for support or resources, even after discharge.   MOB denied any needs or questions at this time. CSW encouraged MOB to reach out if any arise prior to discharge.   Please re consult CSW if any additional needs or concerns arise.  CSW Plan/Description:  Sudden Infant Death Syndrome (SIDS) Education, Perinatal Mood and Anxiety Disorder (PMADs) Education, Other Patient/Family Education, No Further Intervention Required/No Barriers to Discharge    [place], LCSW 12/26/2019, 1:31 PM

## 2019-12-27 ENCOUNTER — Encounter: Payer: Medicaid Other | Admitting: Obstetrics & Gynecology

## 2019-12-27 LAB — RHOGAM INJECTION: Unit division: 0

## 2019-12-27 NOTE — Lactation Note (Signed)
This note was copied from a baby's chart. Lactation Consultation Note  Patient Name: Stephanie Ayala PRXYV'O Date: 12/27/2019 Reason for consult: Follow-up assessment;1st time breastfeeding;Early term 37-38.6wks;Hyperbilirubinemia  Lactation follow-up before discharge. Baby developed elevated bilirubin levels yesterday, placed under lights overnight, improved and being discharged today. Baby was at the breast when [facility] entered, biological nursing position, audible swallows, LC pulled down on chin to flange bottom lip and added support pillow under mom's arm. LC reviewed BF basics and what to expect in the days to come with BF: newborn stomach size, growth spurts, early cues, feed on demand, milk supply, normal course of lactation, void/stools, breast fullness/management and nipple care.  Parents plan to start pumping around week 2-3 for dad to begin a bottle feed or 2/day: LC provided guidance on pump and bottle introduction and protection of milk supply. Information given for outpatient lactation services and community breastfeeding support given.  Maternal Data Formula Feeding for Exclusion: No Has patient been taught Hand Expression?: Yes Does the patient have breastfeeding experience prior to this delivery?: No  Feeding Feeding Type: Breast Fed  LATCH Score Latch: Grasps breast easily, tongue down, lips flanged, rhythmical sucking. (flanged out bottom lip)  Audible Swallowing: Spontaneous and intermittent  Type of Nipple: Everted at rest and after stimulation  Comfort (Breast/Nipple): Soft / non-tender  Hold (Positioning): Assistance needed to correctly position infant at breast and maintain latch. (slightly sore)  LATCH Score: 9  Interventions Interventions: Breast feeding basics reviewed;Adjust position;Support pillows;Comfort gels  Lactation Tools Discussed/Used     Consult Status      Lavonia Drafts 12/27/2019, 9:39 AM

## 2019-12-27 NOTE — Progress Notes (Signed)
Patient discharged home with infant. Discharge instructions, prescriptions and follow up appointment given to and reviewed with patient. Patient verbalized understanding. Pt wheeled out with infant by auxiliary.

## 2020-01-03 ENCOUNTER — Encounter: Payer: Medicaid Other | Admitting: Advanced Practice Midwife

## 2020-01-24 ENCOUNTER — Encounter: Payer: Self-pay | Admitting: Psychiatry

## 2020-01-24 ENCOUNTER — Telehealth (INDEPENDENT_AMBULATORY_CARE_PROVIDER_SITE_OTHER): Payer: 59 | Admitting: Psychiatry

## 2020-01-24 ENCOUNTER — Other Ambulatory Visit: Payer: Self-pay

## 2020-01-24 DIAGNOSIS — F411 Generalized anxiety disorder: Secondary | ICD-10-CM

## 2020-01-24 DIAGNOSIS — F330 Major depressive disorder, recurrent, mild: Secondary | ICD-10-CM | POA: Diagnosis not present

## 2020-01-24 NOTE — Progress Notes (Signed)
Virtual Visit via Video Note  I connected with Stephanie Ayala on 01/24/20 at  4:40 PM EDT by a video enabled telemedicine application and verified that I am speaking with the correct person using two identifiers.  Location Provider Location : [facility] Patient Location : Home  Participants: Patient , Provider   I discussed the limitations of evaluation and management by telemedicine and the availability of in person appointments. The patient expressed understanding and agreed to proceed.    I discussed the assessment and treatment plan with the patient. The patient was provided an opportunity to ask questions and all were answered. The patient agreed with the plan and demonstrated an understanding of the instructions.   The patient was advised to call back or seek an in-person evaluation if the symptoms worsen or if the condition fails to improve as anticipated.   [place] MD OP Progress Note  01/24/2020 6:03 PM CHINARA HERTZBERG  MRN:  [id number]  Chief Complaint:  Chief Complaint    Follow-up     HPI: Stephanie Ayala is a 29 year old Caucasian female, unemployed, married, lives in [place], has a history of depression, anxiety, was evaluated by telemedicine today.  Patient is currently postpartum 4 weeks.  She delivered a healthy baby girl named Stephanie Ayala.  Patient today reports she is currently doing well.  She denies any significant depression.  She reports she never went up on the Cymbalta dosage as discussed last visit.  She continues to take 60 mg.  She reports she is doing fine on the dosage and is not interested in any changes today.  She reports the baby does wake her up at night however overall she is managing okay.  She tries to take naps when the baby sleeps.  She has support from her husband.  Patient reports that the baby is currently struggling with a fungal infection in her mouth.  She reports she and the baby both are on medications for the same.  She however does not remember the name  and will let writer know once she finds out.  She is currently breast-feeding.  Patient denies any suicidality, homicidality or perceptual disturbances.  Patient denies any other concerns today.  Visit Diagnosis:    ICD-10-CM   1. MDD (major depressive disorder), recurrent episode, mild ([place])  F33.0   2. GAD (generalized anxiety disorder)  F41.1     Past Psychiatric History: I have reviewed past psychiatric history from my progress note on 12/21/2019  Past Medical History:  Past Medical History:  Diagnosis Date  . Autoimmune disease ([place])   . Colitis   . Depression   . Fibromyalgia     Past Surgical History:  Procedure Laterality Date  . OVARY SURGERY Bilateral    cyst removal    Family Psychiatric History: I have reviewed family psychiatric history from my progress note on 12/21/2019  Family History:  Family History  Problem Relation Age of Onset  . Anxiety disorder Mother   . Diabetes Father   . Hypertension Father   . Diabetes Paternal Uncle   . Heart disease Paternal Uncle   . Alcohol abuse Maternal Grandfather   . Heart disease Paternal Grandfather     Social History: I have reviewed social history from my progress note on 12/21/2019 Social History   Socioeconomic History  . Marital status: Married    Spouse name:  Information systems manager  . Number of children: Not on file  . Years of education: Not on file  . Highest  education level: Not on file  Occupational History  . Occupation: Not employed  Tobacco Use  . Smoking status: Former Smoker    Packs/day: 0.00  . Smokeless tobacco: Never Used  Vaping Use  . Vaping Use: Some days  Substance and Sexual Activity  . Alcohol use: Not Currently  . Drug use: No  . Sexual activity: Yes    Birth control/protection: I.U.D.  Other Topics Concern  . Not on file  Social History Narrative  . Not on file   Social Determinants of Health   Financial Resource Strain:   . Difficulty of Paying Living Expenses: Not on file  Food  Insecurity:   . Worried About Charity fundraiser in the Last Year: Not on file  . Ran Out of Food in the Last Year: Not on file  Transportation Needs:   . Lack of Transportation (Medical): Not on file  . Lack of Transportation (Non-Medical): Not on file  Physical Activity:   . Days of Exercise per Week: Not on file  . Minutes of Exercise per Session: Not on file  Stress:   . Feeling of Stress : Not on file  Social Connections:   . Frequency of Communication with Friends and Family: Not on file  . Frequency of Social Gatherings with Friends and Family: Not on file  . Attends Religious Services: Not on file  . Active Member of Clubs or Organizations: Not on file  . Attends Archivist Meetings: Not on file  . Marital Status: Not on file    Allergies: No Known Allergies  Metabolic Disorder Labs: No results found for: HGBA1C, MPG No results found for: PROLACTIN Lab Results  Component Value Date   CHOL 204 (H) 02/12/2017   TRIG 327 (H) 11/13/2019   HDL 53 02/12/2017   CHOLHDL 3.8 02/12/2017   LDLCALC 138 (H) 02/12/2017   Lab Results  Component Value Date   TSH 3.040 02/12/2017    Therapeutic Level Labs: No results found for: LITHIUM No results found for: VALPROATE No components found for:  CBMZ  Current Medications: Current Outpatient Medications  Medication Sig Dispense Refill  . balsalazide (COLAZAL) 750 MG capsule Take 2,250 mg by mouth 3 (three) times daily.    . Cholecalciferol 125 MCG (5000 UT) capsule Take by mouth.    . DULoxetine (CYMBALTA) 60 MG capsule Take 60 mg by mouth daily.     . Prenatal 28-0.8 MG TABS Take 1 tablet by mouth daily.     No current facility-administered medications for this visit.     Musculoskeletal: Strength & Muscle Tone: UTA Gait & Station: normal Patient leans: N/A  Psychiatric Specialty Exam: Review of Systems  Psychiatric/Behavioral: Positive for sleep disturbance.  All other systems reviewed and are  negative.   unknown if currently breastfeeding.There is no height or weight on file to calculate BMI.  General Appearance: Casual  Eye Contact:  Fair  Speech:  Clear and Coherent  Volume:  Normal  Mood:  Euthymic  Affect:  Congruent  Thought Process:  Goal Directed and Descriptions of Associations: Intact  Orientation:  Full (Time, Place, and Person)  Thought Content: Logical   Suicidal Thoughts:  No  Homicidal Thoughts:  No  Memory:  Immediate;   Fair Recent;   Fair Remote;   Fair  Judgement:  Fair  Insight:  Fair  Psychomotor Activity:  Normal  Concentration:  Concentration: Fair and Attention Span: Fair  Recall:  [employer] of Knowledge: Fair  Language: Fair  Akathisia:  No  Handed:  Right  AIMS (if indicated): UTA  Assets:  Communication Skills Desire for Improvement Housing Social Support  ADL's:  Intact  Cognition: WNL  Sleep:  Restless due to having a newborn   Screenings: GAD-7     Routine Prenatal from 10/18/2019 in [facility]  Total GAD-7 Score 15    PHQ2-9     Telemedicine from 12/21/2019 in [place] Appointment from 10/26/2019 in [place] Routine Prenatal from [facility]18/2019 in [facility]  PHQ-2 Total Score 2 3 6   PHQ-9 Total Score 15 -- 19       Assessment and Plan: REONNA FINLAYSON is a 29 year old Caucasian female, unemployed, married, lives in [place], has a history of MDD, GAD, currently postpartum-4 weeks, was evaluated by telemedicine today.  Patient is currently stable on current medication regimen.  Plan as noted below.  Plan MDD-stable Cymbalta 60 mg p.o. daily. Discussed breast-feeding implication of medications. Patient was referred for CBT.  GAD-improving Cymbalta as prescribed.  Pending labs-TSH.  Follow-up in clinic in 1 month or sooner if needed.  I have spent atleast 20 minutes face to face by video with patient today. More than 50 % of the time was spent for  preparing to see the patient ( e.g., review of test, records ), ordering medications and test ,psychoeducation and supportive psychotherapy and care coordination,as well as documenting clinical information in electronic health record. This note was generated in part or whole with voice recognition software. Voice recognition is usually quite accurate but there are transcription errors that can and very often do occur. I apologize for any typographical errors that were not detected and corrected.       Ursula Alert, MD 01/25/2020, 8:09 AM

## 2020-02-05 ENCOUNTER — Ambulatory Visit: Payer: Medicaid Other | Admitting: Obstetrics and Gynecology

## 2020-02-07 ENCOUNTER — Ambulatory Visit (INDEPENDENT_AMBULATORY_CARE_PROVIDER_SITE_OTHER): Payer: Medicaid Other | Admitting: Obstetrics and Gynecology

## 2020-02-07 ENCOUNTER — Encounter: Payer: Self-pay | Admitting: Obstetrics and Gynecology

## 2020-02-07 ENCOUNTER — Other Ambulatory Visit: Payer: Self-pay

## 2020-02-07 NOTE — Progress Notes (Signed)
  OBSTETRICS POSTPARTUM CLINIC PROGRESS NOTE  Subjective:     Stephanie Ayala is a 29 y.o. G78P1011 female who presents for a postpartum visit. She is 6 weeks postpartum following a Term pregnancy and delivery by Vaginal problems after delivery including hemorrhage.  I have fully reviewed the prenatal and intrapartum course. Anesthesia: epidural.  Postpartum course has been complicated by uncomplicated.  Baby is feeding by Breast.  Bleeding: patient has not  resumed menses.  Bowel function is normal. Bladder function is normal.  Patient is not sexually active. Contraception method desired is IUD. Would like IUD in the future. Postpartum depression screening: negative. [place] 10. She feels like her mood has been good. She thinks the Cymbalta helps a lot. She has some situational family anxiety. She is working with a psychiatric specialist and is considering therapy.   She also reports post covid difficulty with smelling and taste. Reports that she can smell some strong smells, but notes that most things taste and smell the same bad taste/smell.   The following portions of the patient's history were reviewed and updated as appropriate: allergies, current medications, past family history, past medical history, past social history, past surgical history and problem list.  Review of Systems Pertinent items are noted in HPI.  Objective:    BP 116/70   Ht 5' 2"  (1.575 m)   Wt 171 lb (77.6 kg)   Breastfeeding Yes   BMI 31.28 kg/m   General:  alert and no distress   Breasts:  inspection negative, no nipple discharge or bleeding, no masses or nodularity palpable  Lungs: clear to auscultation bilaterally  Heart:  regular rate and rhythm, S1, S2 normal, no murmur, click, rub or gallop  Abdomen: soft, non-tender; bowel sounds normal; no masses,  no organomegaly.      Vulva:  normal  Vagina: normal vagina, no discharge, exudate, lesion, or erythema           Rectal Exam: Not performed.           Assessment:  Post Partum Care visit There are no diagnoses linked to this encounter.  Plan:  See orders and Patient Instructions Follow up in: 12 months or as needed.   Adrian Prows MD, [facility], [facility] 02/07/2020 11:18 AM

## 2020-03-04 ENCOUNTER — Encounter: Payer: Self-pay | Admitting: Psychiatry

## 2020-03-04 ENCOUNTER — Other Ambulatory Visit: Payer: Self-pay

## 2020-03-04 ENCOUNTER — Telehealth (INDEPENDENT_AMBULATORY_CARE_PROVIDER_SITE_OTHER): Payer: 59 | Admitting: Psychiatry

## 2020-03-04 DIAGNOSIS — F411 Generalized anxiety disorder: Secondary | ICD-10-CM

## 2020-03-04 DIAGNOSIS — F330 Major depressive disorder, recurrent, mild: Secondary | ICD-10-CM | POA: Diagnosis not present

## 2020-03-04 MED ORDER — DULOXETINE HCL 60 MG PO CPEP: 60.0000 mg | ORAL_CAPSULE | Freq: Every day | ORAL | 1 refills | Status: DC

## 2020-03-04 NOTE — Progress Notes (Signed)
Virtual Visit via Video Note  I connected with Stephanie Ayala on 03/05/20 at  2:20 PM EST by a video enabled telemedicine application and verified that I am speaking with the correct person using two identifiers.  Location Provider Location : [facility] Patient Location : Home  Participants: Patient , Newborn 39 Zoe ,Provider   I discussed the limitations of evaluation and management by telemedicine and the availability of in person appointments. The patient expressed understanding and agreed to proceed.   I discussed the assessment and treatment plan with the patient. The patient was provided an opportunity to ask questions and all were answered. The patient agreed with the plan and demonstrated an understanding of the instructions.   The patient was advised to call back or seek an in-person evaluation if the symptoms worsen or if the condition fails to improve as anticipated.  [place] MD OP Progress Note  03/04/2020 2:42 PM Stephanie Ayala  MRN:  [id number]  Chief Complaint:  Chief Complaint    Follow-up     HPI: Stephanie Ayala is a 29 year old Caucasian female, unemployed, married, lives in [place], has a history of depression, anxiety was evaluated by telemedicine today.  Patient is currently postpartum 2 months.  Patient today reports she and her baby are doing well.  She had a good Thanksgiving holiday.  She reports she spent it with a limited amount of family members due to the pandemic.  She reports mood wise she is doing okay.  She does have episodes of feeling overwhelmed on and off especially due to having some relationship struggles with her mother as well as the baby currently teething.  She however reports she is managing it okay.  Patient denies any suicidality, homicidality or perceptual disturbances.  She reports the baby is sleeping better at night and that does help her.  She denies any side effects to her medication.  She is compliant on the Cymbalta.  Patient denies any  other concerns today.  Visit Diagnosis:    ICD-10-CM   1. MDD (major depressive disorder), recurrent episode, mild (HCC)  F33.0 DULoxetine (CYMBALTA) 60 MG capsule   Improving  2. GAD (generalized anxiety disorder)  F41.1 DULoxetine (CYMBALTA) 60 MG capsule    Past Psychiatric History: I have reviewed past psychiatric history from my progress note from 12/21/2019  Past Medical History:  Past Medical History:  Diagnosis Date  . Autoimmune disease ([place])   . Colitis   . Depression   . Fibromyalgia     Past Surgical History:  Procedure Laterality Date  . OVARY SURGERY Bilateral    cyst removal    Family Psychiatric History: I have reviewed family psychiatric history from my progress note from 12/21/2019  Family History:  Family History  Problem Relation Age of Onset  . Anxiety disorder Mother   . Diabetes Father   . Hypertension Father   . Diabetes Paternal Uncle   . Heart disease Paternal Uncle   . Alcohol abuse Maternal Grandfather   . Heart disease Paternal Grandfather     Social History: I have reviewed social history from my progress note on 12/21/2019 Social History   Socioeconomic History  . Marital status: Married    Spouse name:  Information systems manager  . Number of children: Not on file  . Years of education: Not on file  . Highest education level: Not on file  Occupational History  . Occupation: Not employed  Tobacco Use  . Smoking status: Former Smoker    Packs/day:  0.00  . Smokeless tobacco: Never Used  Vaping Use  . Vaping Use: Some days  Substance and Sexual Activity  . Alcohol use: Not Currently  . Drug use: No  . Sexual activity: Yes    Birth control/protection: I.U.D.  Other Topics Concern  . Not on file  Social History Narrative  . Not on file   Social Determinants of Health   Financial Resource Strain:   . Difficulty of Paying Living Expenses: Not on file  Food Insecurity:   . Worried About Charity fundraiser in the Last Year: Not on file  . Ran Out  of Food in the Last Year: Not on file  Transportation Needs:   . Lack of Transportation (Medical): Not on file  . Lack of Transportation (Non-Medical): Not on file  Physical Activity:   . Days of Exercise per Week: Not on file  . Minutes of Exercise per Session: Not on file  Stress:   . Feeling of Stress : Not on file  Social Connections:   . Frequency of Communication with Friends and Family: Not on file  . Frequency of Social Gatherings with Friends and Family: Not on file  . Attends Religious Services: Not on file  . Active Member of Clubs or Organizations: Not on file  . Attends Archivist Meetings: Not on file  . Marital Status: Not on file    Allergies: No Known Allergies  Metabolic Disorder Labs: No results found for: HGBA1C, MPG No results found for: PROLACTIN Lab Results  Component Value Date   CHOL 204 (H) 02/12/2017   TRIG 327 (H) 11/13/2019   HDL 53 02/12/2017   CHOLHDL 3.8 02/12/2017   LDLCALC 138 (H) 02/12/2017   Lab Results  Component Value Date   TSH 3.040 02/12/2017    Therapeutic Level Labs: No results found for: LITHIUM No results found for: VALPROATE No components found for:  CBMZ  Current Medications: Current Outpatient Medications  Medication Sig Dispense Refill  . balsalazide (COLAZAL) 750 MG capsule Take 2,250 mg by mouth 3 (three) times daily.    . Cholecalciferol 125 MCG (5000 UT) capsule Take by mouth.    . DULoxetine (CYMBALTA) 60 MG capsule Take 1 capsule (60 mg total) by mouth daily. 90 capsule 1  . fluconazole (DIFLUCAN) 100 MG tablet Take by mouth.     No current facility-administered medications for this visit.     Musculoskeletal: Strength & Muscle Tone: UTA Gait & Station: UTA Patient leans: N/A  Psychiatric Specialty Exam: Review of Systems  Psychiatric/Behavioral: Positive for sleep disturbance. The patient is nervous/anxious.   All other systems reviewed and are negative.   currently breastfeeding.There is  no height or weight on file to calculate BMI.  General Appearance: Casual  Eye Contact:  Fair  Speech:  Clear and Coherent  Volume:  Normal  Mood:  Anxious improving  Affect:  Congruent  Thought Process:  Goal Directed and Descriptions of Associations: Intact  Orientation:  Full (Time, Place, and Person)  Thought Content: Logical   Suicidal Thoughts:  No  Homicidal Thoughts:  No  Memory:  Immediate;   Fair Recent;   Fair Remote;   Fair  Judgement:  Fair  Insight:  Fair  Psychomotor Activity:  Normal  Concentration:  Concentration: Fair and Attention Span: Fair  Recall:  [employer] of Knowledge: Fair  Language: Fair  Akathisia:  No  Handed:  Right  AIMS (if indicated): UTA  Assets:  Communication Skills Desire  for Improvement Housing Social Support Talents/Skills  ADL's:  Intact  Cognition: WNL  Sleep:  Improving   Screenings: GAD-7     Routine Pren[facility] from [facility]18/2019 in [facility]  Total GAD-7 Score 15    PHQ2-9     Telemedicine from 12/21/2019 in [place] Appointment from 10/26/2019 in [place] Routine Prenatal from 10/18/2019 in [facility]  PHQ-2 Total Score 2 3 6   PHQ-9 Total Score 15 -- 19       Assessment and Plan: Stephanie Ayala is a 84 year old Caucasian female, unemployed, married, lives in [place], has a history of MDD, GAD, currently postpartum-2 months was evaluated by telemedicine today.  Patient is currently stable on current medication regimen.  Plan as noted below.  Plan MDD-stable Cymbalta 60 mg p.o. daily Patient is aware about breast-feeding implications of medications. She was referred for CBT.  GAD-improving Cymbalta as prescribed  I have reviewed the following labs-TSH-dated 09/05/2019-within normal limits.  Follow-up in clinic in 1 to 2 months or sooner if needed.  I have spent atleast 20 minutes face to face by video with patient today. More than 50 % of the  time was spent for preparing to see the patient ( e.g., review of test, records ),  ordering medications and test ,psychoeducation and supportive psychotherapy and care coordination,as well as documenting clinical information in electronic health record,interpreting and communication of test results This note was generated in part or whole with voice recognition software. Voice recognition is usually quite accurate but there are transcription errors that can and very often do occur. I apologize for any typographical errors that were not detected and corrected.        Ursula Alert, MD 03/05/2020, 8:32 AM

## 2020-04-22 ENCOUNTER — Telehealth (INDEPENDENT_AMBULATORY_CARE_PROVIDER_SITE_OTHER): Payer: 59 | Admitting: Psychiatry

## 2020-04-22 ENCOUNTER — Other Ambulatory Visit: Payer: Self-pay

## 2020-04-22 DIAGNOSIS — Z5329 Procedure and treatment not carried out because of patient's decision for other reasons: Secondary | ICD-10-CM

## 2020-04-22 NOTE — Progress Notes (Signed)
No response to call or text or video invite  

## 2020-09-18 ENCOUNTER — Other Ambulatory Visit: Payer: Self-pay | Admitting: Psychiatry

## 2020-09-18 DIAGNOSIS — F411 Generalized anxiety disorder: Secondary | ICD-10-CM

## 2020-09-18 DIAGNOSIS — F330 Major depressive disorder, recurrent, mild: Secondary | ICD-10-CM

## 2020-09-20 ENCOUNTER — Other Ambulatory Visit: Payer: Self-pay | Admitting: Psychiatry

## 2020-09-20 DIAGNOSIS — F411 Generalized anxiety disorder: Secondary | ICD-10-CM

## 2020-09-20 DIAGNOSIS — F330 Major depressive disorder, recurrent, mild: Secondary | ICD-10-CM

## 2020-09-24 ENCOUNTER — Telehealth: Payer: Self-pay

## 2020-09-24 DIAGNOSIS — F330 Major depressive disorder, recurrent, mild: Secondary | ICD-10-CM

## 2020-09-24 DIAGNOSIS — F411 Generalized anxiety disorder: Secondary | ICD-10-CM

## 2020-09-24 MED ORDER — DULOXETINE HCL 60 MG PO CPEP: 60.0000 mg | ORAL_CAPSULE | Freq: Every day | ORAL | 0 refills | Status: DC

## 2020-09-24 NOTE — Telephone Encounter (Signed)
Rx sent. Thanks

## 2020-09-24 NOTE — Telephone Encounter (Signed)
Medication management - Telephone message left from patient requesting a refill of her prescribed Duloxetine, last ordered 03/04/20 #90 + 1 refill. Pt. no showed 04/22/20 and has rescheduled for 10/23/20.  Request one time refill to last until made appt.

## 2020-10-23 ENCOUNTER — Telehealth (INDEPENDENT_AMBULATORY_CARE_PROVIDER_SITE_OTHER): Payer: 59 | Admitting: Psychiatry

## 2020-10-23 ENCOUNTER — Other Ambulatory Visit: Payer: Self-pay

## 2020-10-23 ENCOUNTER — Encounter: Payer: Self-pay | Admitting: Psychiatry

## 2020-10-23 DIAGNOSIS — F3342 Major depressive disorder, recurrent, in full remission: Secondary | ICD-10-CM | POA: Diagnosis not present

## 2020-10-23 DIAGNOSIS — F411 Generalized anxiety disorder: Secondary | ICD-10-CM

## 2020-10-23 MED ORDER — DULOXETINE HCL 30 MG PO CPEP: 30.0000 mg | ORAL_CAPSULE | Freq: Every day | ORAL | 0 refills | Status: DC

## 2020-10-23 NOTE — Progress Notes (Signed)
Virtual Visit via Video Note  I connected with Stephanie Ayala on 10/23/20 at 11:30 AM EDT by a video enabled telemedicine application and verified that I am speaking with the correct person using two identifiers.  Location Provider Location : [facility] Patient Location : Home  Participants: Patient , Provider    I discussed the limitations of evaluation and management by telemedicine and the availability of in person appointments. The patient expressed understanding and agreed to proceed.     I discussed the assessment and treatment plan with the patient. The patient was provided an opportunity to ask questions and all were answered. The patient agreed with the plan and demonstrated an understanding of the instructions.   The patient was advised to call back or seek an in-person evaluation if the symptoms worsen or if the condition fails to improve as anticipated.   [place] MD OP Progress Note  10/23/2020 1:26 PM Stephanie Ayala  MRN:  [id number]  Chief Complaint:  Chief Complaint   Follow-up; Anxiety    HPI: Stephanie Ayala is a 30 year old Caucasian female, employed, married, lives in [place], has a history of depression, anxiety was evaluated by telemedicine today.  Patient was last evaluated on 03/04/2020.  Patient has been noncompliant with follow-up visits.  Patient today returns reporting that she has been struggling with significant anxiety symptoms.  She worries a lot, is constantly restless, irritable.  She has been biting her nails due to her anxiety symptoms on a regular basis.  She reports she is currently taking the Cymbalta however that at this dosage does not seem to help.  She denies any significant depression.  She is able to take care of her home as well as take care of her baby who is currently 62 months old.  She does have social support from her mom who lives next door.  She reports she is currently struggling with relationship struggles with her spouse.  They are currently  following up with a family therapist on a weekly basis.  In spite of that it does not seem to help and this makes her anxiety worse.  She is currently not in individual therapy however is agreeable to start seeing a therapist.  She denies any suicidality, homicidality or perceptual disturbances.  Patient denies any other concerns today.  Visit Diagnosis:    ICD-10-CM   1. MDD (major depressive disorder), recurrent, in full remission ([place])  F33.42 DULoxetine (CYMBALTA) 30 MG capsule    2. GAD (generalized anxiety disorder)  F41.1 DULoxetine (CYMBALTA) 30 MG capsule      Past Psychiatric History: Reviewed past psychiatric history from progress note on 12/21/2019  Past Medical History:  Past Medical History:  Diagnosis Date   Autoimmune disease ([place])    Colitis    Depression    Fibromyalgia     Past Surgical History:  Procedure Laterality Date   OVARY SURGERY Bilateral    cyst removal    Family Psychiatric History: Reviewed family psychiatric history from progress note on 12/21/2019  Family History:  Family History  Problem Relation Age of Onset   Anxiety disorder Mother    Diabetes Father    Hypertension Father    Diabetes Paternal Uncle    Heart disease Paternal Uncle    Alcohol abuse Maternal Grandfather    Heart disease Paternal Grandfather     Social History: Reviewed social history from progress note on 12/21/2019 Social History   Socioeconomic History   Marital status: Married  Spouse name:  Dylan   Number of children: Not on file   Years of education: Not on file   Highest education level: Not on file  Occupational History   Occupation: Not employed  Tobacco Use   Smoking status: Former    Packs/day: 0.00    Types: Cigarettes   Smokeless tobacco: Never  Vaping Use   Vaping Use: Some days  Substance and Sexual Activity   Alcohol use: Not Currently   Drug use: No   Sexual activity: Yes    Birth control/protection: I.U.D.  Other Topics Concern    Not on file  Social History Narrative   Not on file   Social Determinants of Health   Financial Resource Strain: Not on file  Food Insecurity: Not on file  Transportation Needs: Not on file  Physical Activity: Not on file  Stress: Not on file  Social Connections: Not on file    Allergies: No Known Allergies  Metabolic Disorder Labs: No results found for: HGBA1C, MPG No results found for: PROLACTIN Lab Results  Component Value Date   CHOL 204 (H) 02/12/2017   TRIG 327 (H) 11/13/2019   HDL 53 02/12/2017   CHOLHDL 3.8 02/12/2017   LDLCALC 138 (H) 02/12/2017   Lab Results  Component Value Date   TSH 3.040 02/12/2017    Therapeutic Level Labs: No results found for: LITHIUM No results found for: VALPROATE No components found for:  CBMZ  Current Medications: Current Outpatient Medications  Medication Sig Dispense Refill   acetaminophen (TYLENOL) 500 MG tablet Take 500 mg by mouth every 6 (six) hours as needed.     balsalazide (COLAZAL) 750 MG capsule Take 2,250 mg by mouth 3 (three) times daily.     DULoxetine (CYMBALTA) 30 MG capsule Take 1 capsule (30 mg total) by mouth daily. Take along with 60 mg daily 90 capsule 0   DULoxetine (CYMBALTA) 60 MG capsule Take 1 capsule (60 mg total) by mouth daily. 90 capsule 0   Cholecalciferol 125 MCG (5000 UT) capsule Take by mouth. (Patient not taking: Reported on 10/23/2020)     fluconazole (DIFLUCAN) 100 MG tablet Take by mouth. (Patient not taking: Reported on 10/23/2020)     No current facility-administered medications for this visit.     Musculoskeletal: Strength & Muscle Tone:  UTA Gait & Station:  UTA Patient leans: N/A  Psychiatric Specialty Exam: Review of Systems  Psychiatric/Behavioral:  Positive for decreased concentration. The patient is nervous/anxious.   All other systems reviewed and are negative.  currently breastfeeding.There is no height or weight on file to calculate BMI.  General Appearance: Casual  Eye  Contact:  Fair  Speech:  Clear and Coherent  Volume:  Normal  Mood:  Anxious  Affect:  Congruent  Thought Process:  Goal Directed and Descriptions of Associations: Intact  Orientation:  Full (Time, Place, and Person)  Thought Content: Logical   Suicidal Thoughts:  No  Homicidal Thoughts:  No  Memory:  Immediate;   Fair Recent;   Fair Remote;   Fair  Judgement:  Fair  Insight:  Fair  Psychomotor Activity:  Normal  Concentration:  Concentration: Fair and Attention Span: Fair  Recall:  [employer] of Knowledge: Fair  Language: Fair  Akathisia:  No  Handed:  Right  AIMS (if indicated): not done  Assets:  Communication Skills Desire for Improvement Housing Social Support Talents/Skills  ADL's:  Intact  Cognition: WNL  Sleep:  Fair   Screenings: GAD-7  Flowsheet Row Video Visit from 10/23/2020 in [place] Routine Prenatal from 10/18/2019 in [facility]  Total GAD-7 Score 15 15      PHQ2-9    [place] from 12/21/2019 in [place] Appointment from 10/26/2019 in [place] Routine Prenatal from [facility]18/2019 in [facility]  PHQ-2 Total Score 2 3 6   PHQ-9 Total Score 15 -- 19        Assessment and Plan: Stephanie Ayala is a 30 year old Caucasian female, unemployed, married, lives in [place], has a history of MDD, GAD, was evaluated by telemedicine today.  Patient has been noncompliant with follow-up recommendations.  Patient is currently struggling with relationship struggles which does have an impact on her mood.  Plan as noted below.  Plan MDD-in remission Cymbalta as prescribed  GAD-unstable Increase Cymbalta to 90 mg p.o. daily Discussed pregnancy, breast-feeding implications.  Patient is currently breast-feeding. Patient encouraged to start psychotherapy sessions with an individual therapist.  She could also continue family therapy.  Follow-up in  clinic in 6 weeks or sooner if needed.  This note was generated in part or whole with voice recognition software. Voice recognition is usually quite accurate but there are transcription errors that can and very often do occur. I apologize for any typographical errors that were not detected and corrected.       Ursula Alert, MD 10/25/2020, 2:01 PM

## 2020-11-20 ENCOUNTER — Encounter: Payer: Self-pay | Admitting: Gastroenterology

## 2020-11-20 ENCOUNTER — Ambulatory Visit (INDEPENDENT_AMBULATORY_CARE_PROVIDER_SITE_OTHER): Payer: 59 | Admitting: Gastroenterology

## 2020-11-20 ENCOUNTER — Other Ambulatory Visit: Payer: Self-pay

## 2020-11-20 VITALS — BP 131/92 | HR 101 | Temp 98.4°F | Ht 62.0 in | Wt 196.2 lb

## 2020-11-20 DIAGNOSIS — D500 Iron deficiency anemia secondary to blood loss (chronic): Secondary | ICD-10-CM

## 2020-11-20 DIAGNOSIS — K51011 Ulcerative (chronic) pancolitis with rectal bleeding: Secondary | ICD-10-CM

## 2020-11-20 MED ORDER — NA SULFATE-K SULFATE-MG SULF 17.5-3.13-1.6 GM/177ML PO SOLN: 354.0000 mL | Freq: Once | ORAL | 0 refills | Status: AC

## 2020-11-20 NOTE — Progress Notes (Signed)
Cephas Darby, MD [address]  [place]  [place], [place] [address]  Main: [phone]  Fax: [phone]    Gastroenterology Consultation  Referring Provider:     [facility] Primary Care Physician:  [facility] Primary Gastroenterologist: [facility] Reason for Consultation:     Ulcerative colitis        HPI:   Stephanie Ayala is a 30 y.o. female referred by Dr. Shari Prows, [facility]  for consultation & management of ulcerative colitis.  Patient is diagnosed with ulcerative colitis in 2018.  Her fecal calprotectin levels were 370 at that time, elevated CRP 21.8 mg/L, ESR 42.  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  I do not have copy of entire procedure report.  Celiac panel was negative.  Stool studies were negative for infection.  Patient has been started on balsalazide 750 mg 3 pills daily, she was instructed to take 6 pills daily.  She was briefly on prednisone at the time of initial diagnosis.  Her fecal calprotectin level normalized, then increased to 126 in 2021.  Patient became pregnant in 2021, delivered in September 2021.  She reports that her pregnancy was overall uneventful except for intermittent loose stools and scant blood on wiping occasionally.  However, over the last 9 months, patient is experiencing lower abdominal cramps associated with significant rectal bleeding.  She is also currently breast-feeding.  She had iron deficiency anemia in her pregnancy.  Patient has been followed by [facility] gastroenterologist, she wanted to switch her care to me  Patient does not smoke or drink alcohol Patient reports pain in her knee joints, hands, denies any other extraintestinal manifestations  NSAIDs: None  Antiplts/Anticoagulants/Anti thrombotics: None  GI Procedures: Colonoscopy 2018, report not available Pathology revealed chronic  active colitis in cecum, chronic active proctitis and rectum  Past Medical History:  Diagnosis Date   Autoimmune disease ([place])    Colitis    Depression    Fibromyalgia     Past Surgical History:  Procedure Laterality Date   OVARY SURGERY Bilateral    cyst removal    Current Outpatient Medications:    acetaminophen (TYLENOL) 500 MG tablet, Take 500 mg by mouth every 6 (six) hours as needed., Disp: , Rfl:    balsalazide (COLAZAL) 750 MG capsule, Take 2,250 mg by mouth 3 (three) times daily., Disp: , Rfl:    DULoxetine (CYMBALTA) 30 MG capsule, Take 1 capsule (30 mg total) by mouth daily. Take along with 60 mg daily, Disp: 90 capsule, Rfl: 0   DULoxetine (CYMBALTA) 60 MG capsule, Take 1 capsule (60 mg total) by mouth daily., Disp: 90 capsule, Rfl: 0   Na Sulfate-K Sulfate-Mg Sulf 17.5-3.13-1.6 GM/177ML SOLN, Take 354 mLs by mouth once for 1 dose., Disp: 354 mL, Rfl: 0    Family History  Problem Relation Age of Onset   Anxiety disorder Mother    Diabetes Father    Hypertension Father    Diabetes Paternal Uncle    Heart disease Paternal Uncle    Alcohol abuse Maternal Grandfather    Heart disease Paternal Grandfather      Social History   Tobacco Use   Smoking status: Former    Packs/day: 0.00    Types: Cigarettes   Smokeless tobacco: Never  Vaping Use   Vaping Use: Some days  Substance Use Topics   Alcohol  use: Not Currently   Drug use: No    Allergies as of 11/20/2020   (No Known Allergies)    Review of Systems:    All systems reviewed and negative except where noted in HPI.   Physical Exam:  BP (!) 131/92 (BP Location: Left Arm, Patient Position: Sitting, Cuff Size: Normal)   Pulse (!) 101   Temp 98.4 F (36.9 C) (Oral)   Ht 5' 2"  (1.575 m)   Wt 196 lb 4 oz (89 kg)   BMI 35.89 kg/m  No LMP recorded.  General:   Alert,  Well-developed, well-nourished, pleasant and cooperative in NAD Head:  Normocephalic and atraumatic. Eyes:  Sclera clear, no  icterus.   Conjunctiva pink. Ears:  Normal auditory acuity. Nose:  No deformity, discharge, or lesions. Mouth:  No deformity or lesions,oropharynx pink & moist. Neck:  Supple; no masses or thyromegaly. Lungs:  Respirations even and unlabored.  Clear throughout to auscultation.   No wheezes, crackles, or rhonchi. No acute distress. Heart:  Regular rate and rhythm; no murmurs, clicks, rubs, or gallops. Abdomen:  Normal bowel sounds. Soft, non-tender and non-distended without masses, hepatosplenomegaly or hernias noted.  No guarding or rebound tenderness.   Rectal: Not performed Msk:  Symmetrical without gross deformities. Good, equal movement & strength bilaterally. Pulses:  Normal pulses noted. Extremities:  No clubbing or edema.  No cyanosis. Neurologic:  Alert and oriented x3;  grossly normal neurologically. Skin:  Intact without significant lesions or rashes. No jaundice. Psych:  Alert and cooperative. Normal mood and affect.  Imaging Studies: None  Assessment and Plan:   [name] is a 30 y.o. pleasant Caucasian female with history of ulcerative colitis, maintained on balsalazide 750 mg 3 times daily is seen in consultation for 9 months history of exacerbation of ulcerative colitis  Recommend GI profile PCR, calprotectin levels, CRP, CBC, CMP, iron panel, B12 and folate levels, vitamin D levels Check TPMT levels Hep C antibody negative Recommend to increase balsalazide to 750 mg 2 capsules 3 times a day Will start prednisone if needed after ruling out infection and based on lab results Recommend colonoscopy with TI evaluation to assess severity of inflammation and determine if patient needs stepup therapy  I have discussed alternative options, risks & benefits,  which include, but are not limited to, bleeding, infection, perforation,respiratory complication & drug reaction.  The patient agrees with this plan & written consent will be obtained.     Follow up in 2  months   Cephas Darby, MD

## 2020-11-21 ENCOUNTER — Other Ambulatory Visit: Payer: Self-pay | Admitting: *Deleted

## 2020-11-21 ENCOUNTER — Telehealth: Payer: Self-pay

## 2020-11-21 DIAGNOSIS — D500 Iron deficiency anemia secondary to blood loss (chronic): Secondary | ICD-10-CM

## 2020-11-21 MED ORDER — PREDNISONE 20 MG PO TABS: 40.0000 mg | ORAL_TABLET | Freq: Every day | ORAL | 0 refills | Status: AC

## 2020-11-21 NOTE — Telephone Encounter (Signed)
Did a urgent referral to [facility]. Sent prednisone to the pharmacy. Called and left a message for call back

## 2020-11-21 NOTE — Telephone Encounter (Signed)
-----   Message from Lin Landsman, MD sent at 11/21/2020  9:37 AM EDT ----- She has severe anemia, Hb 6.9 Can you call cancer center to arrange for blood transfusion, she will need 1 unit of pRBCs  Also, start her on prednisone 71m daily, 4 weeks  RV

## 2020-11-21 NOTE — Telephone Encounter (Signed)
Called and left a message for call back. Called cancer center and left a message for the new patient scheduler to give me a call about making patient a appointment.

## 2020-11-21 NOTE — Telephone Encounter (Signed)
Patient verablized understanding. She states she has been having dizzy spells and weakness but she thought it was from being fatigue. Informed patient after the infusion and with the prednisone she should feel better. She has appointment with Hematology tomorrow morning

## 2020-11-22 ENCOUNTER — Inpatient Hospital Stay: Payer: 59

## 2020-11-22 ENCOUNTER — Inpatient Hospital Stay: Payer: 59 | Attending: Internal Medicine | Admitting: Internal Medicine

## 2020-11-22 ENCOUNTER — Encounter: Payer: Self-pay | Admitting: Internal Medicine

## 2020-11-22 ENCOUNTER — Other Ambulatory Visit: Payer: Self-pay

## 2020-11-22 DIAGNOSIS — D500 Iron deficiency anemia secondary to blood loss (chronic): Secondary | ICD-10-CM

## 2020-11-22 DIAGNOSIS — K922 Gastrointestinal hemorrhage, unspecified: Secondary | ICD-10-CM | POA: Diagnosis not present

## 2020-11-22 DIAGNOSIS — Z79899 Other long term (current) drug therapy: Secondary | ICD-10-CM | POA: Diagnosis not present

## 2020-11-22 LAB — CBC WITH DIFFERENTIAL/PLATELET
Abs Immature Granulocytes: 0.03 10*3/uL (ref 0.00–0.07)
Abs Immature Granulocytes: 0.03 10*3/uL (ref 0.00–0.07)
Basophils Absolute: 0.1 10*3/uL (ref 0.0–0.1)
Basophils Absolute: 0.1 10*3/uL (ref 0.0–0.1)
Basophils Relative: 1 %
Basophils Relative: 1 %
Eosinophils Absolute: 0.1 10*3/uL (ref 0.0–0.5)
Eosinophils Absolute: 0.1 10*3/uL (ref 0.0–0.5)
Eosinophils Relative: 1 %
Eosinophils Relative: 1 %
HCT: 42.9 % (ref 36.0–46.0)
HCT: 43.1 % (ref 36.0–46.0)
Hemoglobin: 13.8 g/dL (ref 12.0–15.0)
Hemoglobin: 14 g/dL (ref 12.0–15.0)
Immature Granulocytes: 0 %
Immature Granulocytes: 0 %
Lymphocytes Relative: 34 %
Lymphocytes Relative: 28 %
Lymphs Abs: 3 10*3/uL (ref 0.7–4.0)
Lymphs Abs: 2.3 10*3/uL (ref 0.7–4.0)
MCH: 29.2 pg (ref 26.0–34.0)
MCH: 29.2 pg (ref 26.0–34.0)
MCHC: 32.2 g/dL (ref 30.0–36.0)
MCHC: 32.5 g/dL (ref 30.0–36.0)
MCV: 90.7 fL (ref 80.0–100.0)
MCV: 89.8 fL (ref 80.0–100.0)
Monocytes Absolute: 0.8 10*3/uL (ref 0.1–1.0)
Monocytes Absolute: 0.6 10*3/uL (ref 0.1–1.0)
Monocytes Relative: 9 %
Monocytes Relative: 7 %
Neutro Abs: 4.9 10*3/uL (ref 1.7–7.7)
Neutro Abs: 5.4 10*3/uL (ref 1.7–7.7)
Neutrophils Relative %: 55 %
Neutrophils Relative %: 63 %
Platelets: 416 10*3/uL — ABNORMAL HIGH (ref 150–400)
Platelets: 390 10*3/uL (ref 150–400)
RBC: 4.73 MIL/uL (ref 3.87–5.11)
RBC: 4.8 MIL/uL (ref 3.87–5.11)
RDW: 12.7 % (ref 11.5–15.5)
RDW: 12.8 % (ref 11.5–15.5)
WBC: 8.8 10*3/uL (ref 4.0–10.5)
WBC: 8.4 10*3/uL (ref 4.0–10.5)
nRBC: 0 % (ref 0.0–0.2)
nRBC: 0 % (ref 0.0–0.2)

## 2020-11-22 LAB — SAMPLE TO BLOOD BANK

## 2020-11-22 LAB — VITAMIN B12: Vitamin B-12: 289 pg/mL (ref 180–914)

## 2020-11-22 NOTE — Assessment & Plan Note (Addendum)
#  History of anemia-likely secondary to GI bleed [IBD]-see below.  Recent hemoglobin 9.8; repeat hemoglobin today 13-14 [3 different specimens].  #Suspect earlier patient's hemoglobin lab artifact.  Hold off any blood transfusion/iron transfusion.  #IBD-awaiting colonoscopy with Dr. Marius Ditch.  Thank you, Vanga for allowing me to participate in the care of your pleasant patient. Please do not hesitate to contact me with questions or concerns in the interim.  Discussed with Dr. Marius Ditch.   DISPOSITION: # follow up as needed-Dr.B

## 2020-11-22 NOTE — Progress Notes (Signed)
Mentioned dizzy spells, weakness, very fatigue, joint pain, SOB and hot flashes x1 month.

## 2020-11-22 NOTE — Progress Notes (Signed)
[facility] CONSULT NOTE  Patient Care Team: [place], [facility] as PCP - General  CHIEF COMPLAINTS/PURPOSE OF CONSULTATION: ANEMIA   HEMATOLOGY HISTORY:  # ULCERATIVE COLITIS [?2017-previous colonoscopy; awaiting on 8/30; Dr.Vanga]  HISTORY OF PRESENTING ILLNESS:  Stephanie Ayala 30 y.o.  female with a history of ulcerative colitis has been referred to [place] for further evaluation/work-up for anemia.  Blood in stools: yes [every time using bathroom/mucus] Change in bowel habits- yes/diarrhea Blood in urine: None Difficulty swallowing: None Abnormal weight loss: None Iron supplementation: None currently Prior Blood transfusions: NONE Bariatric surgery: None Vaginal bleeding:not menstruating since delivery [Sep, 2022]  Review of Systems  Constitutional:  Positive for malaise/fatigue. Negative for chills, diaphoresis, fever and weight loss.  HENT:  Negative for nosebleeds and sore throat.   Eyes:  Negative for double vision.  Respiratory:  Positive for shortness of breath. Negative for cough, hemoptysis, sputum production and wheezing.   Cardiovascular:  Negative for chest pain, palpitations, orthopnea and leg swelling.  Gastrointestinal:  Negative for abdominal pain, blood in stool, constipation, diarrhea, heartburn, melena, nausea and vomiting.  Genitourinary:  Negative for dysuria, frequency and urgency.  Musculoskeletal:  Positive for joint pain and myalgias. Negative for back pain.  Skin: Negative.  Negative for itching and rash.  Neurological:  Positive for dizziness and weakness. Negative for tingling, focal weakness and headaches.  Endo/Heme/Allergies:  Does not bruise/bleed easily.  Psychiatric/Behavioral:  Negative for depression. The patient is not nervous/anxious and does not have insomnia.    MEDICAL HISTORY:  Past Medical History:  Diagnosis Date   Autoimmune disease ([place])    Colitis    Depression    Fibromyalgia     SURGICAL HISTORY: Past  Surgical History:  Procedure Laterality Date   OVARY SURGERY Bilateral    cyst removal    SOCIAL HISTORY: Social History   Socioeconomic History   Marital status: Married    Spouse name:  Information systems manager   Number of children: Not on file   Years of education: Not on file   Highest education level: Not on file  Occupational History   Occupation: Not employed  Tobacco Use   Smoking status: Former    Packs/day: 0.00    Types: Cigarettes   Smokeless tobacco: Never  Vaping Use   Vaping Use: Some days  Substance and Sexual Activity   Alcohol use: Not Currently   Drug use: No   Sexual activity: Yes    Birth control/protection: I.U.D.  Other Topics Concern   Not on file  Social History Narrative   Lives in [place]; with daughter/room-mate. No smoking; no alcohol. On retirement disability.    Social Determinants of Health   Financial Resource Strain: Not on file  Food Insecurity: Not on file  Transportation Needs: Not on file  Physical Activity: Not on file  Stress: Not on file  Social Connections: Not on file  Intimate Partner Violence: Not on file    FAMILY HISTORY: Family History  Problem Relation Age of Onset   Anxiety disorder Mother    Diabetes Father    Hypertension Father    Diabetes Paternal Uncle    Heart disease Paternal Uncle    Alcohol abuse Maternal Grandfather    Heart disease Paternal Grandfather     ALLERGIES:  has No Known Allergies.  MEDICATIONS:  Current Outpatient Medications  Medication Sig Dispense Refill   acetaminophen (TYLENOL) 500 MG tablet Take 500 mg by mouth every 6 (six) hours as needed.  balsalazide (COLAZAL) 750 MG capsule Take 2,250 mg by mouth 3 (three) times daily.     DULoxetine (CYMBALTA) 30 MG capsule Take 1 capsule (30 mg total) by mouth daily. Take along with 60 mg daily 90 capsule 0   DULoxetine (CYMBALTA) 60 MG capsule Take 1 capsule (60 mg total) by mouth daily. 90 capsule 0   predniSONE (DELTASONE) 20 MG tablet Take 2  tablets (40 mg total) by mouth daily with breakfast. 60 tablet 0   No current facility-administered medications for this visit.      PHYSICAL EXAMINATION:   Vitals:   11/22/20 0850  BP: 102/89  Pulse: 95  Resp: 16  Temp: 97.8 F (36.6 C)  SpO2: 100%   Filed Weights   11/22/20 0850  Weight: 195 lb 3.2 oz (88.5 kg)    Physical Exam Vitals and nursing note reviewed.  HENT:     Head: Normocephalic and atraumatic.     Mouth/Throat:     Pharynx: Oropharynx is clear.  Eyes:     Extraocular Movements: Extraocular movements intact.     Pupils: Pupils are equal, round, and reactive to light.  Cardiovascular:     Rate and Rhythm: Normal rate and regular rhythm.  Pulmonary:     Comments: Decreased breath sounds bilaterally.  Abdominal:     Palpations: Abdomen is soft.  Musculoskeletal:        General: Normal range of motion.     Cervical back: Normal range of motion.  Skin:    General: Skin is warm.  Neurological:     General: No focal deficit present.     Mental Status: She is alert and oriented to person, place, and time.  Psychiatric:        Behavior: Behavior normal.        Judgment: Judgment normal.    LABORATORY DATA:  I have reviewed the data as listed Lab Results  Component Value Date   WBC 8.4 11/22/2020   HGB 14.0 11/22/2020   HCT 43.1 11/22/2020   MCV 89.8 11/22/2020   PLT 390 11/22/2020   Recent Labs    11/20/20 1536  NA 140  K 5.1  CL 102  CO2 19*  GLUCOSE 92  BUN 8  CREATININE 0.65  CALCIUM 9.9  PROT 7.4  ALBUMIN 5.0  AST 47*  ALT 55*  ALKPHOS 134*  BILITOT 0.5     No results found.  Iron deficiency anemia due to chronic blood loss #History of anemia-likely secondary to GI bleed [IBD]-see below.  Recent hemoglobin 9.8; repeat hemoglobin today 13-14 [3 different specimens].  #Suspect earlier patient's hemoglobin lab artifact.  Hold off any blood transfusion/iron transfusion.  #IBD-awaiting colonoscopy with Dr. Marius Ditch.  Thank  you, Vanga for allowing me to participate in the care of your pleasant patient. Please do not hesitate to contact me with questions or concerns in the interim.  Discussed with Dr. Marius Ditch.   DISPOSITION: # follow up as needed-Dr.B     All questions were answered. The patient knows to call the clinic with any problems, questions or concerns.      Cammie Sickle, MD 11/24/2020 8:51 PM

## 2020-11-25 ENCOUNTER — Encounter: Payer: Self-pay | Admitting: Gastroenterology

## 2020-11-26 ENCOUNTER — Ambulatory Visit: Payer: Medicaid Other | Admitting: Anesthesiology

## 2020-11-26 ENCOUNTER — Ambulatory Visit
Admission: RE | Admit: 2020-11-26 | Discharge: 2020-11-26 | Disposition: A | Discharge status: Home / Self Care | Payer: Medicaid Other | Attending: Gastroenterology | Admitting: Gastroenterology

## 2020-11-26 ENCOUNTER — Encounter
Admission: RE | Disposition: A | Discharge status: Home / Self Care | Payer: Self-pay | Source: Home / Self Care | Attending: Gastroenterology

## 2020-11-26 DIAGNOSIS — Z79899 Other long term (current) drug therapy: Secondary | ICD-10-CM | POA: Insufficient documentation

## 2020-11-26 DIAGNOSIS — D124 Benign neoplasm of descending colon: Secondary | ICD-10-CM | POA: Insufficient documentation

## 2020-11-26 DIAGNOSIS — K5120 Ulcerative (chronic) proctitis without complications: Secondary | ICD-10-CM | POA: Insufficient documentation

## 2020-11-26 DIAGNOSIS — K635 Polyp of colon: Secondary | ICD-10-CM | POA: Diagnosis not present

## 2020-11-26 DIAGNOSIS — K51011 Ulcerative (chronic) pancolitis with rectal bleeding: Secondary | ICD-10-CM

## 2020-11-26 DIAGNOSIS — Z87891 Personal history of nicotine dependence: Secondary | ICD-10-CM | POA: Insufficient documentation

## 2020-11-26 HISTORY — PX: COLONOSCOPY WITH PROPOFOL: SHX5780

## 2020-11-26 LAB — POCT PREGNANCY, URINE: Preg Test, Ur: NEGATIVE

## 2020-11-26 SURGERY — COLONOSCOPY WITH PROPOFOL
Anesthesia: General

## 2020-11-26 MED ORDER — PROPOFOL 10 MG/ML IV BOLUS
INTRAVENOUS | Status: DC | PRN
  Administered 2020-11-26 (×2): 50 mg via INTRAVENOUS
  Administered 2020-11-26: 20 mg via INTRAVENOUS
  Administered 2020-11-26: 80 mg via INTRAVENOUS

## 2020-11-26 MED ORDER — PROPOFOL 500 MG/50ML IV EMUL
INTRAVENOUS | Status: DC | PRN
  Administered 2020-11-26: 200 ug/kg/min via INTRAVENOUS

## 2020-11-26 MED ORDER — PROPOFOL 500 MG/50ML IV EMUL
INTRAVENOUS | Status: AC
  Filled 2020-11-26: qty 50

## 2020-11-26 MED ORDER — LIDOCAINE 2% (20 MG/ML) 5 ML SYRINGE
INTRAMUSCULAR | Status: DC | PRN
  Administered 2020-11-26: 50 mg via INTRAVENOUS

## 2020-11-26 MED ORDER — SODIUM CHLORIDE 0.9 % IV SOLN
INTRAVENOUS | Status: DC
  Administered 2020-11-26: 20 mL/h via INTRAVENOUS

## 2020-11-26 MED ORDER — DEXMEDETOMIDINE (PRECEDEX) IN NS 20 MCG/5ML (4 MCG/ML) IV SYRINGE
PREFILLED_SYRINGE | INTRAVENOUS | Status: DC | PRN
  Administered 2020-11-26 (×2): 8 ug via INTRAVENOUS

## 2020-11-26 MED ORDER — DEXMEDETOMIDINE HCL IN NACL 200 MCG/50ML IV SOLN
INTRAVENOUS | Status: AC
  Filled 2020-11-26: qty 50

## 2020-11-26 MED ORDER — PHENYLEPHRINE HCL (PRESSORS) 10 MG/ML IV SOLN
INTRAVENOUS | Status: DC | PRN
  Administered 2020-11-26: 100 ug via INTRAVENOUS

## 2020-11-26 NOTE — H&P (Signed)
Cephas Darby, MD [address]  [place], [place] [address]  Main: [phone]  Fax: [phone] Pager: [phone]  Primary Care Physician:  [facility] Primary Gastroenterologist:  Dr. Cephas Darby  Pre-Procedure History & Physical: HPI:  Stephanie Ayala is a 30 y.o. female is here for an colonoscopy.   Past Medical History:  Diagnosis Date   Autoimmune disease ([place])    Colitis    Depression    Fibromyalgia     Past Surgical History:  Procedure Laterality Date   OVARY SURGERY Bilateral    cyst removal    Prior to Admission medications   Medication Sig Start Date End Date Taking? Authorizing Provider  acetaminophen (TYLENOL) 500 MG tablet Take 500 mg by mouth every 6 (six) hours as needed.   Yes [provider]  balsalazide (COLAZAL) 750 MG capsule Take 2,250 mg by mouth 3 (three) times daily.   Yes [provider]  DULoxetine (CYMBALTA) 30 MG capsule Take 1 capsule (30 mg total) by mouth daily. Take along with 60 mg daily 10/23/20  Yes Eappen, Ria Clock, MD  DULoxetine (CYMBALTA) 60 MG capsule Take 1 capsule (60 mg total) by mouth daily. 09/24/20  Yes Orlene Erm, MD  predniSONE (DELTASONE) 20 MG tablet Take 2 tablets (40 mg total) by mouth daily with breakfast. 11/21/20 12/21/20  Lin Landsman, MD    Allergies as of 11/20/2020   (No Known Allergies)    Family History  Problem Relation Age of Onset   Anxiety disorder Mother    Diabetes Father    Hypertension Father    Diabetes Paternal Uncle    Heart disease Paternal Uncle    Alcohol abuse Maternal Grandfather    Heart disease Paternal Grandfather     Social History   Socioeconomic History   Marital status: Married    Spouse name:  Information systems manager   Number of children: Not on file   Years of education: Not on file   Highest education level: Not on file  Occupational History   Occupation: Not employed  Tobacco Use   Smoking status: Former    Packs/day:  0.00    Types: Cigarettes   Smokeless tobacco: Never  Vaping Use   Vaping Use: Some days  Substance and Sexual Activity   Alcohol use: Not Currently   Drug use: No   Sexual activity: Yes    Birth control/protection: I.U.D.  Other Topics Concern   Not on file  Social History Narrative   Lives in [place]; with daughter/room-mate. No smoking; no alcohol. On retirement disability.    Social Determinants of Health   Financial Resource Strain: Not on file  Food Insecurity: Not on file  Transportation Needs: Not on file  Physical Activity: Not on file  Stress: Not on file  Social Connections: Not on file  Intimate Partner Violence: Not on file    Review of Systems: See HPI, otherwise negative ROS  Physical Exam: BP 112/83   Pulse (!) 111   Temp 98.2 F (36.8 C) (Temporal)   Resp 20   Ht 5' 2"  (1.575 m)   Wt 88 kg   SpO2 98%   BMI 35.48 kg/m  General:   Alert,  pleasant and cooperative in NAD Head:  Normocephalic and atraumatic. Neck:  Supple; no masses or thyromegaly. Lungs:  Clear throughout to auscultation.    Heart:  Regular rate and rhythm. Abdomen:  Soft, nontender and nondistended. Normal bowel sounds, without guarding,  and without rebound.   Neurologic:  Alert and  oriented x4;  grossly normal neurologically.  Impression/Plan: [name] is here for an colonoscopy to be performed for h/o ulcerative colitis  Risks, benefits, limitations, and alternatives regarding  colonoscopy have been reviewed with the patient.  Questions have been answered.  All parties agreeable.   Sherri Sear, MD  11/26/2020, 9:38 AM

## 2020-11-26 NOTE — Transfer of Care (Signed)
Immediate Anesthesia Transfer of Care Note  Patient: Stephanie Ayala  Procedure(s) Performed: COLONOSCOPY WITH PROPOFOL  Patient Location: Endoscopy Unit  Anesthesia Type:General  Level of Consciousness: awake  Airway & Oxygen Therapy: Patient Spontanous Breathing  Post-op Assessment: Report given to RN and Post -op Vital signs reviewed and stable  Post vital signs: Reviewed and stable  Last Vitals:  Vitals Value Taken Time  BP 92/56 11/26/20 1023  Temp 36.3 C 11/26/20 1021  Pulse 87 11/26/20 1027  Resp 20 11/26/20 1027  SpO2 99 % 11/26/20 1027  Vitals shown include unvalidated device data.  Last Pain:  Vitals:   11/26/20 1021  TempSrc: Temporal  PainSc: 0-No pain         Complications: No notable events documented.

## 2020-11-26 NOTE — Anesthesia Preprocedure Evaluation (Signed)
Anesthesia Evaluation  Patient identified by MRN, date of birth, ID band Patient awake    Reviewed: Allergy & Precautions, H&P , NPO status , Patient's Chart, lab work & pertinent test results, reviewed documented beta blocker date and time   Airway Mallampati: II   Neck ROM: full    Dental  (+) Poor Dentition   Pulmonary pneumonia, resolved, former smoker,    Pulmonary exam normal        Cardiovascular Exercise Tolerance: Good negative cardio ROS Normal cardiovascular exam Rhythm:regular Rate:Normal     Neuro/Psych PSYCHIATRIC DISORDERS Anxiety Depression  Neuromuscular disease    GI/Hepatic Neg liver ROS, PUD,   Endo/Other  negative endocrine ROS  Renal/GU negative Renal ROS  negative genitourinary   Musculoskeletal   Abdominal   Peds  Hematology  (+) Blood dyscrasia, anemia ,   Anesthesia Other Findings Past Medical History: No date: Autoimmune disease ([place]) No date: Colitis No date: Depression No date: Fibromyalgia Past Surgical History: No date: OVARY SURGERY; Bilateral     Comment:  cyst removal BMI    Body Mass Index: 35.48 kg/m     Reproductive/Obstetrics negative OB ROS                             Anesthesia Physical Anesthesia Plan  ASA: 2  Anesthesia Plan: General   Post-op Pain Management:    Induction:   PONV Risk Score and Plan:   Airway Management Planned:   Additional Equipment:   Intra-op Plan:   Post-operative Plan:   Informed Consent: I have reviewed the patients History and Physical, chart, labs and discussed the procedure including the risks, benefits and alternatives for the proposed anesthesia with the patient or authorized representative who has indicated his/her understanding and acceptance.     Dental Advisory Given  Plan Discussed with: CRNA  Anesthesia Plan Comments:         Anesthesia Quick Evaluation

## 2020-11-26 NOTE — Op Note (Signed)
[facility] Gastroenterology Patient Name: Stephanie Ayala Procedure Date: 11/26/2020 9:41 AM MRN: [id number] Account #: [id number] Date of Birth: 13-Jul-1990 Admit Type: Outpatient Age: 30 Room: [facility] ENDO ROOM 2 Gender: Female Note Status: Finalized Procedure:             Colonoscopy Indications:           Follow-up of left-sided chronic ulcerative colitis,                         Disease activity assessment of left-sided chronic                         ulcerative colitis, Assess therapeutic response to                         therapy of left-sided chronic ulcerative colitis Providers:             Lin Landsman MD, MD Medicines:             General Anesthesia Complications:         No immediate complications. Estimated blood loss: None. Procedure:             Pre-Anesthesia Assessment:                        - Prior to the procedure, a History and Physical was                         performed, and patient medications and allergies were                         reviewed. The patient is competent. The risks and                         benefits of the procedure and the sedation options and                         risks were discussed with the patient. All questions                         were answered and informed consent was obtained.                         Patient identification and proposed procedure were                         verified by the physician, the nurse, the                         anesthesiologist, the anesthetist and the technician                         in the pre-procedure area in the procedure room in the                         endoscopy suite. Mental Status Examination: alert and                         oriented. Airway Examination: normal oropharyngeal  airway and neck mobility. Respiratory Examination:                         clear to auscultation. CV Examination: normal.                         Prophylactic  Antibiotics: The patient does not require                         prophylactic antibiotics. Prior Anticoagulants: The                         patient has taken no previous anticoagulant or                         antiplatelet agents. ASA Grade Assessment: II - A                         patient with mild systemic disease. After reviewing                         the risks and benefits, the patient was deemed in                         satisfactory condition to undergo the procedure. The                         anesthesia plan was to use general anesthesia.                         Immediately prior to administration of medications,                         the patient was re-assessed for adequacy to receive                         sedatives. The heart rate, respiratory rate, oxygen                         saturations, blood pressure, adequacy of pulmonary                         ventilation, and response to care were monitored                         throughout the procedure. The physical status of the                         patient was re-assessed after the procedure.                        After obtaining informed consent, the colonoscope was                         passed under direct vision. Throughout the procedure,                         the patient's blood pressure, pulse, and oxygen  saturations were monitored continuously. The                         Colonoscope was introduced through the anus and                         advanced to the the terminal ileum, with                         identification of the appendiceal orifice and IC                         valve. The colonoscopy was performed without                         difficulty. The patient tolerated the procedure well.                         The quality of the bowel preparation was evaluated                         using the BBPS [facility] Bowel Preparation Scale) with                         scores of:  Right Colon = 3, Transverse Colon = 3 and                         Left Colon = 3 (entire mucosa seen well with no                         residual staining, small fragments of stool or opaque                         liquid). The total BBPS score equals 9. Findings:      The perianal and digital rectal examinations were normal. Pertinent       negatives include normal sphincter tone and no palpable rectal lesions.      The terminal ileum appeared normal. Biopsies were taken with a cold       forceps for histology.      Localized moderate inflammation characterized by congestion (edema),       erythema and friability was found in the cecum. Biopsies were taken with       a cold forceps for histology.      Normal mucosa was found in the recto-sigmoid colon, in the sigmoid       colon, in the descending colon, in the transverse colon and in the       ascending colon. Biopsies were taken with a cold forceps for histology.      Diffuse moderate inflammation characterized by altered vascularity,       congestion (edema), erythema, friability and mucus was found in the       distal rectum. Biopsies were taken with a cold forceps for histology.      A 5 mm polyp was found in the descending colon. The polyp was sessile.       The polyp was removed with a cold snare. Resection and retrieval were       complete. Impression:            -  The examined portion of the ileum was normal.                         Biopsied.                        - Localized moderate inflammation was found in the                         cecum secondary to colitis. Biopsied.                        - Normal mucosa in the recto-sigmoid colon, in the                         sigmoid colon, in the descending colon, in the                         transverse colon and in the ascending colon. Biopsied.                        - Diffuse moderate inflammation was found in the                         distal rectum secondary to proctitis  ulcerative                         colitis. Biopsied.                        - One 5 mm polyp in the descending colon, removed with                         a cold snare. Resected and retrieved. Recommendation:        - Await pathology results.                        - Discharge patient to home (with escort).                        - Resume previous diet today.                        - Return to my office as previously scheduled. 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 All rights reserved. The codes documented in this report are preliminary and upon coder review may  be revised to meet current compliance requirements. Dr. Ulyess Mort Lin Landsman MD, MD 11/26/2020 10:20:32 AM This report has been signed electronically. Number of Addenda: 0 Note Initiated On: 11/26/2020 9:41 AM Scope Withdrawal Time: 0 hours 15 minutes 18 seconds  Total Procedure Duration: 0 hours 19 minutes 20 seconds  Estimated Blood Loss:  Estimated blood loss: none. Estimated blood loss: none.      [facility]

## 2020-11-26 NOTE — Anesthesia Postprocedure Evaluation (Signed)
Anesthesia Post Note  Patient: Stephanie Ayala  Procedure(s) Performed: COLONOSCOPY WITH PROPOFOL  Patient location during evaluation: PACU Anesthesia Type: General Level of consciousness: awake and alert Pain management: pain level controlled Vital Signs Assessment: post-procedure vital signs reviewed and stable Respiratory status: spontaneous breathing, nonlabored ventilation, respiratory function stable and patient connected to nasal cannula oxygen Cardiovascular status: blood pressure returned to baseline and stable Postop Assessment: no apparent nausea or vomiting Anesthetic complications: no   No notable events documented.   Last Vitals:  Vitals:   11/26/20 0856 11/26/20 1021  BP: 112/83 (!) 92/56  Pulse: (!) 111 88  Resp: 20 20  Temp: 36.8 C (!) 36.3 C  SpO2: 98% 100%    Last Pain:  Vitals:   11/26/20 1021  TempSrc: Temporal  PainSc: 0-No pain                 Molli Barrows

## 2020-11-27 ENCOUNTER — Encounter: Payer: Self-pay | Admitting: Gastroenterology

## 2020-11-27 ENCOUNTER — Telehealth: Payer: Self-pay

## 2020-11-27 LAB — SURGICAL PATHOLOGY

## 2020-11-27 NOTE — Telephone Encounter (Signed)
Patient verbalized understanding. Made appointment for 12/03/20

## 2020-11-27 NOTE — Telephone Encounter (Signed)
-----   Message from Lin Landsman, MD sent at 11/27/2020  3:02 PM EDT ----- Caryl Pina  Please inform patient that her pathology results confirm that she has moderately severe ulcerative colitis predominantly in the rectum.  I would like to see her in next 2 weeks to discuss treatment options. Video visit is okay, and okay to overbook.  Also, please remind her about the stool studies that we ordered.  [employer]

## 2020-11-27 NOTE — Telephone Encounter (Signed)
Called and left a message for call back  

## 2020-11-28 LAB — CBC
Hematocrit: 20.8 % — ABNORMAL LOW (ref 34.0–46.6)
Hemoglobin: 6.9 g/dL — CL (ref 11.1–15.9)
MCH: 29.2 pg (ref 26.6–33.0)
MCHC: 33.2 g/dL (ref 31.5–35.7)
MCV: 88 fL (ref 79–97)
Platelets: 144 10*3/uL — ABNORMAL LOW (ref 150–450)
RBC: 2.36 x10E6/uL — CL (ref 3.77–5.28)
RDW: 12.9 % (ref 11.7–15.4)
WBC: 3.3 10*3/uL — ABNORMAL LOW (ref 3.4–10.8)

## 2020-11-28 LAB — THIOPURINE METHYLTRANSFERASE (TPMT), RBC: TPMT Activity:: 16.4 Units/mL RBC

## 2020-11-28 LAB — COMPREHENSIVE METABOLIC PANEL
ALT: 55 IU/L — ABNORMAL HIGH (ref 0–32)
AST: 47 IU/L — ABNORMAL HIGH (ref 0–40)
Albumin/Globulin Ratio: 2.1 (ref 1.2–2.2)
Albumin: 5 g/dL (ref 3.9–5.0)
Alkaline Phosphatase: 134 IU/L — ABNORMAL HIGH (ref 44–121)
BUN/Creatinine Ratio: 12 (ref 9–23)
BUN: 8 mg/dL (ref 6–20)
Bilirubin Total: 0.5 mg/dL (ref 0.0–1.2)
CO2: 19 mmol/L — ABNORMAL LOW (ref 20–29)
Calcium: 9.9 mg/dL (ref 8.7–10.2)
Chloride: 102 mmol/L (ref 96–106)
Creatinine, Ser: 0.65 mg/dL (ref 0.57–1.00)
Globulin, Total: 2.4 g/dL (ref 1.5–4.5)
Glucose: 92 mg/dL (ref 65–99)
Potassium: 5.1 mmol/L (ref 3.5–5.2)
Sodium: 140 mmol/L (ref 134–144)
Total Protein: 7.4 g/dL (ref 6.0–8.5)
eGFR: 121 mL/min/{1.73_m2} (ref >59)

## 2020-11-28 LAB — C-REACTIVE PROTEIN: CRP: 18 mg/L — ABNORMAL HIGH (ref 0–10)

## 2020-11-28 LAB — IRON,TIBC AND FERRITIN PANEL
Ferritin: 49 ng/mL (ref 15–150)
Iron Saturation: 22 % (ref 15–55)
Iron: 66 ug/dL (ref 27–159)
Total Iron Binding Capacity: 301 ug/dL (ref 250–450)
UIBC: 235 ug/dL (ref 131–425)

## 2020-11-28 LAB — B12 AND FOLATE PANEL
Folate: 14.2 ng/mL (ref >3.0)
Vitamin B-12: 525 pg/mL (ref 232–1245)

## 2020-11-28 LAB — VITAMIN D 25 HYDROXY (VIT D DEFICIENCY, FRACTURES): Vit D, 25-Hydroxy: 20.4 ng/mL — ABNORMAL LOW (ref 30.0–100.0)

## 2020-12-03 ENCOUNTER — Encounter: Payer: Self-pay | Admitting: Gastroenterology

## 2020-12-03 ENCOUNTER — Ambulatory Visit (INDEPENDENT_AMBULATORY_CARE_PROVIDER_SITE_OTHER): Payer: 59 | Admitting: Gastroenterology

## 2020-12-03 ENCOUNTER — Other Ambulatory Visit: Payer: Self-pay

## 2020-12-03 VITALS — BP 126/84 | HR 102 | Temp 98.8°F | Ht 62.0 in | Wt 197.2 lb

## 2020-12-03 DIAGNOSIS — R7989 Other specified abnormal findings of blood chemistry: Secondary | ICD-10-CM | POA: Diagnosis not present

## 2020-12-03 DIAGNOSIS — K51911 Ulcerative colitis, unspecified with rectal bleeding: Secondary | ICD-10-CM

## 2020-12-03 NOTE — Patient Instructions (Signed)
Your Ultrasound is scheduled for 12/12/2020 9:30am please arrived at 9:15am at the [facility] mall. Nothing to eat or drink after midnight.

## 2020-12-03 NOTE — Progress Notes (Signed)
Cephas Darby, MD [address]  [place], [place] [address]  Main: [phone]  Fax: [phone]    Gastroenterology Consultation  Referring Provider:     [facility] Primary Care Physician:  [facility] Primary Gastroenterologist: [facility] Reason for Consultation:     Ulcerative colitis        HPI:   Stephanie Ayala is a 30 y.o. female referred by Dr. Shari Prows, [facility]  for consultation & management of ulcerative colitis.  Patient is diagnosed with ulcerative colitis in 2018.  Her fecal calprotectin levels were 370 at that time, elevated CRP 21.8 mg/L, ESR 42.  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  I do not have copy of entire procedure report.  Celiac panel was negative.  Stool studies were negative for infection.  Patient has been started on balsalazide 750 mg 3 pills daily, she was instructed to take 6 pills daily.  She was briefly on prednisone at the time of initial diagnosis.  Her fecal calprotectin level normalized, then increased to 126 in 2021.  Patient became pregnant in 2021, delivered in September 2021.  She reports that her pregnancy was overall uneventful except for intermittent loose stools and scant blood on wiping occasionally.  However, over the last 9 months, patient is experiencing lower abdominal cramps associated with significant rectal bleeding.  She is also currently breast-feeding.  She had iron deficiency anemia in her pregnancy.  Patient has been followed by [facility] [place] [facility] gastroenterologist, she wanted to switch her care to me  Follow-up visit 12/03/2020 Patient is here for follow-up of ulcerative colitis with recent flareup.  She underwent colonoscopy which revealed moderate active chronic colitis in the cecum and rectum and inactive colitis in the rest of the colon, CRP is mildly elevated to 1.8 mg/dL.  Her LFTs  are also elevated.  Patient is here to discuss next steps.  Patient did not tolerate prednisone, developed mood swings and was getting irritated at her daughter.  Therefore, discontinued it She is currently taking balsalazide 750 mg 6 pills daily  Patient does not smoke or drink alcohol Patient reports pain in her knee joints, hands, denies any other extraintestinal manifestations  NSAIDs: None  Antiplts/Anticoagulants/Anti thrombotics: None  GI Procedures: Colonoscopy 2018, report not available Pathology revealed chronic active colitis in cecum, chronic active proctitis and rectum  Colonoscopy 11/26/2020 - The examined portion of the ileum was normal. Biopsied. - Localized moderate inflammation was found in the cecum secondary to colitis. Biopsied. - Normal mucosa in the recto-sigmoid colon, in the sigmoid colon, in the descending colon, in the transverse colon and in the ascending colon. Biopsied. - Diffuse moderate inflammation was found in the distal rectum secondary to proctitis ulcerative colitis. Biopsied. - One 5 mm polyp in the descending colon, removed with a cold snare. Resected and retrieved.  DIAGNOSIS:  A. TERMINAL ILEUM, RANDOM; COLD BIOPSY:  - ENTERIC MUCOSA WITH NORMAL VILLOUS ARCHITECTURE AND REACTIVE LYMPHOID  HYPERPLASIA.  - NEGATIVE FOR ACTIVE ILEITIS.  - NEGATIVE FOR GRANULOMA, DYSPLASIA, AND MALIGNANCY.   B. COLON, RANDOM CECUM; COLD BIOPSY:  - CHRONIC COLITIS WITH MILD TO MODERATE ACTIVITY (CRYPTITIS AND FOCAL  CRYPT DISRUPTION).  - NEGATIVE FOR GRANULOMA, DYSPLASIA, AND MALIGNANCY.   C. COLON, RANDOM ASCENDING; COLD BIOPSY:  - CHRONIC COLITIS WITHOUT ACTIVITY.  - NEGATIVE FOR GRANULOMA, DYSPLASIA, AND MALIGNANCY.  D. COLON, RANDOM TRANSVERSE; COLD BIOPSY:  - CHRONIC COLITIS WITHOUT ACTIVITY.  - NEGATIVE FOR GRANULOMA, DYSPLASIA, AND MALIGNANCY.   E. COLON, RANDOM DESCENDING; COLD BIOPSY:  - CHRONIC COLITIS WITHOUT ACTIVITY.  - NEGATIVE FOR  GRANULOMA, DYSPLASIA, AND MALIGNANCY.   F. COLON POLYP, DESCENDING; COLD SNARE:  - SESSILE SERRATED POLYP.  - NEGATIVE FOR DYSPLASIA AND MALIGNANCY.   G. COLON, RANDOM SIGMOID; COLD BIOPSY:  - CHRONIC COLITIS WITHOUT ACTIVITY.  - NEGATIVE FOR GRANULOMA, DYSPLASIA, AND MALIGNANCY.   H. 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  - NEGATIVE FOR GRANULOMA, DYSPLASIA, AND MALIGNANCY.   Past Medical History:  Diagnosis Date   Autoimmune disease ([place])    Colitis    Depression    Fibromyalgia     Past Surgical History:  Procedure Laterality Date   COLONOSCOPY WITH PROPOFOL N/A 11/26/2020   Procedure: COLONOSCOPY WITH PROPOFOL;  Surgeon: Lin Landsman, MD;  Location: [facility] ENDOSCOPY;  Service: Gastroenterology;  Laterality: N/A;   OVARY SURGERY Bilateral    cyst removal    Current Outpatient Medications:    acetaminophen (TYLENOL) 500 MG tablet, Take 500 mg by mouth every 6 (six) hours as needed., Disp: , Rfl:    balsalazide (COLAZAL) 750 MG capsule, Take 2,250 mg by mouth 3 (three) times daily., Disp: , Rfl:    DULoxetine (CYMBALTA) 30 MG capsule, Take 1 capsule (30 mg total) by mouth daily. Take along with 60 mg daily, Disp: 90 capsule, Rfl: 0   DULoxetine (CYMBALTA) 60 MG capsule, Take 1 capsule (60 mg total) by mouth daily., Disp: 90 capsule, Rfl: 0   predniSONE (DELTASONE) 20 MG tablet, Take 2 tablets (40 mg total) by mouth daily with breakfast., Disp: 60 tablet, Rfl: 0    Family History  Problem Relation Age of Onset   Anxiety disorder Mother    Diabetes Father    Hypertension Father    Diabetes Paternal Uncle    Heart disease Paternal Uncle    Alcohol abuse Maternal Grandfather    Heart disease Paternal Grandfather      Social History   Tobacco Use   Smoking status: Former    Packs/day: 0.00    Types: Cigarettes   Smokeless tobacco: Never  Vaping Use   Vaping Use: Some days  Substance Use  Topics   Alcohol use: Not Currently   Drug use: No    Allergies as of 12/03/2020   (No Known Allergies)    Review of Systems:    All systems reviewed and negative except where noted in HPI.   Physical Exam:  BP 126/84 (BP Location: Left Arm, Patient Position: Sitting, Cuff Size: Normal)   Pulse (!) 102   Temp 98.8 F (37.1 C) (Oral)   Ht 5' 2"  (1.575 m)   Wt 197 lb 4 oz (89.5 kg)   BMI 36.08 kg/m  No LMP recorded.  General:   Alert,  Well-developed, well-nourished, pleasant and cooperative in NAD Head:  Normocephalic and atraumatic. Eyes:  Sclera clear, no icterus.   Conjunctiva pink. Ears:  Normal auditory acuity. Nose:  No deformity, discharge, or lesions. Mouth:  No deformity or lesions,oropharynx pink & moist. Neck:  Supple; no masses or thyromegaly. Lungs:  Respirations even and unlabored.  Clear throughout to auscultation.   No wheezes, crackles, or rhonchi. No acute distress. Heart:  Regular rate and rhythm; no murmurs, clicks, rubs, or gallops. Abdomen:  Normal bowel sounds. Soft, non-tender and non-distended  without masses, hepatosplenomegaly or hernias noted.  No guarding or rebound tenderness.   Rectal: Not performed Msk:  Symmetrical without gross deformities. Good, equal movement & strength bilaterally. Pulses:  Normal pulses noted. Extremities:  No clubbing or edema.  No cyanosis. Neurologic:  Alert and oriented x3;  grossly normal neurologically. Skin:  Intact without significant lesions or rashes. No jaundice. Psych:  Alert and cooperative. Normal mood and affect.  Imaging Studies: None  Assessment and Plan:   Stephanie Ayala is a 73 y.o. pleasant Caucasian female with history of ulcerative colitis, maintained on balsalazide 750 mg 3 times daily is seen in consultation for 9 months history of exacerbation of ulcerative colitis  Exhibition of ulcerative colitis Colonoscopy confirmed chronic moderately active colitis in the cecum as well as rectum and  inactive colitis in rest of the colon.  TI biopsies normal CRP elevated, pending stool studies Recommend GI profile PCR, calprotectin levels TPMT levels confirm homozygosity Continue balsalazide to 750 mg 2 capsules 3 times a day Patient did not tolerate prednisone Patient will probably need biologic therapy at this time due to extensive colitis However, she has elevated LFTs which need further work-up  Elevated LFTs Patient takes Tylenol 650 mg 2 pills daily for joint and body aches She denies alcohol use, denies any natural supplements Advised her to completely stop Tylenol Recheck LFTs today Check celiac panel, recommend ultrasound liver Repeat viral hepatitis panel   Follow up in 3-4 months   Cephas Darby, MD

## 2020-12-04 ENCOUNTER — Encounter: Payer: Self-pay | Admitting: Psychiatry

## 2020-12-04 ENCOUNTER — Telehealth (INDEPENDENT_AMBULATORY_CARE_PROVIDER_SITE_OTHER): Payer: 59 | Admitting: Psychiatry

## 2020-12-04 DIAGNOSIS — R7989 Other specified abnormal findings of blood chemistry: Secondary | ICD-10-CM | POA: Diagnosis not present

## 2020-12-04 DIAGNOSIS — F411 Generalized anxiety disorder: Secondary | ICD-10-CM | POA: Diagnosis not present

## 2020-12-04 DIAGNOSIS — F3342 Major depressive disorder, recurrent, in full remission: Secondary | ICD-10-CM | POA: Insufficient documentation

## 2020-12-04 DIAGNOSIS — Z634 Disappearance and death of family member: Secondary | ICD-10-CM | POA: Diagnosis not present

## 2020-12-04 MED ORDER — DULOXETINE HCL 60 MG PO CPEP: 60.0000 mg | ORAL_CAPSULE | Freq: Every day | ORAL | 0 refills | Status: DC

## 2020-12-04 MED ORDER — HYDROXYZINE HCL 10 MG PO TABS: 10.0000 mg | ORAL_TABLET | Freq: Two times a day (BID) | ORAL | 0 refills | Status: DC | PRN

## 2020-12-04 NOTE — Patient Instructions (Signed)
Hydroxyzine Capsules or Tablets What is this medication? HYDROXYZINE (hye Arlee i zeen) treats the symptoms of allergies and allergic reactions. It may also be used to treat anxiety or cause drowsiness before a procedure. It works by blocking histamine, a substance released by the body during an allergic reaction. It belongs to a group of medications called antihistamines. This medicine may be used for other purposes; ask your health care provider or pharmacist if you have questions. COMMON BRAND NAME(S): ANX, Atarax, Rezine, Vistaril What should I tell my care team before I take this medication? They need to know if you have any of these conditions: Glaucoma Heart disease History of irregular heartbeat Kidney disease Liver disease Lung or breathing disease, like asthma Stomach or intestine problems Thyroid disease Trouble passing urine An unusual or allergic reaction to hydroxyzine, cetirizine, other medications, foods, dyes or preservatives Pregnant or trying to get pregnant Breast-feeding How should I use this medication? Take this medication by mouth with a full glass of water. Follow the directions on the prescription label. You may take this medication with food or on an empty stomach. Take your medication at regular intervals. Do not take your medication more often than directed. Talk to your care team regarding the use of this medication in children. Special care may be needed. While this medication may be prescribed for children as young as 59 years of age for selected conditions, precautions do apply. Patients over 15 years old may have a stronger reaction and need a smaller dose. Overdosage: If you think you have taken too much of this medicine contact a poison control center or emergency room at once. NOTE: This medicine is only for you. Do not share this medicine with others. What if I miss a dose? If you miss a dose, take it as soon as you can. If it is almost time for your next  dose, take only that dose. Do not take double or extra doses. What may interact with this medication? Do not take this medication with any of the following: Cisapride Dronedarone Pimozide Thioridazine This medication may also interact with the following: Alcohol Antihistamines for allergy, cough, and cold Atropine Barbiturate medications for sleep or seizures, like phenobarbital Certain antibiotics like erythromycin or clarithromycin Certain medications for anxiety or sleep Certain medications for bladder problems like oxybutynin, tolterodine Certain medications for depression or psychotic disturbances Certain medications for irregular heart beat Certain medications for Parkinson's disease like benztropine, trihexyphenidyl Certain medications for seizures like phenobarbital, primidone Certain medications for stomach problems like dicyclomine, hyoscyamine Certain medications for travel sickness like scopolamine Ipratropium Narcotic medications for pain Other medications that prolong the QT interval (which can cause an abnormal heart rhythm) like dofetilide This list may not describe all possible interactions. Give your health care provider a list of all the medicines, herbs, non-prescription drugs, or dietary supplements you use. Also tell them if you smoke, drink alcohol, or use illegal drugs. Some items may interact with your medicine. What should I watch for while using this medication? Tell your care team if your symptoms do not improve. You may get drowsy or dizzy. Do not drive, use machinery, or do anything that needs mental alertness until you know how this medication affects you. Do not stand or sit up quickly, especially if you are an older patient. This reduces the risk of dizzy or fainting spells. Alcohol may interfere with the effect of this medication. Avoid alcoholic drinks. Your mouth may get dry. Chewing sugarless gum or sucking hard  candy, and drinking plenty of water may  help. Contact your care team if the problem does not go away or is severe. This medication may cause dry eyes and blurred vision. If you wear contact lenses you may feel some discomfort. Lubricating drops may help. See your eye care specialist if the problem does not go away or is severe. If you are receiving skin tests for allergies, tell your care team you are using this medication. What side effects may I notice from receiving this medication? Side effects that you should report to your care team as soon as possible: Allergic reactions-skin rash, itching, hives, swelling of the face, lips, tongue, or throat Heart rhythm changes-fast or irregular heartbeat, dizziness, feeling faint or lightheaded, chest pain, trouble breathing Side effects that usually do not require medical attention (report to your care team if they continue or are bothersome): Confusion Drowsiness Dry mouth Hallucinations Headache This list may not describe all possible side effects. Call your doctor for medical advice about side effects. You may report side effects to FDA at 1-800-FDA-1088. Where should I keep my medication? Keep out of the reach of children and pets. Store at room temperature between 15 and 30 degrees C (59 and 86 degrees F). Keep container tightly closed. Throw away any unused medication after the expiration date. NOTE: This sheet is a summary. It may not cover all possible information. If you have questions about this medicine, talk to your doctor, pharmacist, or health care provider.  2022 Elsevier/Gold Standard (2020-05-28 15:19:25)

## 2020-12-04 NOTE — Progress Notes (Signed)
Virtual Visit via Video Note  I connected with [name] on 12/04/20 at 10:30 AM EDT by a video enabled telemedicine application and verified that I am speaking with the correct person using two identifiers.  Location Provider Location : [facility] Patient Location : Home  Participants: Patient , Provider   I discussed the limitations of evaluation and management by telemedicine and the availability of in person appointments. The patient expressed understanding and agreed to proceed.    I discussed the assessment and treatment plan with the patient. The patient was provided an opportunity to ask questions and all were answered. The patient agreed with the plan and demonstrated an understanding of the instructions.   The patient was advised to call back or seek an in-person evaluation if the symptoms worsen or if the condition fails to improve as anticipated.   [place] MD OP Progress Note  12/04/2020 12:53 PM LADASHIA Ayala  MRN:  [id number]  Chief Complaint:  Chief Complaint   Follow-up; Anxiety    HPI: Stephanie Ayala is a 30 year old Caucasian female, employed, separated, lives in [place], has a history of depression, anxiety was evaluated by telemedicine today.  Patient is currently going through several psychosocial stressors.  She reports she and her husband are currently separated but continues to attend family therapy as well as is working on their relationship.  She feels the relationship is getting better since he moved out 2 to 3 weeks ago.  The reason he had to move out was because she asked him to since she wanted to work on the relationship.  Patient reports her grandmother passed away on 2022/12/13.  She is currently grieving her loss.  Patient reports she could not sleep last night since her almost 85-year-old baby kept her up.  Patient is planning to catch up on her sleep this morning.  She is compliant on her medications as prescribed.  Patient denies side effects.  Patient  has been noncompliant with individual psychotherapy, increased to establish care.  She denies any suicidality, homicidality or perceptual disturbances.  Patient denies any other concerns today.   Visit Diagnosis:    ICD-10-CM   1. MDD (major depressive disorder), recurrent, in full remission ([place])  F33.42 DULoxetine (CYMBALTA) 60 MG capsule    2. GAD (generalized anxiety disorder)  F41.1 hydrOXYzine (ATARAX/VISTARIL) 10 MG tablet    DULoxetine (CYMBALTA) 60 MG capsule    3. Bereavement  Z63.4     4. Elevated LFTs  R79.89       Past Psychiatric History: Reviewed past psychiatric history from progress note on 12/21/2019  Past Medical History:  Past Medical History:  Diagnosis Date   Autoimmune disease ([place])    Colitis    Depression    Fibromyalgia     Past Surgical History:  Procedure Laterality Date   COLONOSCOPY WITH PROPOFOL N/A 2020/12/12   Procedure: COLONOSCOPY WITH PROPOFOL;  Surgeon: Lin Landsman, MD;  Location: [facility] ENDOSCOPY;  Service: Gastroenterology;  Laterality: N/A;   OVARY SURGERY Bilateral    cyst removal    Family Psychiatric History: Reviewed family psychiatric history from progress note on 12/21/2019  Family History:  Family History  Problem Relation Age of Onset   Anxiety disorder Mother    Diabetes Father    Hypertension Father    Diabetes Paternal Uncle    Heart disease Paternal Uncle    Alcohol abuse Maternal Grandfather    Heart disease Paternal Grandfather     Social History: Reviewed  social history from progress note on 12/21/2019 Social History   Socioeconomic History   Marital status: Married    Spouse name:  Information systems manager   Number of children: Not on file   Years of education: Not on file   Highest education level: Not on file  Occupational History   Occupation: Not employed  Tobacco Use   Smoking status: Former    Packs/day: 0.00    Types: Cigarettes   Smokeless tobacco: Never  Vaping Use   Vaping Use: Some days  Substance  and Sexual Activity   Alcohol use: Not Currently   Drug use: No   Sexual activity: Yes    Birth control/protection: I.U.D.  Other Topics Concern   Not on file  Social History Narrative   Lives in [place]; with daughter/room-mate. No smoking; no alcohol. On retirement disability.    Social Determinants of Health   Financial Resource Strain: Not on file  Food Insecurity: Not on file  Transportation Needs: Not on file  Physical Activity: Not on file  Stress: Not on file  Social Connections: Not on file    Allergies: No Known Allergies  Metabolic Disorder Labs: No results found for: HGBA1C, MPG No results found for: PROLACTIN Lab Results  Component Value Date   CHOL 204 (H) 02/12/2017   TRIG 327 (H) 11/13/2019   HDL 53 02/12/2017   CHOLHDL 3.8 02/12/2017   LDLCALC 138 (H) 02/12/2017   Lab Results  Component Value Date   TSH 3.040 02/12/2017    Therapeutic Level Labs: No results found for: LITHIUM No results found for: VALPROATE No components found for:  CBMZ  Current Medications: Current Outpatient Medications  Medication Sig Dispense Refill   hydrOXYzine (ATARAX/VISTARIL) 10 MG tablet Take 1 tablet (10 mg total) by mouth 2 (two) times daily as needed. For severe anxiety attacks only 60 tablet 0   acetaminophen (TYLENOL) 500 MG tablet Take 500 mg by mouth every 6 (six) hours as needed.     balsalazide (COLAZAL) 750 MG capsule Take 2,250 mg by mouth 3 (three) times daily.     DULoxetine (CYMBALTA) 30 MG capsule Take 1 capsule (30 mg total) by mouth daily. Take along with 60 mg daily 90 capsule 0   DULoxetine (CYMBALTA) 60 MG capsule Take 1 capsule (60 mg total) by mouth daily. 90 capsule 0   predniSONE (DELTASONE) 20 MG tablet Take 2 tablets (40 mg total) by mouth daily with breakfast. 60 tablet 0   No current facility-administered medications for this visit.     Musculoskeletal: Strength & Muscle Tone:  UTA Gait & Station: normal Patient leans:  N/A  Psychiatric Specialty Exam: Review of Systems  Psychiatric/Behavioral:  Positive for sleep disturbance. The patient is nervous/anxious.   All other systems reviewed and are negative.  currently breastfeeding.There is no height or weight on file to calculate BMI.  General Appearance: Casual  Eye Contact:  Good  Speech:  Clear and Coherent  Volume:  Normal  Mood:  Anxious  Affect:  Congruent  Thought Process:  Goal Directed and Descriptions of Associations: Intact  Orientation:  Full (Time, Place, and Person)  Thought Content: Logical   Suicidal Thoughts:  No  Homicidal Thoughts:  No  Memory:  Immediate;   Fair Recent;   Fair Remote;   Fair  Judgement:  Fair  Insight:  Fair  Psychomotor Activity:  Normal  Concentration:  Concentration: Fair and Attention Span: Fair  Recall:  [employer] of Knowledge: Fair  Language: Fair  Akathisia:  No  Handed:  Right  AIMS (if indicated): done  Assets:  Communication Skills Desire for Improvement Social Support  ADL's:  Intact  Cognition: WNL  Sleep:  Poor   Screenings: GAD-7    Flowsheet Row Video Visit from 10/23/2020 in [place] Routine Prenatal from 10/18/2019 in [facility]  Total GAD-7 Score 15 15      PHQ2-9    [place] from 12/21/2019 in [place] Appointment from 10/26/2019 in [facility] Routine Prenatal from 10/18/2019 in [facility]  PHQ-2 Total Score 2 3 6   PHQ-9 Total Score 15 -- 19      Flowsheet Row Admission (Discharged) from 11/26/2020 in [facility] ENDOSCOPY  C-SSRS RISK CATEGORY No Risk        Assessment and Plan: NAUDIA CROSLEY is a 30 year old Caucasian female, separated, lives in [place], has a history of MDD, GAD was evaluated by telemedicine today.  Patient is currently struggling with grief, also anxiety and sleep as noted above.  Plan as noted  below.  Plan MDD-in remission Cymbalta as prescribed Patient to continue sleep hygiene techniques.  Sleep problems are currently due to her baby not sleeping.  Provided education.  Make use of social support system.  GAD-improving Cymbalta 90 mg p.o. daily Patient encouraged to start individual psychotherapy.  She agrees to do so.  She is currently in family counseling.  Bereavement-unstable Provided support.  Patient will benefit from grief counseling.  Encouraged to schedule an appointment with her therapist. Start hydroxyzine 10 mg p.o. twice daily as needed for anxiety.  Elevated LFTs-unstable Reviewed notes per Dr. Marius Ditch -dated 12/03/2020.  Patient advised to stop Tylenol. 11/20/2020-reviewed AST/ALT-elevated.  Alkaline phosphatase-elevated. We will monitor patient, if her LFT comes back normal after she stops Tylenol then Cymbalta cannot be continued as it is.  Otherwise she will need medication changes.  Discussed breast-feeding implications of her medications.  Follow-up in clinic in 4 weeks or sooner if needed.  This note was generated in part or whole with voice recognition software. Voice recognition is usually quite accurate but there are transcription errors that can and very often do occur. I apologize for any typographical errors that were not detected and corrected.       Ursula Alert, MD 12/04/2020, 12:53 PM

## 2020-12-06 LAB — GI PROFILE, STOOL, PCR

## 2020-12-06 LAB — QUANTIFERON-TB GOLD PLUS
QuantiFERON Mitogen Value: >10 IU/mL
QuantiFERON Nil Value: 0.08 IU/mL
QuantiFERON TB1 Ag Value: 0.06 IU/mL
QuantiFERON TB2 Ag Value: 0.08 IU/mL
QuantiFERON-TB Gold Plus: NEGATIVE

## 2020-12-06 LAB — HEPATITIS B CORE ANTIBODY, TOTAL: Hep B Core Total Ab: NEGATIVE

## 2020-12-06 LAB — HEPATITIS B SURFACE ANTIBODY,QUALITATIVE: Hep B Surface Ab, Qual: REACTIVE

## 2020-12-06 LAB — CALPROTECTIN, FECAL: Calprotectin, Fecal: 83 ug/g (ref 0–120)

## 2020-12-06 LAB — CELIAC DISEASE PANEL
Endomysial IgA: NEGATIVE
IgA/Immunoglobulin A, Serum: 177 mg/dL (ref 87–352)
Transglutaminase IgA: <2 U/mL (ref 0–3)

## 2020-12-06 LAB — HEPATITIS C ANTIBODY: Hep C Virus Ab: <0.1 s/co ratio (ref 0.0–0.9)

## 2020-12-06 LAB — HEPATITIS A ANTIBODY, TOTAL: hep A Total Ab: NEGATIVE

## 2020-12-06 LAB — HEPATITIS B SURFACE ANTIGEN: Hepatitis B Surface Ag: NEGATIVE

## 2020-12-12 ENCOUNTER — Other Ambulatory Visit: Payer: Self-pay

## 2020-12-12 ENCOUNTER — Ambulatory Visit
Admission: RE | Admit: 2020-12-12 | Discharge: 2020-12-12 | Disposition: A | Discharge status: Home / Self Care | Payer: Medicaid Other | Source: Ambulatory Visit | Attending: Gastroenterology | Admitting: Gastroenterology

## 2020-12-12 DIAGNOSIS — K51911 Ulcerative colitis, unspecified with rectal bleeding: Secondary | ICD-10-CM | POA: Insufficient documentation

## 2020-12-12 DIAGNOSIS — R7989 Other specified abnormal findings of blood chemistry: Secondary | ICD-10-CM | POA: Insufficient documentation

## 2020-12-13 ENCOUNTER — Encounter: Payer: Self-pay | Admitting: Gastroenterology

## 2020-12-30 ENCOUNTER — Telehealth (INDEPENDENT_AMBULATORY_CARE_PROVIDER_SITE_OTHER): Payer: Self-pay | Admitting: Psychiatry

## 2020-12-30 ENCOUNTER — Other Ambulatory Visit: Payer: Self-pay

## 2020-12-30 DIAGNOSIS — Z91199 Patient's noncompliance with other medical treatment and regimen due to unspecified reason: Secondary | ICD-10-CM

## 2020-12-30 NOTE — Progress Notes (Signed)
No response to call or text or video invite  

## 2021-01-22 ENCOUNTER — Ambulatory Visit: Payer: 59 | Admitting: Gastroenterology

## 2021-03-04 ENCOUNTER — Ambulatory Visit (INDEPENDENT_AMBULATORY_CARE_PROVIDER_SITE_OTHER): Payer: Medicaid Other | Admitting: Gastroenterology

## 2021-03-04 ENCOUNTER — Encounter: Payer: Self-pay | Admitting: Gastroenterology

## 2021-03-04 VITALS — BP 125/83 | HR 106 | Temp 98.1°F | Ht 62.0 in | Wt 198.6 lb

## 2021-03-04 DIAGNOSIS — R7989 Other specified abnormal findings of blood chemistry: Secondary | ICD-10-CM

## 2021-03-04 DIAGNOSIS — K51011 Ulcerative (chronic) pancolitis with rectal bleeding: Secondary | ICD-10-CM | POA: Diagnosis not present

## 2021-03-04 DIAGNOSIS — K760 Fatty (change of) liver, not elsewhere classified: Secondary | ICD-10-CM

## 2021-03-04 NOTE — Progress Notes (Signed)
Cephas Darby, MD [address]  [place]  [place], [place] [address]  Main: [phone]  Fax: [phone]    Gastroenterology Consultation  Referring Provider:     [facility] Primary Care Physician:  [facility] Primary Gastroenterologist: [facility] Reason for Consultation:     Ulcerative colitis and elevated LFTs        HPI:   Stephanie Ayala is a 30 y.o. female referred by Dr. Shari Prows, [facility]  for consultation & management of ulcerative colitis.  Patient is diagnosed with ulcerative colitis in 2018.  Her fecal calprotectin levels were 370 at that time, elevated CRP 21.8 mg/L, ESR 42.  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  I do not have copy of entire procedure report.  Celiac panel was negative.  Stool studies were negative for infection.  Patient has been started on balsalazide 750 mg 3 pills daily, she was instructed to take 6 pills daily.  She was briefly on prednisone at the time of initial diagnosis.  Her fecal calprotectin level normalized, then increased to 126 in 2021.  Patient became pregnant in 2021, delivered in September 2021.  She reports that her pregnancy was overall uneventful except for intermittent loose stools and scant blood on wiping occasionally.  However, over the last 9 months, patient is experiencing lower abdominal cramps associated with significant rectal bleeding.  She is also currently breast-feeding.  She had iron deficiency anemia in her pregnancy.  Patient has been followed by [facility], she wanted to switch her care to me  Follow-up visit 12/03/2020 Patient is here for follow-up of ulcerative colitis with recent flareup.  She underwent colonoscopy which revealed moderate active chronic colitis in the cecum and rectum and inactive colitis in the rest of the colon, CRP is mildly elevated to  1.8 mg/dL.  Her LFTs are also elevated.  Patient is here to discuss next steps.  Patient did not tolerate prednisone, developed mood swings and was getting irritated at her daughter.  Therefore, discontinued it She is currently taking balsalazide 750 mg 6 pills daily  Follow-up visit 03/04/2021 Patient is here for follow-up of ulcerative colitis.  Patient stopped taking balsalazide because she is not seeing any improvement in her symptoms.  Patient's repeat LFTs are elevated, underwent right upper quadrant ultrasound which revealed fatty liver.  Patient was also taking higher doses of Tylenol which she stopped since last visit.  Her GI symptoms have more or less unchanged.  Does report mild rectal bleeding with mucus mixed with stools.  Having 1-2 soft bowel movements daily.  She is still breast-feeding.  Patient does not smoke or drink alcohol Patient reports pain in her knee joints, hands, denies any other extraintestinal manifestations  NSAIDs: None  Antiplts/Anticoagulants/Anti thrombotics: None  GI Procedures: Colonoscopy 2018, report not available Pathology revealed chronic active colitis in cecum, chronic active proctitis and rectum  Colonoscopy 11/26/2020 - The examined portion of the ileum was normal. Biopsied. - Localized moderate inflammation was found in the cecum secondary to colitis. Biopsied. - Normal mucosa in the recto-sigmoid colon, in the sigmoid colon, in the descending colon, in the transverse colon and in the ascending colon. Biopsied. - Diffuse moderate inflammation was found in the distal rectum secondary to proctitis ulcerative colitis. Biopsied. - One 5 mm polyp in the descending colon, removed with a cold snare. Resected and  retrieved.  DIAGNOSIS:  A. TERMINAL ILEUM, RANDOM; COLD BIOPSY:  - ENTERIC MUCOSA WITH NORMAL VILLOUS ARCHITECTURE AND REACTIVE LYMPHOID  HYPERPLASIA.  - NEGATIVE FOR ACTIVE ILEITIS.  - NEGATIVE FOR GRANULOMA, DYSPLASIA, AND MALIGNANCY.    B. COLON, RANDOM CECUM; COLD BIOPSY:  - CHRONIC COLITIS WITH MILD TO MODERATE ACTIVITY (CRYPTITIS AND FOCAL  CRYPT DISRUPTION).  - NEGATIVE FOR GRANULOMA, DYSPLASIA, AND MALIGNANCY.   C. COLON, RANDOM ASCENDING; COLD BIOPSY:  - CHRONIC COLITIS WITHOUT ACTIVITY.  - NEGATIVE FOR GRANULOMA, DYSPLASIA, AND MALIGNANCY.   D. COLON, RANDOM TRANSVERSE; COLD BIOPSY:  - CHRONIC COLITIS WITHOUT ACTIVITY.  - NEGATIVE FOR GRANULOMA, DYSPLASIA, AND MALIGNANCY.   E. COLON, RANDOM DESCENDING; COLD BIOPSY:  - CHRONIC COLITIS WITHOUT ACTIVITY.  - NEGATIVE FOR GRANULOMA, DYSPLASIA, AND MALIGNANCY.   F. COLON POLYP, DESCENDING; COLD SNARE:  - SESSILE SERRATED POLYP.  - NEGATIVE FOR DYSPLASIA AND MALIGNANCY.   G. COLON, RANDOM SIGMOID; COLD BIOPSY:  - CHRONIC COLITIS WITHOUT ACTIVITY.  - NEGATIVE FOR GRANULOMA, DYSPLASIA, AND MALIGNANCY.   H. 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  - NEGATIVE FOR GRANULOMA, DYSPLASIA, AND MALIGNANCY.   Past Medical History:  Diagnosis Date   Autoimmune disease ([place])    Colitis    Depression    Fibromyalgia     Past Surgical History:  Procedure Laterality Date   COLONOSCOPY WITH PROPOFOL N/A 11/26/2020   Procedure: COLONOSCOPY WITH PROPOFOL;  Surgeon: Lin Landsman, MD;  Location[facility];  Service: Gastroenterology;  Laterality: N/A;   OVARY SURGERY Bilateral    cyst removal    Current Outpatient Medications:    acetaminophen (TYLENOL) 500 MG tablet, Take 500 mg by mouth every 6 (six) hours as needed., Disp: , Rfl:    DULoxetine (CYMBALTA) 30 MG capsule, Take 1 capsule (30 mg total) by mouth daily. Take along with 60 mg daily, Disp: 90 capsule, Rfl: 0   DULoxetine (CYMBALTA) 60 MG capsule, Take 1 capsule (60 mg total) by mouth daily., Disp: 90 capsule, Rfl: 0   hydrOXYzine (ATARAX/VISTARIL) 10 MG tablet, Take 1 tablet (10 mg total) by mouth 2 (two) times daily as needed.  For severe anxiety attacks only, Disp: 60 tablet, Rfl: 0   balsalazide (COLAZAL) 750 MG capsule, Take 2,250 mg by mouth 3 (three) times daily. (Patient not taking: Reported on 03/04/2021), Disp: , Rfl:     Family History  Problem Relation Age of Onset   Anxiety disorder Mother    Diabetes Father    Hypertension Father    Diabetes Paternal Uncle    Heart disease Paternal Uncle    Alcohol abuse Maternal Grandfather    Heart disease Paternal Grandfather      Social History   Tobacco Use   Smoking status: Former    Packs/day: 0.00    Types: Cigarettes   Smokeless tobacco: Never  Vaping Use   Vaping Use: Some days  Substance Use Topics   Alcohol use: Not Currently   Drug use: No    Allergies as of 03/04/2021   (No Known Allergies)    Review of Systems:    All systems reviewed and negative except where noted in HPI.   Physical Exam:  BP 125/83 (BP Location: Left Arm, Patient Position: Sitting, Cuff Size: Large)   Pulse (!) 106   Temp 98.1 F (36.7 C) (Oral)   Ht 5' 2"  (1.575 m)   Wt 198 lb 9.6 oz (90.1 kg)  BMI 36.32 kg/m  No LMP recorded.  General:   Alert,  Well-developed, well-nourished, pleasant and cooperative in NAD Head:  Normocephalic and atraumatic. Eyes:  Sclera clear, no icterus.   Conjunctiva pink. Ears:  Normal auditory acuity. Nose:  No deformity, discharge, or lesions. Mouth:  No deformity or lesions,oropharynx pink & moist. Neck:  Supple; no masses or thyromegaly. Lungs:  Respirations even and unlabored.  Clear throughout to auscultation.   No wheezes, crackles, or rhonchi. No acute distress. Heart:  Regular rate and rhythm; no murmurs, clicks, rubs, or gallops. Abdomen:  Normal bowel sounds. Soft, non-tender and non-distended without masses, hepatosplenomegaly or hernias noted.  No guarding or rebound tenderness.   Rectal: Not performed Msk:  Symmetrical without gross deformities. Good, equal movement & strength bilaterally. Pulses:  Normal  pulses noted. Extremities:  No clubbing or edema.  No cyanosis. Neurologic:  Alert and oriented x3;  grossly normal neurologically. Skin:  Intact without significant lesions or rashes. No jaundice. Psych:  Alert and cooperative. Normal mood and affect.  Imaging Studies: None  Assessment and Plan:   Stephanie Ayala is a 61 y.o. pleasant Caucasian female with history of ulcerative colitis, maintained on balsalazide 750 mg 3 times daily is seen in consultation for 9 months history of exacerbation of ulcerative colitis  Exacerbation of ulcerative colitis Colonoscopy confirmed chronic moderately active colitis in the cecum as well as rectum and inactive colitis in rest of the colon.  TI biopsies normal CRP elevated to 1.8 mg/dL GI profile PCR negative, calprotectin levels within normal range TPMT levels confirm homozygosity Discontinued balsalazide due to lack of response Patient did not tolerate prednisone Patient will need biologic therapy at this time due to extensive colitis However, she has elevated LFTs which need further work-up, so far work-up revealed fatty liver. One option we have is to try ozanimod/Zeposia  Elevated LFTs Patient used to Tylenol 650 mg 2 pills daily for joint and body aches, stopped 2 or 3 months ago She denies alcohol use, denies any natural supplements Recheck LFTs today, if persistently elevated, recommend rest of the secondary liver disease work-up celiac panel negative viral hepatitis panel negative Ultrasound liver revealed fatty liver   Follow up in 3-4 months   Cephas Darby, MD

## 2021-03-05 ENCOUNTER — Telehealth: Payer: Self-pay

## 2021-03-05 ENCOUNTER — Encounter: Payer: Self-pay | Admitting: Gastroenterology

## 2021-03-05 ENCOUNTER — Other Ambulatory Visit: Payer: Self-pay | Admitting: Gastroenterology

## 2021-03-05 ENCOUNTER — Other Ambulatory Visit: Payer: Self-pay

## 2021-03-05 DIAGNOSIS — R7989 Other specified abnormal findings of blood chemistry: Secondary | ICD-10-CM

## 2021-03-05 LAB — HEPATIC FUNCTION PANEL
ALT: 50 IU/L — ABNORMAL HIGH (ref 0–32)
AST: 32 IU/L (ref 0–40)
Albumin: 4.5 g/dL (ref 3.9–5.0)
Alkaline Phosphatase: 138 IU/L — ABNORMAL HIGH (ref 44–121)
Bilirubin Total: 0.9 mg/dL (ref 0.0–1.2)
Bilirubin, Direct: 0.17 mg/dL (ref 0.00–0.40)
Total Protein: 7.7 g/dL (ref 6.0–8.5)

## 2021-03-05 NOTE — Telephone Encounter (Signed)
CALLED PATIENT NO ANSWER LEFT VOICEMAIL FOR A CALL BACK ? ?

## 2021-03-05 NOTE — Telephone Encounter (Signed)
-----   Message from Lin Landsman, MD sent at 03/05/2021  2:17 PM EST ----- Please inform patient that her liver enzymes are still elevated.  I recommend further work-up.  Ordered labs, please release those  Thanks RV

## 2021-03-05 NOTE — Telephone Encounter (Signed)
Called patient about her results and released labs she will come by and get them done soon patient had no questions

## 2021-03-06 ENCOUNTER — Encounter: Payer: Self-pay | Admitting: Psychiatry

## 2021-03-06 ENCOUNTER — Telehealth (INDEPENDENT_AMBULATORY_CARE_PROVIDER_SITE_OTHER): Payer: Medicaid Other | Admitting: Psychiatry

## 2021-03-06 ENCOUNTER — Other Ambulatory Visit: Payer: Self-pay

## 2021-03-06 DIAGNOSIS — Z91199 Patient's noncompliance with other medical treatment and regimen due to unspecified reason: Secondary | ICD-10-CM | POA: Insufficient documentation

## 2021-03-06 DIAGNOSIS — F411 Generalized anxiety disorder: Secondary | ICD-10-CM | POA: Diagnosis not present

## 2021-03-06 DIAGNOSIS — Z9189 Other specified personal risk factors, not elsewhere classified: Secondary | ICD-10-CM | POA: Insufficient documentation

## 2021-03-06 DIAGNOSIS — R7989 Other specified abnormal findings of blood chemistry: Secondary | ICD-10-CM

## 2021-03-06 DIAGNOSIS — Z634 Disappearance and death of family member: Secondary | ICD-10-CM | POA: Diagnosis not present

## 2021-03-06 DIAGNOSIS — F331 Major depressive disorder, recurrent, moderate: Secondary | ICD-10-CM | POA: Diagnosis not present

## 2021-03-06 MED ORDER — ARIPIPRAZOLE 2 MG PO TABS: 2.0000 mg | ORAL_TABLET | Freq: Every day | ORAL | 0 refills | Status: DC

## 2021-03-06 MED ORDER — DULOXETINE HCL 30 MG PO CPEP
30.0000 mg | ORAL_CAPSULE | Freq: Every day | ORAL | 0 refills | Status: DC
Start: 2021-03-06 — End: 2021-03-27

## 2021-03-06 MED ORDER — HYDROXYZINE PAMOATE 25 MG PO CAPS: 25.0000 mg | ORAL_CAPSULE | Freq: Three times a day (TID) | ORAL | 1 refills | Status: DC | PRN

## 2021-03-06 NOTE — Progress Notes (Signed)
Virtual Visit via Video Note  I connected with Stephanie Ayala on 03/06/21 at  9:40 AM EST by a video enabled telemedicine application and verified that I am speaking with the correct person using two identifiers.  Location Provider Location : [facility] Patient Location : Home  Participants: Patient , Provider   I discussed the limitations of evaluation and management by telemedicine and the availability of in person appointments. The patient expressed understanding and agreed to proceed.    I discussed the assessment and treatment plan with the patient. The patient was provided an opportunity to ask questions and all were answered. The patient agreed with the plan and demonstrated an understanding of the instructions.   The patient was advised to call back or seek an in-person evaluation if the symptoms worsen or if the condition fails to improve as anticipated.  [facility] MD OP Progress Note  03/06/2021 2:19 PM Stephanie Ayala  MRN:  [id number]  Chief Complaint:  Chief Complaint   Follow-up; Anxiety; Depression    HPI: Stephanie Ayala is a 30 year old Caucasian female, employed, separated, lives in [place], has a history of depression, anxiety was evaluated by telemedicine today.  Patient today reports she missed her last visit with Probation officer, apologized for the same.  She reports she is currently struggling with mood lability, irritability, feeling overwhelmed, anxious often.  She does not believe the current medications is beneficial.  Patient also reports sleep problems.  She has difficulty falling asleep.  She goes to bed at around 10 PM.  Unable to fall asleep till around 2 AM.  She stays in bed and sleeps till around noon the next day.  She reports her baby sleeps with her however she does have to get up to change diapers  as well as for feeding.  Patient reports she is currently working on her relationship with her spouse.  Patient denies any suicidality, homicidality or perceptual  disturbances.  Patient with elevated LFTs, ulcerating colitis, currently under the care of gastroenterology-reviewed notes per Dr. Gilford Rile dated 03/04/2021-patient had right upper quadrant ultrasound which revealed fatty liver.  Patient also was taking a higher dose of Tylenol which she stopped since last visit.  There is some mild rectal bleeding.  Recheck LFTs, if persistently elevated recommend rest of the secondary liver disease work-up.'    Visit Diagnosis:    ICD-10-CM   1. MDD (major depressive disorder), recurrent episode, moderate (HCC)  F33.1 ARIPiprazole (ABILIFY) 2 MG tablet    hydrOXYzine (VISTARIL) 25 MG capsule    DULoxetine (CYMBALTA) 30 MG capsule    2. GAD (generalized anxiety disorder)  F41.1 hydrOXYzine (VISTARIL) 25 MG capsule    DULoxetine (CYMBALTA) 30 MG capsule    3. Bereavement  Z63.4     4. At risk for prolonged QT interval syndrome  Z91.89 EKG 12-Lead    5. Elevated LFTs  R79.89     6. Noncompliance with treatment  Z91.199       Past Psychiatric History: Reviewed past psychiatric history from progress note on 12/21/2019.  Past Medical History:  Past Medical History:  Diagnosis Date   Autoimmune disease ([place])    Colitis    Depression    Fibromyalgia     Past Surgical History:  Procedure Laterality Date   COLONOSCOPY WITH PROPOFOL N/A 11/26/2020   Procedure: COLONOSCOPY WITH PROPOFOL;  Surgeon: Lin Landsman, MD;  Location[facility] END[facility]COPY;  Service: Gastroenterology;  Laterality: N/A;   OVARY SURGERY Bilateral    cyst removal  Family Psychiatric History: Reviewed family psychiatric history from progress note on 12/21/2019.  Family History:  Family History  Problem Relation Age of Onset   Anxiety disorder Mother    Diabetes Father    Hypertension Father    Diabetes Paternal Uncle    Heart disease Paternal Uncle    Alcohol abuse Maternal Grandfather    Heart disease Paternal Grandfather     Social History: Reviewed social history  from progress note on 12/21/2019. Social History   Socioeconomic History   Marital status: Married    Spouse name:  Dylan   Number of children: Not on file   Years of education: Not on file   Highest education level: Not on file  Occupational History   Occupation: Not employed  Tobacco Use   Smoking status: Former    Packs/day: 0.00    Types: Cigarettes   Smokeless tobacco: Never  Vaping Use   Vaping Use: Some days  Substance and Sexual Activity   Alcohol use: Not Currently   Drug use: No   Sexual activity: Yes    Birth control/protection: I.U.D.  Other Topics Concern   Not on file  Social History Narrative   [place]ives [place]; with daughter/room-mate. No smoking; no alcohol. On retirement disability.    Social Determinants of Health   Financial Resource Strain: Not on file  Food Insecurity: Not on file  Transportation Needs: Not on file  Physical Activity: Not on file  Stress: Not on file  Social Connections: Not on file    Allergies: No Known Allergies  Metabolic Disorder Labs: No results found for: HGBA1C, MPG No results found for: PROLACTIN Lab Results  Component Value Date   CHOL 204 (H) 02/12/2017   TRIG 327 (H) 11/13/2019   HDL 53 02/12/2017   CHOLHDL 3.8 02/12/2017   LDLCALC 138 (H) 02/12/2017   Lab Results  Component Value Date   TSH 3.040 02/12/2017    Therapeutic Level Labs: No results found for: LITHIUM No results found for: VALPROATE No components found for:  CBMZ  Current Medications: Current Outpatient Medications  Medication Sig Dispense Refill   ARIPiprazole (ABILIFY) 2 MG tablet Take 1 tablet (2 mg total) by mouth daily. 30 tablet 0   hydrOXYzine (VISTARIL) 25 MG capsule Take 1 capsule (25 mg total) by mouth 3 (three) times daily as needed for anxiety. 90 capsule 1   acetaminophen (TYLENOL) 500 MG tablet Take 500 mg by mouth every 6 (six) hours as needed.     balsalazide (COLAZAL) 750 MG capsule Take 2,250 mg by mouth 3 (three) times  daily. (Patient not taking: Reported on 03/04/2021)     DULoxetine (CYMBALTA) 30 MG capsule Take 1 capsule (30 mg total) by mouth daily. Take along with 60 mg daily 90 capsule 0   No current facility-administered medications for this visit.     Musculoskeletal: Strength & Muscle Tone:  UTA Gait & Station:  Seated Patient leans: N/A  Psychiatric Specialty Exam: Review of Systems  Psychiatric/Behavioral:  Positive for dysphoric mood and sleep disturbance. The patient is nervous/anxious.   All other systems reviewed and are negative.  currently breastfeeding.There is no height or weight on file to calculate BMI.  General Appearance: Casual  Eye Contact:  Fair  Speech:  Clear and Coherent  Volume:  Normal  Mood:  Anxious and Depressed  Affect:  Congruent  Thought Process:  Goal Directed and Descriptions of Associations: Intact  Orientation:  Full (Time, Place, and Person)  Thought Content:  Logical   Suicidal Thoughts:  No  Homicidal Thoughts:  No  Memory:  Immediate;   Fair Recent;   Fair Remote;   Fair  Judgement:  Fair  Insight:  Fair  Psychomotor Activity:  Normal  Concentration:  Concentration: Fair and Attention Span: Fair  Recall:  [employer] of Knowledge: Fair  Language: Fair  Akathisia:  No  Handed:  Right  AIMS (if indicated): done  Assets:  Communication Skills Desire for Improvement Housing Social Support  ADL's:  Intact  Cognition: WNL  Sleep: Excessive   Screenings: GAD-7    Flowsheet Row Video Visit from 10/23/2020 in [facility] Routine Prenatal from 10/18/[facility] in [facility]  Total GAD-7 Score 15 15      PHQ2-9    [place] Video Visit from 03/06/2021 in [facility] from 12/21/2019 in [facility] Appointment from 10/26/2019 in [facility] Routine Prenatal from 10/18/2019 in [facility]  PHQ-2 Total Score 1 2  3 6   PHQ-9 Total Score 9 15 -- 19      Flowsheet Row Admission (Discharged) from 11/26/2020 in [facility]OSCOPY  C-SSRS RISK CATEGORY No Risk        Assessment and Plan: Stephanie Ayala is a 25 year old Caucasian female, separated, lives in [place], has a history of MDD, GAD was evaluated by telemedicine today.  Patient with noncompliance with follow-up recommendations today returns reporting worsening mood symptoms, irritability, also has elevated LFTs currently under the care of gastroenterology.  Plan as noted below.  Plan MDD-unstable Given her most recent elevated LFTs as well as worsening mood symptoms we will reduce Cymbalta to 30 mg p.o. daily We will consider adding another antidepressant. For now we will add Abilify 2 mg p.o. daily in the morning Patient advised to work on sleep hygiene. Patient advised to start CBT-she has been noncompliant  GAD-unstable Increase hydroxyzine to 25 mg p.o. daily as needed for severe anxiety attacks Patient has been referred for CBT-encouraged compliance Cymbalta as prescribed.  Bereavement-improving Provided counseling  Elevated LFTs-unstable Patient is currently under the care of gastroenterology-reviewed notes dated 03/04/2021 as noted above We will reduce Cymbalta to 30 mg p.o. daily  At risk for prolonged QT syndrome-ordered EKG-patient advised to call [phone] for an appointment.  Noncompliance with treatment plan-provided education, encouraged compliance  Follow-up in clinic in 2 to 3 weeks or sooner if needed.  This note was generated in part or whole with voice recognition software. Voice recognition is usually quite accurate but there are transcription errors that can and very often do occur. I apologize for any typographical errors that were not detected and corrected.      Ursula Alert, MD 03/06/2021, 2:19 PM

## 2021-03-06 NOTE — Patient Instructions (Signed)
Aripiprazole Tablets What is this medication? ARIPIPRAZOLE (ay ri PIP ray zole) treats schizophrenia, bipolar I disorder, autism spectrum disorder, and Tourette disorder. It may also be used with antidepressant medications to treat depression. It works by balancing the levels of dopamine and serotonin in the brain, hormones that help regulate mood, behaviors, and thoughts. It belongs to a group of medications called antipsychotics. Antipsychotics can be used to treat several kinds of mental health conditions. This medicine may be used for other purposes; ask your health care provider or pharmacist if you have questions. COMMON BRAND NAME(S): Abilify What should I tell my care team before I take this medication? They need to know if you have any of these conditions: Dementia Diabetes Difficulty swallowing Have trouble controlling your muscles Have urges you are unable to control (for example, gambling, spending money, or eating) Heart disease History of irregular heartbeat History of stroke Low blood counts, like low white cell, platelet, or red cell counts Low blood pressure Parkinson disease Seizures Suicidal thoughts, plans or attempt; a previous suicide attempt by you or a family member An unusual or allergic reaction to aripiprazole, other medications, foods, dyes, or preservatives Pregnant or trying to get pregnant Breast-feeding How should I use this medication? Take this medication by mouth with a glass of water. Follow the directions on the prescription label. You can take this medication with or without food. Take your doses at regular intervals. Do not take your medication more often than directed. Do not stop taking except on the advice of your care team. A special MedGuide will be given to you by the pharmacist with each prescription and refill. Be sure to read this information carefully each time. Talk to your care team regarding the use of this medication in children. While  this medication may be prescribed for children as young as 6 years of age for selected conditions, precautions do apply. Overdosage: If you think you have taken too much of this medicine contact a poison control center or emergency room at once. NOTE: This medicine is only for you. Do not share this medicine with others. What if I miss a dose? If you miss a dose, take it as soon as you can. If it is almost time for your next dose, take only that dose. Do not take double or extra doses. What may interact with this medication? Do not take this medication with any of the following: Brexpiprazole Cisapride Dextromethorphan; quinidine Dronedarone Metoclopramide Pimozide Quinidine Thioridazine This medication may also interact with the following: Antihistamines for allergy, cough, and cold Carbamazepine Certain medications for anxiety or sleep Certain medications for depression like amitriptyline, fluoxetine, paroxetine, sertraline Certain medications for fungal infections like fluconazole, itraconazole, ketoconazole, posaconazole, voriconazole Clarithromycin General anesthetics like halothane, isoflurane, methoxyflurane, propofol Levodopa or other medications for Parkinson's disease Medications for blood pressure Medications for seizures Medications that relax muscles for surgery Narcotic medications for pain Other medications that prolong the QT interval (cause an abnormal heart rhythm) Phenothiazines like chlorpromazine, prochlorperazine Rifampin This list may not describe all possible interactions. Give your health care provider a list of all the medicines, herbs, non-prescription drugs, or dietary supplements you use. Also tell them if you smoke, drink alcohol, or use illegal drugs. Some items may interact with your medicine. What should I watch for while using this medication? Visit your care team for regular checks on your progress. Tell your care team if symptoms do not start to  get better or if they get worse. Do not  stop taking except on your care team's advice. You may develop a severe reaction. Your care team will tell you how much medication to take. Patients and their families should watch out for new or worsening depression or thoughts of suicide. Also watch out for sudden changes in feelings such as feeling anxious, agitated, panicky, irritable, hostile, aggressive, impulsive, severely restless, overly excited and hyperactive, or not being able to sleep. If this happens, especially at the beginning of antidepressant treatment or after a change in dose, call your care team. You may get dizzy or drowsy. Do not drive, use machinery, or do anything that needs mental alertness until you know how this medication affects you. Do not stand or sit up quickly, especially if you are an older patient. This reduces the risk of dizzy or fainting spells. Alcohol may interfere with the effect of this medication. Avoid alcoholic drinks. This medication can cause problems with controlling your body temperature. It can lower the response of your body to cold temperatures. If possible, stay indoors during cold weather. If you must go outdoors, wear warm clothes. It can also lower the response of your body to heat. Do not overheat. Do not over-exercise. Stay out of the sun when possible. If you must be in the sun, wear cool clothing. Drink plenty of water. If you have trouble controlling your body temperature, call your care team right away. This medication may cause dry eyes and blurred vision. If you wear contact lenses, you may feel some discomfort. Lubricating drops may help. See your eye care specialist if the problem does not go away or is severe. This medication may increase blood sugar. Ask your care team if changes in diet or medications are needed if you have diabetes. There have been reports of increased sexual urges or other strong urges such as gambling while taking this medication.  If you experience any of these while taking this medication, you should report this to your care team as soon as possible. What side effects may I notice from receiving this medication? Side effects that you should report to your care team as soon as possible: Allergic reactions--skin rash, itching, hives, swelling of the face, lips, tongue, or throat High blood sugar (hyperglycemia)--increased thirst or amount of urine, unusual weakness or fatigue, blurry vision High fever, stiff muscles, increased sweating, fast or irregular heartbeat, and confusion, which may be signs of neuroleptic malignant syndrome Low blood pressure--dizziness, feeling faint or lightheaded, blurry vision Pain or trouble swallowing Prolonged or painful erection Seizures Stroke--sudden numbness or weakness of the face, arm, or leg, trouble speaking, confusion, trouble walking, loss of balance or coordination, dizziness, severe headache, change in vision Uncontrolled and repetitive body movements, muscle stiffness or spasms, tremors or shaking, loss of balance or coordination, restlessness, shuffling walk, which may be signs of extrapyramidal symptoms (EPS) Thoughts of suicide or self-harm, worsening mood, feelings of depression Urges to engage in impulsive behaviors such as gambling, binge eating, sexual activity, or shopping in ways that are unusual for you Side effects that usually do not require medical attention (report these to your care team if they continue or are bothersome): Constipation Drowsiness Weight gain This list may not describe all possible side effects. Call your doctor for medical advice about side effects. You may report side effects to FDA at 1-800-FDA-1088. Where should I keep my medication? Keep out of the reach of children and pets. Store at room temperature between 15 and 30 degrees C (59 and 86 degrees F).  Throw away any unused medication after the expiration date. NOTE: This sheet is a summary.  It may not cover all possible information. If you have questions about this medicine, talk to your doctor, pharmacist, or health care provider.  2022 Elsevier/Gold Standard (2020-12-03 00:00:00)

## 2021-03-13 ENCOUNTER — Ambulatory Visit
Admission: RE | Admit: 2021-03-13 | Discharge: 2021-03-13 | Disposition: A | Discharge status: Home / Self Care | Payer: Medicaid Other | Source: Ambulatory Visit | Attending: Psychiatry | Admitting: Psychiatry

## 2021-03-13 ENCOUNTER — Other Ambulatory Visit: Payer: Self-pay

## 2021-03-13 DIAGNOSIS — Z9189 Other specified personal risk factors, not elsewhere classified: Secondary | ICD-10-CM | POA: Diagnosis not present

## 2021-03-27 ENCOUNTER — Telehealth (INDEPENDENT_AMBULATORY_CARE_PROVIDER_SITE_OTHER): Payer: Medicaid Other | Admitting: Psychiatry

## 2021-03-27 ENCOUNTER — Other Ambulatory Visit: Payer: Self-pay

## 2021-03-27 ENCOUNTER — Encounter: Payer: Self-pay | Admitting: Psychiatry

## 2021-03-27 DIAGNOSIS — F331 Major depressive disorder, recurrent, moderate: Secondary | ICD-10-CM | POA: Diagnosis not present

## 2021-03-27 DIAGNOSIS — Z634 Disappearance and death of family member: Secondary | ICD-10-CM | POA: Diagnosis not present

## 2021-03-27 DIAGNOSIS — F411 Generalized anxiety disorder: Secondary | ICD-10-CM

## 2021-03-27 DIAGNOSIS — Z9189 Other specified personal risk factors, not elsewhere classified: Secondary | ICD-10-CM | POA: Diagnosis not present

## 2021-03-27 MED ORDER — DULOXETINE HCL 30 MG PO CPEP
30.0000 mg | ORAL_CAPSULE | Freq: Every day | ORAL | 0 refills | Status: DC
Start: 2021-03-27 — End: 2021-06-23

## 2021-03-27 MED ORDER — ARIPIPRAZOLE 2 MG PO TABS
2.0000 mg | ORAL_TABLET | Freq: Every day | ORAL | 0 refills | Status: DC
Start: 2021-03-27 — End: 2021-05-05

## 2021-03-27 NOTE — Progress Notes (Signed)
Virtual Visit via Video Note  I connected with Stephanie Ayala on 03/27/21 at  2:30 PM EST by a video enabled telemedicine application and verified that I am speaking with the correct person using two identifiers.  Location Provider Location : [facility] Patient Location : Home  Participants: Patient , Provider    I discussed the limitations of evaluation and management by telemedicine and the availability of in person appointments. The patient expressed understanding and agreed to proceed.   I discussed the assessment and treatment plan with the patient. The patient was provided an opportunity to ask questions and all were answered. The patient agreed with the plan and demonstrated an understanding of the instructions.   The patient was advised to call back or seek an in-person evaluation if the symptoms worsen or if the condition fails to improve as anticipated.    Ali Molina MD OP Progress Note  03/27/2021 5:01 PM Stephanie Ayala  MRN:  [id number]  Chief Complaint:  Chief Complaint   Follow-up; Anxiety    HPI: Stephanie Ayala is a 30 year old Caucasian female, employed, separated, lives in [place], has a history of depression, anxiety was evaluated by telemedicine today.  Patient reports she is currently struggling with upper respiratory tract infection symptoms like congestion, lethargy.  She reports her 31-monthold baby is also struggling with similar symptoms.  She reports she hence has not been sleeping at night since she has been up taking care of the baby since the past few nights.  That does affect her mood .  She hence has been feeling more tired.  Patient continues to struggle with sadness, low motivation, low energy as well as sleep problems.  She has not picked up the Abilify from the pharmacy yet.  She reports her insurance plan lapsed and she  wants her medications to be sent to [facility] pharmacy where it will be cheaper for her.  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  She is often nervous, anxious.  This has been getting worse since the past several months.  Patient was advised to establish care with a therapist however reports she has not followed through with recommendations.  She was in family therapy recently and reports her relationship with her husband is getting better.  She does have good support system from her mother.  Denies suicidality, homicidality or perceptual disturbances.  Patient denies any other concerns today.  Visit Diagnosis:    ICD-10-CM   1. MDD (major depressive disorder), recurrent episode, moderate (HCC)  F33.1 ARIPiprazole (ABILIFY) 2 MG tablet    DULoxetine (CYMBALTA) 30 MG capsule    2. GAD (generalized anxiety disorder)  F41.1 DULoxetine (CYMBALTA) 30 MG capsule    3. Bereavement  Z63.4     4. At risk for prolonged QT interval syndrome  Z91.89       Past Psychiatric History: Reviewed past psychiatric history from progress note on 12/21/2019  Past Medical History:  Past Medical History:  Diagnosis Date   Autoimmune disease ([place]    Colitis    Depression    Fibromyalgia     Past Surgical History:  Procedure Laterality Date   COLONOSCOPY WITH PROPOFOL N/A 11/26/2020   Procedure: COLONOSCOPY WITH PROPOFOL;  Surgeon: VLin Landsman MD;  Location: [facility] ENDOSCOPY;  Service: Gastroenterology;  Laterality: N/A;   OVARY SURGERY Bilateral    cyst removal    Family Psychiatric History: Reviewed family psychiatric history from progress note on 12/21/2019  Family History:  Family History  Problem Relation Age of Onset   Anxiety disorder Mother    Diabetes Father    Hypertension Father    Diabetes Paternal Uncle    Heart disease Paternal Uncle    Alcohol abuse Maternal Grandfather    Heart disease Paternal Grandfather     Social History: Reviewed social history from progress note on 12/21/2019 Social History   Socioeconomic History   Marital status: Married    Spouse name:   Information systems manager   Number of children: Not on file   Years of education: Not on file   Highest education level: Not on file  Occupational History   Occupation: Not employed  Tobacco Use   Smoking status: Former    Packs/day: 0.00    Types: Cigarettes   Smokeless tobacco: Never  Vaping Use   Vaping Use: Some days  Substance and Sexual Activity   Alcohol use: Not Currently   Drug use: No   Sexual activity: Yes    Birth control/protection: I.U.D.  Other Topics Concern   Not on file  Social History Narrative   Lives in [place]; with daughter/room-mate. No smoking; no alcohol. On retirement disability.    Social Determinants of Health   Financial Resource Strain: Not on file  Food Insecurity: Not on file  Transportation Needs: Not on file  Physical Activity: Not on file  Stress: Not on file  Social Connections: Not on file    Allergies: No Known Allergies  Metabolic Disorder Labs: No results found for: HGBA1C, MPG No results found for: PROLACTIN Lab Results  Component Value Date   CHOL 204 (H) 02/12/2017   TRIG 327 (H) 11/13/2019   HDL 53 02/12/2017   CHOLHDL 3.8 02/12/2017   LDLCALC 138 (H) 02/12/2017   Lab Results  Component Value Date   TSH 3.040 02/12/2017    Therapeutic Level Labs: No results found for: LITHIUM No results found for: VALPROATE No components found for:  CBMZ  Current Medications: Current Outpatient Medications  Medication Sig Dispense Refill   acetaminophen (TYLENOL) 500 MG tablet Take 500 mg by mouth every 6 (six) hours as needed.     ARIPiprazole (ABILIFY) 2 MG tablet Take 1 tablet (2 mg total) by mouth daily. 30 tablet 0   balsalazide (COLAZAL) 750 MG capsule Take 2,250 mg by mouth 3 (three) times daily. (Patient not taking: Reported on 03/04/2021)     DULoxetine (CYMBALTA) 30 MG capsule Take 1 capsule (30 mg total) by mouth daily. 90 capsule 0   hydrOXYzine (VISTARIL) 25 MG capsule Take 1 capsule (25 mg total) by mouth 3 (three) times daily as  needed for anxiety. 90 capsule 1   No current facility-administered medications for this visit.     Musculoskeletal: Strength & Muscle Tone:  UTA Gait & Station:  Seated Patient leans: N/A  Psychiatric Specialty Exam: Review of Systems  Constitutional:  Positive for fatigue.  HENT:  Positive for congestion.   Psychiatric/Behavioral:  Positive for decreased concentration, dysphoric mood and sleep disturbance. The patient is nervous/anxious.   All other systems reviewed and are negative.  currently breastfeeding.There is no height or weight on file to calculate BMI.  General Appearance: Casual  Eye Contact:  Fair  Speech:  Clear and Coherent  Volume:  Normal  Mood:  Anxious and Depressed  Affect:  Congruent  Thought Process:  Goal Directed and Descriptions of Associations: Intact  Orientation:  Full (Time, Place, and Person)  Thought Content: Logical   Suicidal Thoughts:  No  Homicidal Thoughts:  No  Memory:  Immediate;   Fair Recent;   Fair Remote;   Fair  Judgement:  Fair  Insight:  Fair  Psychomotor Activity:  Normal  Concentration:  Concentration: Fair and Attention Span: Fair  Recall:  [employer] of Knowledge: Fair  Language: Fair  Akathisia:  No  Handed:  Right  AIMS (if indicated): not done  Assets:  Communication Skills Desire for Improvement Housing Social Support  ADL's:  Intact  Cognition: WNL  Sleep:  Poor   Screenings: GAD-7    Flowsheet Row Video Visit from 03/27/2021 in [facility] Video Visit from 10/23/2020 in [facility] Routine Prenatal from 10/18/2019 in [facility]  Total GAD-7 Score 19 15 15       PHQ2-9    Flowsheet Row Video Visit from 03/27/2021 in [facility] Video Visit from 03/06/2021 in [facility] from 12/21/2019 in [facility] Appointment from 10/26/2019 in [facility] Routine Prenatal from 10/18/2019 in [facility]  PHQ-2 Total Score 2 1 2 3 6   PHQ-9 Total Score 11 9 15  -- 19      Flowsheet Row Admission (Discharged) from 11/26/2020 in [facility]  C-SSRS RISK CATEGORY No Risk        Assessment and Plan: Stephanie Ayala is a 25 year old Caucasian female, separated, lives in [place], has a history of MDD, GAD was evaluated by telemedicine today.  Patient continues to struggle with anxiety, depression, sleep problems, has been noncompliant with psychotherapy recommendations as well as medication recommendations.  Discussed plan as noted below.  Plan MDD-unstable We will reduce Cymbalta to 30 mg p.o. daily-due to recent elevated LFTs.  Patient continues to take the Cymbalta 60 mg daily.  Agrees to pick it up from the pharmacy. We will start Abilify 2 mg p.o. daily in the morning, advised to hold it off until her upper respiratory tract infection symptoms resolved. [facility] per her request from patient. Patient has been noncompliant with CBT-due to health insurance problems.  GAD-unstable Hydroxyzine 25 mg p.o. daily as needed for anxiety symptoms Patient advised to establish care with a therapist Cymbalta as prescribed  Bereavement-improving We will monitor closely  At risk for prolonged QT syndrome-reviewed and discussed EKG-03/13/2021-normal sinus rhythm, QTC 418.  Provided information for [facility] health services for medication management as well as psychotherapy sessions since she currently has health insurance problem.  Follow-up in clinic in 3 weeks or sooner if needed.  This note was generated in part or whole with voice recognition software. Voice recognition is usually quite accurate but there are transcription errors that can and very often do occur. I apologize for any typographical errors that were not detected and  corrected.      Ursula Alert, MD 03/27/2021, 5:01 PM

## 2021-04-02 DIAGNOSIS — E669 Obesity, unspecified: Secondary | ICD-10-CM | POA: Insufficient documentation

## 2021-04-22 ENCOUNTER — Other Ambulatory Visit: Payer: Self-pay

## 2021-04-22 ENCOUNTER — Telehealth (INDEPENDENT_AMBULATORY_CARE_PROVIDER_SITE_OTHER): Payer: Self-pay | Admitting: Psychiatry

## 2021-04-22 DIAGNOSIS — F331 Major depressive disorder, recurrent, moderate: Secondary | ICD-10-CM

## 2021-04-22 NOTE — Progress Notes (Signed)
No response to call or text or video invite  

## 2021-05-04 ENCOUNTER — Emergency Department: Payer: Medicaid Other

## 2021-05-04 ENCOUNTER — Other Ambulatory Visit: Payer: Self-pay

## 2021-05-04 ENCOUNTER — Encounter: Payer: Self-pay | Admitting: Gastroenterology

## 2021-05-04 ENCOUNTER — Emergency Department
Admission: EM | Admit: 2021-05-04 | Discharge: 2021-05-04 | Disposition: A | Discharge status: Home / Self Care | Payer: Medicaid Other | Attending: Emergency Medicine | Admitting: Emergency Medicine

## 2021-05-04 DIAGNOSIS — Z20822 Contact with and (suspected) exposure to covid-19: Secondary | ICD-10-CM | POA: Diagnosis not present

## 2021-05-04 DIAGNOSIS — O219 Vomiting of pregnancy, unspecified: Secondary | ICD-10-CM | POA: Diagnosis present

## 2021-05-04 DIAGNOSIS — Z3A01 Less than 8 weeks gestation of pregnancy: Secondary | ICD-10-CM | POA: Diagnosis not present

## 2021-05-04 DIAGNOSIS — N9489 Other specified conditions associated with female genital organs and menstrual cycle: Secondary | ICD-10-CM | POA: Diagnosis not present

## 2021-05-04 DIAGNOSIS — R1030 Lower abdominal pain, unspecified: Secondary | ICD-10-CM

## 2021-05-04 DIAGNOSIS — Z3490 Encounter for supervision of normal pregnancy, unspecified, unspecified trimester: Secondary | ICD-10-CM

## 2021-05-04 HISTORY — DX: Unspecified osteoarthritis, unspecified site: M19.90

## 2021-05-04 LAB — RESP PANEL BY RT-PCR (FLU A&B, COVID) ARPGX2
Influenza A by PCR: NEGATIVE
Influenza B by PCR: NEGATIVE
SARS Coronavirus 2 by RT PCR: NEGATIVE

## 2021-05-04 LAB — CBC
HCT: 41.5 % (ref 36.0–46.0)
Hemoglobin: 13.7 g/dL (ref 12.0–15.0)
MCH: 28.2 pg (ref 26.0–34.0)
MCHC: 33 g/dL (ref 30.0–36.0)
MCV: 85.6 fL (ref 80.0–100.0)
Platelets: 380 10*3/uL (ref 150–400)
RBC: 4.85 MIL/uL (ref 3.87–5.11)
RDW: 13.5 % (ref 11.5–15.5)
WBC: 8.2 10*3/uL (ref 4.0–10.5)
nRBC: 0 % (ref 0.0–0.2)

## 2021-05-04 LAB — COMPREHENSIVE METABOLIC PANEL
ALT: 35 U/L (ref 0–44)
AST: 29 U/L (ref 15–41)
Albumin: 3.4 g/dL — ABNORMAL LOW (ref 3.5–5.0)
Alkaline Phosphatase: 72 U/L (ref 38–126)
Anion gap: 7 (ref 5–15)
BUN: 14 mg/dL (ref 6–20)
CO2: 22 mmol/L (ref 22–32)
Calcium: 8.2 mg/dL — ABNORMAL LOW (ref 8.9–10.3)
Chloride: 106 mmol/L (ref 98–111)
Creatinine, Ser: 0.63 mg/dL (ref 0.44–1.00)
GFR, Estimated: >60 mL/min (ref >60)
Glucose, Bld: 118 mg/dL — ABNORMAL HIGH (ref 70–99)
Potassium: 3.6 mmol/L (ref 3.5–5.1)
Sodium: 135 mmol/L (ref 135–145)
Total Bilirubin: 1.8 mg/dL — ABNORMAL HIGH (ref 0.3–1.2)
Total Protein: 6.9 g/dL (ref 6.5–8.1)

## 2021-05-04 LAB — LIPASE, BLOOD: Lipase: 23 U/L (ref 11–51)

## 2021-05-04 LAB — HCG, QUANTITATIVE, PREGNANCY: hCG, Beta Chain, Quant, S: 6230 m[IU]/mL — ABNORMAL HIGH (ref <5)

## 2021-05-04 MED ORDER — DOXYLAMINE SUCCINATE (SLEEP) 25 MG PO TABS: 12.5000 mg | ORAL_TABLET | Freq: Four times a day (QID) | ORAL | 0 refills | Status: DC | PRN

## 2021-05-04 MED ORDER — SODIUM CHLORIDE 0.9 % IV BOLUS
1000.0000 mL | Freq: Once | INTRAVENOUS | Status: AC
  Administered 2021-05-04: 1000 mL via INTRAVENOUS

## 2021-05-04 MED ORDER — ONDANSETRON HCL 4 MG/2ML IJ SOLN
4.0000 mg | Freq: Once | INTRAMUSCULAR | Status: AC
Start: 2021-05-04 — End: 2021-05-04
  Administered 2021-05-04: 4 mg via INTRAVENOUS
  Filled 2021-05-04: qty 2

## 2021-05-04 NOTE — ED Triage Notes (Signed)
Pt c/o of nausea for the last couple weeks and vomiting that started today- pt also has diarrhea but states it is a flair up of her UC- pt states she is having dizzy spells and feels like she is dehydrated

## 2021-05-04 NOTE — ED Provider Notes (Signed)
[facility] Provider Note    Event Date/Time   First MD Initiated Contact with Patient 05/04/21 1102     (approximate)   History   Emesis   HPI  SHENICA HOLZHEIMER is a 31 y.o. female  with UC and reports two weeks ago having nausea and then yesterday developed nausea and vomiting yesterday.  Pt reports previously taking balsalazide for UC but has not been taking it.  She states that it really was not working and she followed up with Dr. Marius Ditch and they were trying to switch which she was going to be on but her insurance was having issues covering the labs that they needed done.  She reports her abdominal pain is similar to what she had been having previously but as for the vomiting today that was new.  Also reports dysuria. No fevers. Not on OCPS. Last period a little over a month ago. No one else sick at home.    Physical Exam   Triage Vital Signs: ED Triage Vitals  Enc Vitals Group     BP 05/04/21 1109 107/62     Pulse Rate 05/04/21 1109 (!) 109     Resp 05/04/21 1109 20     Temp 05/04/21 1109 99.1 F (37.3 C)     Temp Source 05/04/21 1109 Oral     SpO2 05/04/21 1109 97 %     Weight 05/04/21 1110 200 lb (90.7 kg)     Height 05/04/21 1110 5' 2"  (1.575 m)     Head Circumference --      Peak Flow --      Pain Score 05/04/21 1110 4     Pain Loc --      Pain Edu? --      Excl. in [place]? --     Most recent vital signs: Vitals:   05/04/21 1109  BP: 107/62  Pulse: (!) 109  Resp: 20  Temp: 99.1 F (37.3 C)  SpO2: 97%     General: Awake, no distress.  CV:  Good peripheral perfusion. tachy Resp:  Normal effort.  Abd:  No distention.  Soft and nontender Other:     ED Results / Procedures / Treatments   Labs (all labs ordered are listed, but only abnormal results are displayed) Labs Reviewed  COMPREHENSIVE METABOLIC PANEL - Abnormal; Notable for the following components:      Result Value   Glucose, Bld 118 (*)    Calcium 8.2 (*)    Albumin  3.4 (*)    Total Bilirubin 1.8 (*)    All other components within normal limits  RESP PANEL BY RT-PCR (FLU A&B, COVID) ARPGX2  LIPASE, BLOOD  CBC  URINALYSIS, ROUTINE W REFLEX MICROSCOPIC  HCG, QUANTITATIVE, PREGNANCY  POC URINE PREG, ED     EKG  My interpretation of EKG: Normal sinus rate of 99, no ST elevation, T wave flattening lead III, normal intervals    RADIOLOGY I have reviewed the [place] personally and agree with radiology read with gestational sac with no fetal cardiac activity at   PROCEDURES:  Critical Care performed: No  .1-3 Lead EKG Interpretation Performed by: Vanessa Little [place]m, MD Authorized by: Vanessa Avilla, MD     Interpretation: normal     ECG rate:  90   ECG rate assessment: normal     Rhythm: sinus rhythm     Ectopy: none     Conduction: normal     MEDICATIONS ORDERED IN ED: Medications  sodium chloride 0.9 % bolus 1,000 mL (0 mLs Intravenous Stopped 05/04/21 1309)  ondansetron (ZOFRAN) injection 4 mg (4 mg Intravenous Given 05/04/21 1146)     IMPRESSION / MDM / ASSESSMENT AND PLAN / ED COURSE  I reviewed the triage vital signs and the nursing notes.                             Patient with ulcerative colitis who comes in with nausea, vomiting Differential diagnosis includes, but is not limited to, flu, COVID, UTI.  Patient reports some generalized abdominal cramping but on palpation she is nontender.  She has no right lower quadrant tenderness to suggest appendicitis  hCG was positive therefore will get transvaginal ultrasound to make sure no evidence of ectopic COVID, flu are negative Lipase normal CMP normal except for slightly elevated total bilirubin but patient did not have any right upper quadrant tenderness.  She had prior ultrasound with hepatic steatosis CBC is normal  Discussed with patient the ultrasound results    IMPRESSION: There is a gestational sac within the fundus containing yolk sac without fetal pole or fetal cardiac  activity suggesting early intrauterine pregnancy. By sac measurement, estimated gestational age is 5 weeks 2 days. Serial HCG estimations and follow-up sonogram as warranted should be considered.  She reports that she can follow-up with [facility].  I recommended urine to make sure no asymptomatic bacteria but patient is declining.  She is understands that there is a risk of missing an infection that could cause issues with her pregnancy and that typically rescreen for this.  However patient states that she just went to the bathroom when she was in ultrasound and she does not feel like she can go again and she would really like to go home at this time.  She is going to call OB/GYN tomorrow to get follow-up  She wants to start off with doxylamine and B6 for her nausea  The patient is on the cardiac monitor to evaluate for evidence of arrhythmia and/or significant heart rate changes.   FINAL CLINICAL IMPRESSION(S) / ED DIAGNOSES   Final diagnoses:  Pregnant  Less than [redacted] weeks gestation of pregnancy     Rx / DC Orders   ED Discharge Orders          Ordered    doxylamine, Sleep, (UNISOM) 25 MG tablet  Every 6 hours PRN        05/04/21 1418             Note:  This document was prepared using Dragon voice recognition software and may include unintentional dictation errors.   Vanessa Conetoe, MD 05/04/21 [phone]

## 2021-05-04 NOTE — Discharge Instructions (Addendum)
I suspect this is nausea and vomiting related to pregnancy.  Your ultrasound is below.  You start off taking vitamin B6 which can get over-the-counter with some doxylamine.  You may also be able to get this over-the-counter as well but I written you for a prescription.  These are not working we can discuss further with your OB/GYN.  I recommended a urine test but you want to leave prior to it being done  There is a gestational sac within the fundus containing yolk sac  without fetal pole or fetal cardiac activity suggesting early  intrauterine pregnancy. By sac measurement, estimated gestational  age is 5 weeks 2 days. Serial HCG estimations and follow-up sonogram  as warranted should be considered.

## 2021-05-05 ENCOUNTER — Ambulatory Visit: Payer: Medicaid Other | Admitting: Obstetrics and Gynecology

## 2021-05-05 ENCOUNTER — Encounter: Payer: Self-pay | Admitting: Obstetrics and Gynecology

## 2021-05-05 VITALS — BP 114/70 | Ht 60.2 in | Wt 205.4 lb

## 2021-05-05 DIAGNOSIS — Z3490 Encounter for supervision of normal pregnancy, unspecified, unspecified trimester: Secondary | ICD-10-CM | POA: Diagnosis not present

## 2021-05-05 NOTE — Patient Instructions (Signed)
First Trimester of Pregnancy The first trimester of pregnancy starts on the first day of your last menstrual period until the end of week 12. This is months 1 through 3 of pregnancy. A week after a sperm fertilizes an egg, the egg will implant into the wall of the uterus and begin to develop into a baby. By the end of 12 weeks, all the baby's organs will be formed and the baby will be 2-3 inches in size. Body changes during your first trimester Your body goes through many changes during pregnancy. The changes vary and generally return to normal after your baby is born. Physical changes You may gain or lose weight. Your breasts may begin to grow larger and become tender. The tissue that surrounds your nipples (areola) may become darker. Dark spots or blotches (chloasma or mask of pregnancy) may develop on your face. You may have changes in your hair. These can include thickening or thinning of your hair or changes in texture. Health changes You may feel nauseous, and you may vomit. You may have heartburn. You may develop headaches. You may develop constipation. Your gums may bleed and may be sensitive to brushing and flossing. Other changes You may tire easily. You may urinate more often. Your menstrual periods will stop. You may have a loss of appetite. You may develop cravings for certain kinds of food. You may have changes in your emotions from day to day. You may have more vivid and strange dreams. Follow these instructions at home: Medicines Follow your health care provider's instructions regarding medicine use. Specific medicines may be either safe or unsafe to take during pregnancy. Do not take any medicines unless told to by your health care provider. Take a prenatal vitamin that contains at least 600 micrograms (mcg) of folic acid. Eating and drinking Eat a healthy diet that includes fresh fruits and vegetables, whole grains, good sources of protein such as meat, eggs, or tofu,  and low-fat dairy products. Avoid raw meat and unpasteurized juice, milk, and cheese. These carry germs that can harm you and your baby. If you feel nauseous or you vomit: Eat 4 or 5 small meals a day instead of 3 large meals. Try eating a few soda crackers. Drink liquids between meals instead of during meals. You may need to take these actions to prevent or treat constipation: Drink enough fluid to keep your urine pale yellow. Eat foods that are high in fiber, such as beans, whole grains, and fresh fruits and vegetables. Limit foods that are high in fat and processed sugars, such as fried or sweet foods. Activity Exercise only as directed by your health care provider. Most people can continue their usual exercise routine during pregnancy. Try to exercise for 30 minutes at least 5 days a week. Stop exercising if you develop pain or cramping in the lower abdomen or lower back. Avoid exercising if it is very hot or humid or if you are at high altitude. Avoid heavy lifting. If you choose to, you may have sex unless your health care provider tells you not to. Relieving pain and discomfort Wear a good support bra to relieve breast tenderness. Rest with your legs elevated if you have leg cramps or low back pain. If you develop bulging veins (varicose veins) in your legs: Wear support hose as told by your health care provider. Elevate your feet for 15 minutes, 3-4 times a day. Limit salt in your diet. Safety Wear your seat belt at all times when driving  or riding in a car. Talk with your health care provider if someone is verbally or physically abusive to you. Talk with your health care provider if you are feeling sad or have thoughts of hurting yourself. Lifestyle Do not use hot tubs, steam rooms, or saunas. Do not douche. Do not use tampons or scented sanitary pads. Do not use herbal remedies, alcohol, illegal drugs, or medicines that are not approved by your health care provider. Chemicals  in these products can harm your baby. Do not use any products that contain nicotine or tobacco, such as cigarettes, e-cigarettes, and chewing tobacco. If you need help quitting, ask your health care provider. Avoid cat litter boxes and soil used by cats. These carry germs that can cause birth defects in the baby and possibly loss of the unborn baby (fetus) by miscarriage or stillbirth. General instructions During routine prenatal visits in the first trimester, your health care provider will do a physical exam, perform necessary tests, and ask you how things are going. Keep all follow-up visits. This is important. Ask for help if you have counseling or nutritional needs during pregnancy. Your health care provider can offer advice or refer you to specialists for help with various needs. Schedule a dentist appointment. At home, brush your teeth with a soft toothbrush. Floss gently. Write down your questions. Take them to your prenatal visits. Where to find more information American Pregnancy Association: americanpregnancy.[place] and Gynecologists: PoolDevices.com.pt Office on [employer]: KeywordPortfolios.com.br Contact a health care provider if you have: Dizziness. A fever. Mild pelvic cramps, pelvic pressure, or nagging pain in the abdominal area. Nausea, vomiting, or diarrhea that lasts for 24 hours or longer. A bad-smelling vaginal discharge. Pain when you urinate. Known exposure to a contagious illness, such as chickenpox, measles, Zika virus, HIV, or hepatitis. Get help right away if you have: Spotting or bleeding from your vagina. Severe abdominal cramping or pain. Shortness of breath or chest pain. Any kind of trauma, such as from a fall or a car crash. New or increased pain, swelling, or redness in an arm or leg. Summary The first trimester of pregnancy starts on the first day of your last menstrual period until the end of week  12 (months 1 through 3). Eating 4 or 5 small meals a day rather than 3 large meals may help to relieve nausea and vomiting. Do not use any products that contain nicotine or tobacco, such as cigarettes, e-cigarettes, and chewing tobacco. If you need help quitting, ask your health care provider. Keep all follow-up visits. This is important. This information is not intended to replace advice given to you by your health care provider. Make sure you discuss any questions you have with your health care provider. Document Revised: 08/23/2019 Document Reviewed: 06/29/2019 Elsevier Patient Education  2022 [employer].

## 2021-05-05 NOTE — Telephone Encounter (Signed)
Schedule patient a mychart visit for tomorrow

## 2021-05-05 NOTE — Progress Notes (Signed)
Patient ID: Stephanie Ayala, female   DOB: January 22, 1991, 31 y.o.   MRN: [id number]  Reason for Consult: Follow-up   Referred by Shari Prows, [facility]  Subjective:     HPI:  Stephanie Ayala is a 31 y.o. female.  She is following up today.  She found out in the ER yesterday that she is pregnant.  She reports that the first day of her last menstrual period was in November.  She has been trying to conceive.  She has been undergoing a flare of her ulcerative chronic colitis.  She has been having some stomach cramps and rectal bleeding.  She has follow-up planned with her GI physician.  Gynecological History  Patient's last menstrual period was 02/05/2021.  Past Medical History:  Diagnosis Date   Arthritis    Autoimmune disease ([place])    Colitis    Depression    Fibromyalgia    Family History  Problem Relation Age of Onset   Anxiety disorder Mother    Diabetes Father    Hypertension Father    Diabetes Paternal Uncle    Heart disease Paternal Uncle    Alcohol abuse Maternal Grandfather    Heart disease Paternal Grandfather    Past Surgical History:  Procedure Laterality Date   COLONOSCOPY WITH PROPOFOL N/A 11/26/2020   Procedure: COLONOSCOPY WITH PROPOFOL;  Surgeon: Lin Landsman, MD;  Location: [facility] ENDOSCOPY;  Service: Gastroenterology;  Laterality: N/A;   OVARY SURGERY Bilateral    cyst removal    Short Social History:  Social History   Tobacco Use   Smoking status: Former    Packs/day: 0.00    Types: Cigarettes   Smokeless tobacco: Never  Substance Use Topics   Alcohol use: Not Currently    No Known Allergies  Current Outpatient Medications  Medication Sig Dispense Refill   acetaminophen (TYLENOL) 500 MG tablet Take 500 mg by mouth every 6 (six) hours as needed.     doxylamine, Sleep, (UNISOM) 25 MG tablet Take 0.5 tablets (12.5 mg total) by mouth every 6 (six) hours as needed. 30 tablet 0   DULoxetine (CYMBALTA) 30 MG capsule Take 1 capsule (30 mg total)  by mouth daily. 90 capsule 0   ARIPiprazole (ABILIFY) 2 MG tablet Take 1 tablet (2 mg total) by mouth daily. 30 tablet 0   balsalazide (COLAZAL) 750 MG capsule Take 2,250 mg by mouth 3 (three) times daily. (Patient not taking: Reported on 03/04/2021)     hydrOXYzine (VISTARIL) 25 MG capsule Take 1 capsule (25 mg total) by mouth 3 (three) times daily as needed for anxiety. 90 capsule 1   No current facility-administered medications for this visit.    Review of Systems  Constitutional: Negative for chills, fatigue, fever and unexpected weight change.  HENT: Negative for trouble swallowing.  Eyes: Negative for loss of vision.  Respiratory: Negative for cough, shortness of breath and wheezing.  Cardiovascular: Negative for chest pain, leg swelling, palpitations and syncope.  GI: Negative for abdominal pain, blood in stool, diarrhea, nausea and vomiting.  GU: Negative for difficulty urinating, dysuria, frequency and hematuria.  Musculoskeletal: Negative for back pain, leg pain and joint pain.  Skin: Negative for rash.  Neurological: Negative for dizziness, headaches, light-headedness, numbness and seizures.  Psychiatric: Negative for behavioral problem, confusion, depressed mood and sleep disturbance.       Objective:  Objective   Vitals:   05/05/21 1538  BP: 114/70  Weight: 205 lb 6.4 oz (93.2 kg)  Height:  5' 0.2" (1.529 m)   Body mass index is 39.85 kg/m.  Physical Exam Vitals and nursing note reviewed. Exam conducted with a chaperone present.  Constitutional:      Appearance: Normal appearance.  HENT:     Head: Normocephalic and atraumatic.  Eyes:     Extraocular Movements: Extraocular movements intact.     Pupils: Pupils are equal, round, and reactive to light.  Cardiovascular:     Rate and Rhythm: Normal rate and regular rhythm.  Pulmonary:     Effort: Pulmonary effort is normal.     Breath sounds: Normal breath sounds.  Abdominal:     General: Abdomen is flat.      Palpations: Abdomen is soft.  Musculoskeletal:     Cervical back: Normal range of motion.  Skin:    General: Skin is warm and dry.  Neurological:     General: No focal deficit present.     Mental Status: She is alert and oriented to person, place, and time.  Psychiatric:        Behavior: Behavior normal.        Thought Content: Thought content normal.        Judgment: Judgment normal.    Assessment/Plan:     31 year old with intrauterine pregnancy Follow-up in 2 weeks for viability ultrasound and new OB visit. Return sooner if she has any severe abdominal pain or vaginal bleeding. Is planning to initiate prenatal vitamin as well as B6 and Unisom for nausea.  More than 20 minutes were spent face to face with the patient in the room, reviewing the medical record, labs and images, and coordinating care for the patient. The plan of management was discussed in detail and counseling was provided.     Adrian Prows MD [facility] 05/05/2021 3:43 PM

## 2021-05-06 ENCOUNTER — Encounter: Payer: Self-pay | Admitting: Gastroenterology

## 2021-05-06 ENCOUNTER — Other Ambulatory Visit: Payer: Self-pay

## 2021-05-06 ENCOUNTER — Telehealth (INDEPENDENT_AMBULATORY_CARE_PROVIDER_SITE_OTHER): Payer: Medicaid Other | Admitting: Gastroenterology

## 2021-05-06 DIAGNOSIS — K51011 Ulcerative (chronic) pancolitis with rectal bleeding: Secondary | ICD-10-CM

## 2021-05-06 DIAGNOSIS — K51911 Ulcerative colitis, unspecified with rectal bleeding: Secondary | ICD-10-CM | POA: Diagnosis not present

## 2021-05-06 MED ORDER — MESALAMINE ER 0.375 G PO CP24: 1500.0000 mg | ORAL_CAPSULE | Freq: Two times a day (BID) | ORAL | 2 refills | Status: DC

## 2021-05-06 MED ORDER — BUDESONIDE ER 9 MG PO TB24: 9.0000 mg | ORAL_TABLET | Freq: Every day | ORAL | 0 refills | Status: DC

## 2021-05-06 NOTE — Progress Notes (Signed)
Stephanie Sear, MD [address]  [place], [place] [address]  Main: [phone]  Fax: [phone]    Gastroenterology Consultation Video Visit  Referring Provider:     [facility] Primary Care Physician:  [facility] Primary Gastroenterologist:  Dr. Cephas Darby Reason for Consultation:     Exacerbation of ulcerative colitis        HPI:   Stephanie Ayala is a 31 y.o. female referred by Dr. Shari Prows, [facility]  for consultation & management of exacerbation of ulcerative colitis  Virtual Visit Video Note  I connected with Stephanie Ayala on 05/06/21 at  1:30 PM EST by video and verified that I am speaking with the correct person using two identifiers.   I discussed the limitations, risks, security and privacy concerns of performing an evaluation and management service by video and the availability of in person appointments. I also discussed with the patient that there may be a patient responsible charge related to this service. The patient expressed understanding and agreed to proceed.  Location of the Patient: Home  Location of the provider: Office  Persons participating in the visit: Patient and provider only   History of Present Illness: Stephanie Ayala is a 53 year old Caucasian female with a history of ulcerative colitis, diagnosed in 2018 with chronic active colitis in cecum as well as in the rectum.  Patient was originally seen by me in 10/2020, underwent repeat colonoscopy which revealed moderate active chronic colitis in the cecum as well as in the rectum and inactive colitis in rest of the colon.  She was temporarily on prednisone, discontinued due to mood swings and irritability.  She was also on balsalazide 750 mg 3 times daily which did not help with symptoms, therefore discontinued it.  I discussed with her regarding initiation of Biologics, however her LFTs were elevated which I felt were secondary to underlying fatty  liver, excess Tylenol use.  Patient stopped Tylenol use and also decreased dose of Cymbalta from 90 mg to 30 mg.  Most recent LFTs from 2/5 came back normal except for mildly elevated total bilirubin.  Patient made an urgent video visit due to worsening of her GI symptoms.  Patient reports that for the past 2 weeks she has been experiencing severe diarrhea, mixed with blood, sometimes associated with clots, worse after eating, up to 6 times daily associated with abdominal cramps.  She also experienced nausea and vomiting, felt dehydrated.  In the ER, she was confirmed to be pregnant based on the ultrasound and serum hCG levels.  Her labs revealed normal CBC, lipase, negative COVID.  Patient is now [redacted] weeks pregnant.  Patient had a visit with Dr. Gilman Schmidt, OB/GYN yesterday.  Has a follow-up appointment with CNM on 2/20   NSAIDs: None  Antiplts/Anticoagulants/Anti thrombotics: None  GI Procedures:  Colonoscopy 2018, report not available Pathology revealed chronic active colitis in cecum, chronic active proctitis and rectum   Colonoscopy 11/26/2020 - The examined portion of the ileum was normal. Biopsied. - Localized moderate inflammation was found in the cecum secondary to colitis. Biopsied. - Normal mucosa in the recto-sigmoid colon, in the sigmoid colon, in the descending colon, in the transverse colon and in the ascending colon. Biopsied. - Diffuse moderate inflammation was found in the distal rectum secondary to proctitis ulcerative colitis. Biopsied. - One 5 mm polyp in the descending colon, removed with a cold snare. Resected and retrieved.   DIAGNOSIS:  A. TERMINAL ILEUM, RANDOM; COLD BIOPSY:  - ENTERIC MUCOSA WITH NORMAL VILLOUS ARCHITECTURE AND REACTIVE LYMPHOID  HYPERPLASIA.  - NEGATIVE FOR ACTIVE ILEITIS.  - NEGATIVE FOR GRANULOMA, DYSPLASIA, AND MALIGNANCY.   B. COLON, RANDOM CECUM; COLD BIOPSY:  - CHRONIC COLITIS WITH MILD TO MODERATE ACTIVITY (CRYPTITIS AND FOCAL  CRYPT  DISRUPTION).  - NEGATIVE FOR GRANULOMA, DYSPLASIA, AND MALIGNANCY.   C. COLON, RANDOM ASCENDING; COLD BIOPSY:  - CHRONIC COLITIS WITHOUT ACTIVITY.  - NEGATIVE FOR GRANULOMA, DYSPLASIA, AND MALIGNANCY.   D. COLON, RANDOM TRANSVERSE; COLD BIOPSY:  - CHRONIC COLITIS WITHOUT ACTIVITY.  - NEGATIVE FOR GRANULOMA, DYSPLASIA, AND MALIGNANCY.   E. COLON, RANDOM DESCENDING; COLD BIOPSY:  - CHRONIC COLITIS WITHOUT ACTIVITY.  - NEGATIVE FOR GRANULOMA, DYSPLASIA, AND MALIGNANCY.   F. COLON POLYP, DESCENDING; COLD SNARE:  - SESSILE SERRATED POLYP.  - NEGATIVE FOR DYSPLASIA AND MALIGNANCY.   G. COLON, RANDOM SIGMOID; COLD BIOPSY:  - CHRONIC COLITIS WITHOUT ACTIVITY.  - NEGATIVE FOR GRANULOMA, DYSPLASIA, AND MALIGNANCY.   H. 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  - NEGATIVE FOR GRANULOMA, DYSPLASIA, AND MALIGNANCY.   Past Medical History:  Diagnosis Date   Arthritis    Autoimmune disease ([place])    Colitis    Depression    Fibromyalgia     Past Surgical History:  Procedure Laterality Date   COLONOSCOPY WITH PROPOFOL N/A 11/26/2020   Procedure: COLONOSCOPY WITH PROPOFOL;  Surgeon: Lin Landsman, MD;  Location: [facility] ENDOSCOPY;  Service: Gastroenterology;  Laterality: N/A;   OVARY SURGERY Bilateral    cyst removal    Current Outpatient Medications:    Budesonide ER (UCERIS) 9 MG TB24, Take 9 mg by mouth daily., Disp: 30 tablet, Rfl: 0   mesalamine (APRISO) 0.375 g 24 hr capsule, Take 4 capsules (1.5 g total) by mouth in the morning and at bedtime., Disp: 240 capsule, Rfl: 2   acetaminophen (TYLENOL) 500 MG tablet, Take 500 mg by mouth every 6 (six) hours as needed., Disp: , Rfl:    doxylamine, Sleep, (UNISOM) 25 MG tablet, Take 0.5 tablets (12.5 mg total) by mouth every 6 (six) hours as needed., Disp: 30 tablet, Rfl: 0   DULoxetine (CYMBALTA) 30 MG capsule, Take 1 capsule (30 mg total) by mouth daily., Disp: 90  capsule, Rfl: 0    Family History  Problem Relation Age of Onset   Anxiety disorder Mother    Diabetes Father    Hypertension Father    Diabetes Paternal Uncle    Heart disease Paternal Uncle    Alcohol abuse Maternal Grandfather    Heart disease Paternal Grandfather      Social History   Tobacco Use   Smoking status: Former    Packs/day: 0.00    Types: Cigarettes   Smokeless tobacco: Never  Vaping Use   Vaping Use: Some days  Substance Use Topics   Alcohol use: Not Currently   Drug use: No    Allergies as of 05/06/2021   (No Known Allergies)    Imaging Studies: Reviewed  Assessment and Plan:   Stephanie Ayala is a 31 y.o. female with history of depression, history of ulcerative pancolitis currently not on any maintenance therapy, [redacted] weeks pregnant is seen as an urgent video visit for exacerbation of ulcerative colitis.  Patient currently is experiencing severe bloody diarrhea, worse postprandial associated with abdominal cramps  Recommend GI profile PCR to rule out any infection Check fecal calprotectin  levels, previously normal in 11/2020 Start budesonide 9 mg daily short-term to treat flareup Discussed with patient regarding concern for progression of ulcerative colitis during pregnancy and significance of getting it under control in order to have uneventful pregnancy and to decrease the risk of low birthweight baby, preterm pregnancy etc.  Ideally, patient will need biologic therapy to be initiated ASAP.  She currently has mild to moderate active disease in the cecum as well as in the rectum.  Therefore, will try Apriso 1.5 g twice daily for about a month.  If she does not respond, I recommend to start Entyvio. Recommend referral to OB/GYN at a tertiary care facility who has experience in managing pregnancy with IBD and patient is agreeable  Follow Up Instructions:   I discussed the assessment and treatment plan with the patient. The patient was provided an opportunity  to ask questions and all were answered. The patient agreed with the plan and demonstrated an understanding of the instructions.   The patient was advised to call back or seek an in-person evaluation if the symptoms worsen or if the condition fails to improve as anticipated.  I provided 25 minutes of face-to-face time during this encounter.   Follow up in 4 weeks   Cephas Darby, MD

## 2021-05-08 ENCOUNTER — Telehealth: Payer: Self-pay | Admitting: Gastroenterology

## 2021-05-08 ENCOUNTER — Encounter: Payer: Self-pay | Admitting: Psychiatry

## 2021-05-08 ENCOUNTER — Other Ambulatory Visit: Payer: Self-pay

## 2021-05-08 ENCOUNTER — Telehealth (INDEPENDENT_AMBULATORY_CARE_PROVIDER_SITE_OTHER): Payer: Medicaid Other | Admitting: Psychiatry

## 2021-05-08 DIAGNOSIS — Z634 Disappearance and death of family member: Secondary | ICD-10-CM | POA: Diagnosis not present

## 2021-05-08 DIAGNOSIS — Z91199 Patient's noncompliance with other medical treatment and regimen due to unspecified reason: Secondary | ICD-10-CM

## 2021-05-08 DIAGNOSIS — F411 Generalized anxiety disorder: Secondary | ICD-10-CM

## 2021-05-08 DIAGNOSIS — F331 Major depressive disorder, recurrent, moderate: Secondary | ICD-10-CM | POA: Diagnosis not present

## 2021-05-08 NOTE — Progress Notes (Signed)
Virtual Visit via Video Note  I connected with Stephanie Ayala on 05/08/21 at  8:30 AM EST by a video enabled telemedicine application and verified that I am speaking with the correct person using two identifiers.  Location Provider Location : [facility] Patient Location : Home  Participants: Patient , Provider   I discussed the limitations of evaluation and management by telemedicine and the availability of in person appointments. The patient expressed understanding and agreed to proceed.   I discussed the assessment and treatment plan with the patient. The patient was provided an opportunity to ask questions and all were answered. The patient agreed with the plan and demonstrated an understanding of the instructions.   The patient was advised to call back or seek an in-person evaluation if the symptoms worsen or if the condition fails to improve as anticipated.    [place] MD OP Progress Note  05/08/2021 6:16 PM Stephanie Ayala  MRN:  [id number]  Chief Complaint:  Chief Complaint   Follow-up 31 year old Caucasian female with history of MDD, GAD, bereavement, likely pregnant, presents for medication management.    HPI: Stephanie Ayala is a 31 year old Caucasian female, employed, married, lives in [place], has a history of depression, anxiety, was evaluated by telemedicine today.  Patient presented very late for her appointment today.  Patient reports she is likely pregnant, and has upcoming appointment with her OB/GYN.  Patient reports she is likely 5 weeks at this time.  Patient reports she does have some fatigue, low energy however overall she has been managing well.  She reports depression is better.  She however continues to have anxiety symptoms.  Worries about different things on a regular basis.  Currently noncompliant on Abilify.  Reports she did not want to start the Abilify since she was worried about it affecting her pregnancy.  She continues to take the duloxetine.  Reports she was on  duloxetine while she was pregnant with her first child.  She wants to stay on the duloxetine and is aware about pregnancy implications of the medication.  Coping better with her grandmother's loss.  Agreeable to start psychotherapy sessions for anxiety symptoms.  Denies any suicidality, homicidality or perceptual disturbances.  Sleep is restless mostly because she needs to be up helping her baby.  She reports her husband is currently sick and  that has been a stressor .        Visit Diagnosis:    ICD-10-CM   1. MDD (major depressive disorder), recurrent episode, moderate (HCC)  F33.1     2. GAD (generalized anxiety disorder)  F41.1     3. Bereavement  Z63.4     4. Noncompliance with treatment plan  Z91.199       Past Psychiatric History: Reviewed past psychiatric history from progress note on 12/21/2019.  Past Medical History:  Past Medical History:  Diagnosis Date   Arthritis    Autoimmune disease ([place])    Colitis    Depression    Fibromyalgia     Past Surgical History:  Procedure Laterality Date   COLONOSCOPY WITH PROPOFOL N/A 11/26/2020   Procedure: COLONOSCOPY WITH PROPOFOL;  Surgeon: Lin Landsman, MD;  Location: [facility];  Service: Gastroenterology;  Laterality: N/A;   OVARY SURGERY Bilateral    cyst removal    Family Psychiatric History: Reviewed family psychiatric history from progress note on 12/21/2019.  Family History:  Family History  Problem Relation Age of Onset   Anxiety disorder Mother    Diabetes Father  Hypertension Father    Diabetes Paternal Uncle    Heart disease Paternal Uncle    Alcohol abuse Maternal Grandfather    Heart disease Paternal Grandfather     Social History: Reviewed social history from progress note on 12/21/2019. Social History   Socioeconomic History   Marital status: Married    Spouse name:  Dylan   Number of children: Not on file   Years of education: Not on file   Highest education level: Not on  file  Occupational History   Occupation: Not employed  Tobacco Use   Smoking status: Former    Packs/day: 0.00    Types: Cigarettes   Smokeless tobacco: Never  Vaping Use   Vaping Use: Some days  Substance and Sexual Activity   Alcohol use: Not Currently   Drug use: No   Sexual activity: Yes    Birth control/protection: I.U.D.  Other Topics Concern   Not on file  Social History Narrative   Lives in [place]; with daughter/room-mate. No smoking; no alcohol. On retirement disability.    Social Determinants of Health   Financial Resource Strain: Not on file  Food Insecurity: Not on file  Transportation Needs: Not on file  Physical Activity: Not on file  Stress: Not on file  Social Connections: Not on file    Allergies: No Known Allergies  Metabolic Disorder Labs: No results found for: HGBA1C, MPG No results found for: PROLACTIN Lab Results  Component Value Date   CHOL 204 (H) 02/12/2017   TRIG 327 (H) 11/13/2019   HDL 53 02/12/2017   CHOLHDL 3.8 02/12/2017   LDLCALC 138 (H) 02/12/2017   Lab Results  Component Value Date   TSH 3.040 02/12/2017    Therapeutic Level Labs: No results found for: LITHIUM No results found for: VALPROATE No components found for:  CBMZ  Current Medications: Current Outpatient Medications  Medication Sig Dispense Refill   acetaminophen (TYLENOL) 500 MG tablet Take 500 mg by mouth every 6 (six) hours as needed.     Budesonide ER (UCERIS) 9 MG TB24 Take 9 mg by mouth daily. 30 tablet 0   Cholecalciferol 125 MCG (5000 UT) capsule Take by mouth.     doxylamine, Sleep, (UNISOM) 25 MG tablet Take 0.5 tablets (12.5 mg total) by mouth every 6 (six) hours as needed. 30 tablet 0   DULoxetine (CYMBALTA) 30 MG capsule Take 1 capsule (30 mg total) by mouth daily. 90 capsule 0   mesalamine (APRISO) 0.375 g 24 hr capsule Take 4 capsules (1.5 g total) by mouth in the morning and at bedtime. 240 capsule 2   No current facility-administered medications  for this visit.     Musculoskeletal: Strength & Muscle Tone:  UTA Gait & Station:  Seated Patient leans: N/A  Psychiatric Specialty Exam: Review of Systems  Psychiatric/Behavioral:  The patient is nervous/anxious.   All other systems reviewed and are negative.  not currently breastfeeding.There is no height or weight on file to calculate BMI.  General Appearance: Casual  Eye Contact:  Fair  Speech:  Clear and Coherent  Volume:  Normal  Mood:  Anxious  Affect:  Congruent  Thought Process:  Goal Directed and Descriptions of Associations: Intact  Orientation:  Full (Time, Place, and Person)  Thought Content: Logical   Suicidal Thoughts:  No  Homicidal Thoughts:  No  Memory:  Immediate;   Fair Recent;   Fair Remote;   Fair  Judgement:  Fair  Insight:  Fair  Psychomotor Activity:  Normal  Concentration:  Concentration: Fair and Attention Span: Fair  Recall:  [employer] of Knowledge: Fair  Language: Fair  Akathisia:  No  Handed:  Right  AIMS (if indicated): not done  Assets:  Communication Skills Desire for Improvement Housing Intimacy Social Support  ADL's:  Intact  Cognition: WNL  Sleep:   restless   Screenings: GAD-7    Flowsheet Row Video Visit from 05/08/2021 in [facility] Video Visit from 03/27/2021 in [facility] Video Visit from 10/23/2020 in [facility] Routine Prenatal from 10/18/2019 in [facility]  Total GAD-7 Score 4 19 15 15       PHQ2-9    Flowsheet Row Video Visit from 05/08/2021 in [facility] Video Visit from 03/27/2021 in [facility] Video Visit from 03/06/2021 in [facility] from 12/21/2019 in [facility] Appointment from 10/26/2019 in [facility]  PHQ-2 Total Score 1 2 1 2 3   PHQ-9 Total Score 5 11 9 15  --       Flowsheet Row ED from 05/04/2021 in [facility] Admission (Discharged) from 11/26/2020 in [facility]  C-SSRS RISK CATEGORY No Risk No Risk        Assessment and Plan: Stephanie Ayala is a 31 year old Caucasian female, married, lives in [place], has a history of MDD, GAD was evaluated by telemedicine today.  Patient is currently pregnant, likely 5 weeks, has upcoming appointment with OB/GYN.  Currently reports mood symptoms as improving however agreeable to establish care with therapist.  Plan as noted below.  Plan MDD-improving Cymbalta 30 mg p.o. daily Discontinue Abilify for noncompliance Encourage compliance, provided education.  Encouraged to get established with psychotherapist.  GAD-unstable Cymbalta 30 mg p.o. daily  Bereavement-improving Will monitor closely  Noncompliance with treatment plan-patient has been noncompliant with treatment recommendation, therapy as well as follow-up recommendations.  Encouraged compliance, provided education.  Discussed pregnancy implication of her medications including Cymbalta.  Follow-up in clinic in person in 6 weeks or sooner if needed.  This note was generated in part or whole with voice recognition software. Voice recognition is usually quite accurate but there are transcription errors that can and very often do occur. I apologize for any typographical errors that were not detected and corrected.      Ursula Alert, MD 05/08/2021, 6:16 PM

## 2021-05-08 NOTE — Telephone Encounter (Signed)
-----   Message from Homero Fellers, MD sent at 05/08/2021  5:32 PM EST ----- Regarding: RE: [facility] patient Hi Dr. Marius Ditch.  That makes sense. Thank you for that recommendation. I will pass on word to my office manager to help with that.  Sincerely,  Dr. Gilman Schmidt ----- Message ----- From: Lin Landsman, MD Sent: 05/06/2021   2:54 PM EST To: Homero Fellers, MD, # Subject: [facility] patient                                 Dr Gilman Schmidt and Kari Baars saw Verlean as an urgent visit today because of exacerbation of underlying UC.  She surprised me with second pregnancy.  I am worried about her pregnancy as well as her underlying ulcerative colitis given her UC being uncontrolled.  I am going to start her on steroids and mesalamine.  I think she should be referred to [facility] if you are agreeable for comanagement of pregnancy and IBD.  She might need biologic therapy if she does not respond to prednisone and mesalamine  Thank you  Cephas Darby, MD [facility], [address]  [place], [place] [address]  Main: [phone]  Fax: [phone] Pager: [phone]

## 2021-05-12 ENCOUNTER — Telehealth: Payer: Self-pay

## 2021-05-12 NOTE — Telephone Encounter (Signed)
-----   Message from Homero Fellers, MD sent at 05/08/2021  5:32 PM EST ----- Regarding: RE: [facility] patient Hi Dr. Marius Ditch.  That makes sense. Thank you for that recommendation. I will pass on word to my office manager to help with that.  Sincerely,  Dr. Gilman Schmidt ----- Message ----- From: Lin Landsman, MD Sent: 05/06/2021   2:54 PM EST To: Homero Fellers, MD, # Subject: [facility] patient                                 Dr Gilman Schmidt and Kari Baars saw Stephanie Ayala as an urgent visit today because of exacerbation of underlying UC.  She surprised me with second pregnancy.  I am worried about her pregnancy as well as her underlying ulcerative colitis given her UC being uncontrolled.  I am going to start her on steroids and mesalamine.  I think she should be referred to [facility] if you are agreeable for comanagement of pregnancy and IBD.  She might need biologic therapy if she does not respond to prednisone and mesalamine  Thank you  Cephas Darby, MD [facility], [address]  [place], [place] [address]  Main: [phone]  Fax: [phone] Pager: [phone]

## 2021-05-12 NOTE — Telephone Encounter (Addendum)
Homero Fellers, MD  Otila Kluver, LPN Please facilitate transfer to [facility] because of her ulcerative colitis history. Thank you.  Dr. Gilman Schmidt

## 2021-05-14 ENCOUNTER — Encounter: Payer: Self-pay | Admitting: Gastroenterology

## 2021-05-16 ENCOUNTER — Ambulatory Visit
Admission: RE | Admit: 2021-05-16 | Discharge: 2021-05-16 | Disposition: A | Discharge status: Home / Self Care | Payer: Medicaid Other | Source: Ambulatory Visit | Attending: Obstetrics and Gynecology | Admitting: Obstetrics and Gynecology

## 2021-05-16 DIAGNOSIS — Z3689 Encounter for other specified antenatal screening: Secondary | ICD-10-CM | POA: Diagnosis not present

## 2021-05-16 DIAGNOSIS — Z3A01 Less than 8 weeks gestation of pregnancy: Secondary | ICD-10-CM | POA: Diagnosis not present

## 2021-05-16 DIAGNOSIS — Z3490 Encounter for supervision of normal pregnancy, unspecified, unspecified trimester: Secondary | ICD-10-CM

## 2021-05-19 ENCOUNTER — Ambulatory Visit (INDEPENDENT_AMBULATORY_CARE_PROVIDER_SITE_OTHER): Payer: Medicaid Other | Admitting: Licensed Practical Nurse

## 2021-05-19 ENCOUNTER — Other Ambulatory Visit: Payer: Self-pay

## 2021-05-19 ENCOUNTER — Encounter: Payer: Self-pay | Admitting: Licensed Practical Nurse

## 2021-05-19 VITALS — BP 118/66 | Wt 207.0 lb

## 2021-05-19 DIAGNOSIS — B3731 Acute candidiasis of vulva and vagina: Secondary | ICD-10-CM

## 2021-05-19 DIAGNOSIS — Z3A01 Less than 8 weeks gestation of pregnancy: Secondary | ICD-10-CM | POA: Diagnosis not present

## 2021-05-19 DIAGNOSIS — Z3401 Encounter for supervision of normal first pregnancy, first trimester: Secondary | ICD-10-CM | POA: Diagnosis not present

## 2021-05-19 DIAGNOSIS — N898 Other specified noninflammatory disorders of vagina: Secondary | ICD-10-CM

## 2021-05-19 DIAGNOSIS — K51919 Ulcerative colitis, unspecified with unspecified complications: Secondary | ICD-10-CM | POA: Diagnosis not present

## 2021-05-19 LAB — POCT URINALYSIS DIPSTICK OB
Glucose, UA: NEGATIVE
POC,PROTEIN,UA: NEGATIVE

## 2021-05-19 NOTE — Progress Notes (Signed)
Obstetrics & Gynecology Office Visit   Chief Complaint:  Chief Complaint  Patient presents with   Initial Prenatal Visit    3rd pregnancy     History of Present Illness:  [id number] 12w0dby LMP.  Here for NOB, but [facility]. Pt agreeable to plan.  [facility] not have NOB [facility]   Did mention she has had vaginal itiching x 2 weeks, denies odor or changes to her discharge.  Has been treated for thrush on her nipples.    Review of Systems: vaginal itching, nausea and vomiting   Past Medical History:  Past Medical History:  Diagnosis Date   Arthritis    Autoimmune disease ([facility]    Colitis    Depression    Fibromyalgia     Past Surgical History:  Past Surgical History:  Procedure Laterality Date   COLONOSCOPY WITH PROPOFOL N/A 11/26/2020   Procedure: COLONOSCOPY WITH PROPOFOL;  Surgeon: VLin Landsman MD;  Location: [facility] ENDOSCOPY;  Service: Gastroenterology;  Laterality: N/A;   OVARY SURGERY Bilateral    cyst removal    Gynecologic History: Patient's last menstrual period was 03/31/2021. Last pap 2021, WNL  Obstetric History: [id number]  Family History:  Family History  Problem Relation Age of Onset   Anxiety disorder Mother    Diabetes Father    Hypertension Father    Diabetes Paternal Uncle    Heart disease Paternal Uncle    Alcohol abuse Maternal Grandfather    Heart disease Paternal Grandfather     Social History:  Social History   Socioeconomic History   Marital status: Married    Spouse name:  DInformation systems manager  Number of children: Not on file   Years of education: Not on file   Highest education level: Not on file  Occupational History   Occupation: Not employed  Tobacco Use   Smoking status: Former    Packs/day: 0.00    Types: Cigarettes   Smokeless tobacco: Never  Vaping Use   Vaping Use: Some days  Substance and Sexual Activity   Alcohol use: Not  Currently   Drug use: No   Sexual activity: Yes    Birth control/protection: I.U.D.  Other Topics Concern   Not on file  Social History Narrative   [place]. No smoking; no alcohol. On retirement disability.    Social Determinants of Health   Financial Resource Strain: Not on file  Food Insecurity: Not on file  Transportation Needs: Not on file  Physical Activity: Not on file  Stress: Not on file  Social Connections: Not on file  Intimate Partner Violence: Not on file    Allergies:  No Known Allergies  Medications: Prior to Admission medications   Medication Sig Start Date End Date Taking? Authorizing Provider  acetaminophen (TYLENOL) 500 MG tablet Take 500 mg by mouth every 6 (six) hours as needed.   Yes [provider]  Budesonide ER (UCERIS) 9 MG TB24 Take 9 mg by mouth daily. 05/06/21 06/05/21 Yes Vanga, RTally Due MD  doxylamine, Sleep, (UNISOM) 25 MG tablet Take 0.5 tablets (12.5 mg total) by mouth every 6 (six) hours as needed. 05/04/21  Yes FVanessa [place] MD  DULoxetine (CYMBALTA) 30 MG capsule Take 1 capsule (30 mg total) by mouth daily. 03/27/21  Yes EUrsula Alert MD  mesalamine (APRISO) 0.375 g 24 hr capsule Take  4 capsules (1.5 g total) by mouth in the morning and at bedtime. 05/06/21 06/05/21 Yes Vanga, Tally Due, MD  Niacin (VITAMIN B-3 PO) Take 1 tablet by mouth.   Yes [provider]  Prenatal Vit-Fe Fumarate-FA (MULTIVITAMIN-PRENATAL) 27-0.8 MG TABS tablet Take 1 tablet by mouth daily at 12 noon.   Yes [provider]  Cholecalciferol 125 MCG (5000 UT) capsule Take by mouth. Patient not taking: Reported on 05/19/2021    [provider]    Physical Exam Vitals:  Vitals:   05/19/21 0853  BP: 118/66   Patient's last menstrual period was 03/31/2021.  General: NAD HEENT: normocephalic, anicteric Abdomen: NABS, soft, non-tender, non-distended.  Umbilicus without lesions.  No hepatomegaly,  splenomegaly or masses palpable. No evidence of hernia  Genitourinary:  External: Normal external female genitalia.  Normal urethral meatus, normal  Bartholin's and Skene's glands.    Vagina: Normal vaginal mucosa, no evidence of prolapse.    Cervix: Grossly normal in appearance, no bleeding Thick white discharge adherent to cervix and vaginal walls.  Rectal: deferred  Lymphatic: no evidence of inguinal lymphadenopathy Extremities: no edema, erythema, or tenderness Neurologic: Grossly intact Psychiatric: mood appropriate, affect full    Assessment: 31 y.o. [id number] 7weeks Vaginal yeast infection    Plan: Problem List Items Addressed This Visit   None Visit Diagnoses     Vaginal itching    -  Primary   Relevant Orders   NuSwab Vaginitis (VG)   Encounter for supervision of normal first pregnancy in first trimester       Relevant Orders   NuSwab Vaginitis (VG)      Referral to [facility] in [place] aware she can be seen or call [facility] for any concerns prior to establishing care at [facility].  Roberto Scales, CNM  [place], [facility]  05/19/21  11:24 AM

## 2021-05-19 NOTE — Telephone Encounter (Signed)
Patient seen by Roberto Scales, CNM today. Alma Friendly discussed w/patient. Patient decided on Transfer of Care. Referral faxed to [facility]. Patient aware to contact us if she has not heard from them in a few days.

## 2021-05-22 ENCOUNTER — Other Ambulatory Visit: Payer: Self-pay | Admitting: Psychiatry

## 2021-05-22 ENCOUNTER — Encounter: Payer: Self-pay | Admitting: Licensed Practical Nurse

## 2021-05-22 DIAGNOSIS — F331 Major depressive disorder, recurrent, moderate: Secondary | ICD-10-CM

## 2021-05-22 LAB — NUSWAB VAGINITIS (VG)
Candida albicans, NAA: POSITIVE — AB
Candida glabrata, NAA: NEGATIVE
Trich vag by NAA: NEGATIVE

## 2021-06-02 ENCOUNTER — Other Ambulatory Visit: Payer: Self-pay

## 2021-06-02 ENCOUNTER — Telehealth (INDEPENDENT_AMBULATORY_CARE_PROVIDER_SITE_OTHER): Payer: Medicaid Other | Admitting: Gastroenterology

## 2021-06-02 ENCOUNTER — Encounter: Payer: Self-pay | Admitting: Gastroenterology

## 2021-06-02 DIAGNOSIS — K5100 Ulcerative (chronic) pancolitis without complications: Secondary | ICD-10-CM | POA: Diagnosis not present

## 2021-06-02 NOTE — Progress Notes (Signed)
Stephanie Sear, MD [address]  [place], [place] [address]  Main: [phone]  Fax: [phone]    Gastroenterology Consultation Video Visit  Referring Provider:     Langley Gauss Primary Ca* Primary Care Physician:  Barbaraann Boys, MD Primary Gastroenterologist:  Dr. Cephas Darby Reason for Consultation:     Exacerbation of ulcerative colitis        HPI:   Stephanie Ayala is a 31 y.o. female referred by Dr. Barbaraann Boys, MD  for consultation & management of exacerbation of ulcerative colitis  Virtual Visit Video Note  I connected with Stephanie Ayala on 06/02/21 at  1:45 PM EST by video and verified that I am speaking with the correct person using two identifiers.   I discussed the limitations, risks, security and privacy concerns of performing an evaluation and management service by video and the availability of in person appointments. I also discussed with the patient that there may be a patient responsible charge related to this service. The patient expressed understanding and agreed to proceed.  Location of the Patient: Home  Location of the provider: Office  Persons participating in the visit: Patient and provider only   History of Present Illness: Stephanie Ayala is a 37 year old Caucasian female with a history of ulcerative colitis, diagnosed in 2018 with chronic active colitis in cecum as well as in the rectum.  Patient was originally seen by me in 10/2020, underwent repeat colonoscopy which revealed moderate active chronic colitis in the cecum as well as in the rectum and inactive colitis in rest of the colon.  She was temporarily on prednisone, discontinued due to mood swings and irritability.  She was also on balsalazide 750 mg 3 times daily which did not help with symptoms, therefore discontinued it.  I discussed with her regarding initiation of Biologics, however her LFTs were elevated which I felt were secondary to underlying fatty liver, excess  Tylenol use.  Patient stopped Tylenol use and also decreased dose of Cymbalta from 90 mg to 30 mg.  Most recent LFTs from 2/5 came back normal except for mildly elevated total bilirubin.  Patient made an urgent video visit due to worsening of her GI symptoms.  Patient reports that for the past 2 weeks she has been experiencing severe diarrhea, mixed with blood, sometimes associated with clots, worse after eating, up to 6 times daily associated with abdominal cramps.  She also experienced nausea and vomiting, felt dehydrated.  In the ER, she was confirmed to be pregnant based on the ultrasound and serum hCG levels.  Her labs revealed normal CBC, lipase, negative COVID.  Patient is now [redacted] weeks pregnant.  Patient had a visit with Dr. Gilman Schmidt, OB/GYN yesterday.  Has a follow-up appointment with CNM on 2/20  Follow-up visit 06/02/2021 Stephanie Ayala is a 31 year old female with mild to moderate ulcerative colitis, currently maintained on Apriso.  Patient is taking 4 pills at night.  She is not taking 4 pills in the morning.  She reports that her diarrhea has resolved.  Also, her rectal bleeding has significantly improved.  She does notice mucus per rectum occasionally.  She has an appointment at [facility], obstetrics later this month.  She does not have any GI concerns today.  She did not drop of the stool specimen yet, she just switched her insurance and currently active   NSAIDs: None  Antiplts/Anticoagulants/Anti thrombotics: None  GI Procedures:  Colonoscopy 2018, report not available Pathology revealed chronic  active colitis in cecum, chronic active proctitis and rectum   Colonoscopy 11/26/2020 - The examined portion of the ileum was normal. Biopsied. - Localized moderate inflammation was found in the cecum secondary to colitis. Biopsied. - Normal mucosa in the recto-sigmoid colon, in the sigmoid colon, in the descending colon, in the transverse colon and in the ascending colon. Biopsied. - Diffuse moderate  inflammation was found in the distal rectum secondary to proctitis ulcerative colitis. Biopsied. - One 5 mm polyp in the descending colon, removed with a cold snare. Resected and retrieved.   DIAGNOSIS:  A. TERMINAL ILEUM, RANDOM; COLD BIOPSY:  - ENTERIC MUCOSA WITH NORMAL VILLOUS ARCHITECTURE AND REACTIVE LYMPHOID  HYPERPLASIA.  - NEGATIVE FOR ACTIVE ILEITIS.  - NEGATIVE FOR GRANULOMA, DYSPLASIA, AND MALIGNANCY.   B. COLON, RANDOM CECUM; COLD BIOPSY:  - CHRONIC COLITIS WITH MILD TO MODERATE ACTIVITY (CRYPTITIS AND FOCAL  CRYPT DISRUPTION).  - NEGATIVE FOR GRANULOMA, DYSPLASIA, AND MALIGNANCY.   C. COLON, RANDOM ASCENDING; COLD BIOPSY:  - CHRONIC COLITIS WITHOUT ACTIVITY.  - NEGATIVE FOR GRANULOMA, DYSPLASIA, AND MALIGNANCY.   D. COLON, RANDOM TRANSVERSE; COLD BIOPSY:  - CHRONIC COLITIS WITHOUT ACTIVITY.  - NEGATIVE FOR GRANULOMA, DYSPLASIA, AND MALIGNANCY.   E. COLON, RANDOM DESCENDING; COLD BIOPSY:  - CHRONIC COLITIS WITHOUT ACTIVITY.  - NEGATIVE FOR GRANULOMA, DYSPLASIA, AND MALIGNANCY.   F. COLON POLYP, DESCENDING; COLD SNARE:  - SESSILE SERRATED POLYP.  - NEGATIVE FOR DYSPLASIA AND MALIGNANCY.   G. COLON, RANDOM SIGMOID; COLD BIOPSY:  - CHRONIC COLITIS WITHOUT ACTIVITY.  - NEGATIVE FOR GRANULOMA, DYSPLASIA, AND MALIGNANCY.   H. 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  - NEGATIVE FOR GRANULOMA, DYSPLASIA, AND MALIGNANCY.   Past Medical History:  Diagnosis Date   Arthritis    Autoimmune disease ([place])    Colitis    Depression    Fibromyalgia     Past Surgical History:  Procedure Laterality Date   COLONOSCOPY WITH PROPOFOL N/A 11/26/2020   Procedure: COLONOSCOPY WITH PROPOFOL;  Surgeon: Lin Landsman, MD;  Location: [facility] ENDOSCOPY;  Service: Gastroenterology;  Laterality: N/A;   OVARY SURGERY Bilateral    cyst removal    Current Outpatient Medications:    acetaminophen (TYLENOL) 500 MG  tablet, Take 500 mg by mouth every 6 (six) hours as needed., Disp: , Rfl:    doxylamine, Sleep, (UNISOM) 25 MG tablet, Take 0.5 tablets (12.5 mg total) by mouth every 6 (six) hours as needed., Disp: 30 tablet, Rfl: 0   DULoxetine (CYMBALTA) 30 MG capsule, Take 1 capsule (30 mg total) by mouth daily., Disp: 90 capsule, Rfl: 0   mesalamine (APRISO) 0.375 g 24 hr capsule, Take 4 capsules (1.5 g total) by mouth in the morning and at bedtime., Disp: 240 capsule, Rfl: 2   Niacin (VITAMIN B-3 PO), Take 1 tablet by mouth., Disp: , Rfl:    Prenatal Vit-Fe Fumarate-FA (MULTIVITAMIN-PRENATAL) 27-0.8 MG TABS tablet, Take 1 tablet by mouth daily at 12 noon., Disp: , Rfl:     Family History  Problem Relation Age of Onset   Anxiety disorder Mother    Diabetes Father    Hypertension Father    Diabetes Paternal Uncle    Heart disease Paternal Uncle    Alcohol abuse Maternal Grandfather    Heart disease Paternal Grandfather      Social History   Tobacco Use   Smoking status: Former    Packs/day: 0.00    Types: Cigarettes  Smokeless tobacco: Never  Vaping Use   Vaping Use: Some days  Substance Use Topics   Alcohol use: Not Currently   Drug use: No    Allergies as of 06/02/2021   (No Known Allergies)    Imaging Studies: Reviewed  Assessment and Plan:   DEWANA AMMIRATI is a 31 y.o. female with history of depression, history of ulcerative pancolitis [redacted] weeks pregnant is seen for follow-up of exacerbation of ulcerative colitis.  Patient was experiencing severe bloody diarrhea, worse postprandial associated with abdominal cramps  Mild to moderate ulcerative pancolitis: Currently in near complete clinical remission Patient is currently taking Apriso 1.5 g daily, advised her to increase to 3 pills twice daily given that she is still experiencing mild symptoms. Recommend to check fecal calprotectin levels, previously normal in 11/2020 Patient has been referred to OB/GYN at [facility] who has experience in  managing pregnancy with IBD, she has an appointment in late March  Follow Up Instructions:   I discussed the assessment and treatment plan with the patient. The patient was provided an opportunity to ask questions and all were answered. The patient agreed with the plan and demonstrated an understanding of the instructions.   The patient was advised to call back or seek an in-person evaluation if the symptoms worsen or if the condition fails to improve as anticipated.  I provided 25 minutes of face-to-face time during this encounter.   Follow up in 3 to 4 months   Cephas Darby, MD

## 2021-06-10 ENCOUNTER — Ambulatory Visit: Payer: Medicaid Other | Admitting: Psychiatry

## 2021-06-21 ENCOUNTER — Other Ambulatory Visit: Payer: Self-pay | Admitting: Psychiatry

## 2021-06-21 DIAGNOSIS — F331 Major depressive disorder, recurrent, moderate: Secondary | ICD-10-CM

## 2021-06-21 DIAGNOSIS — F411 Generalized anxiety disorder: Secondary | ICD-10-CM

## 2021-06-24 ENCOUNTER — Other Ambulatory Visit: Payer: Self-pay

## 2021-06-24 ENCOUNTER — Ambulatory Visit (INDEPENDENT_AMBULATORY_CARE_PROVIDER_SITE_OTHER): Payer: Medicaid Other | Admitting: Psychiatry

## 2021-06-24 ENCOUNTER — Encounter: Payer: Self-pay | Admitting: Psychiatry

## 2021-06-24 VITALS — BP 111/80 | HR 118 | Temp 98.3°F | Wt 206.0 lb

## 2021-06-24 DIAGNOSIS — F411 Generalized anxiety disorder: Secondary | ICD-10-CM | POA: Diagnosis not present

## 2021-06-24 DIAGNOSIS — M1990 Unspecified osteoarthritis, unspecified site: Secondary | ICD-10-CM | POA: Insufficient documentation

## 2021-06-24 DIAGNOSIS — F3342 Major depressive disorder, recurrent, in full remission: Secondary | ICD-10-CM

## 2021-06-24 DIAGNOSIS — Z6791 Unspecified blood type, Rh negative: Secondary | ICD-10-CM | POA: Insufficient documentation

## 2021-06-24 DIAGNOSIS — Z2839 Other underimmunization status: Secondary | ICD-10-CM | POA: Insufficient documentation

## 2021-06-24 MED ORDER — DULOXETINE HCL 30 MG PO CPEP: 30.0000 mg | ORAL_CAPSULE | Freq: Every day | ORAL | 0 refills | Status: DC

## 2021-06-24 NOTE — Patient Instructions (Signed)
[email or web address] ? ?[email or web address] ? ?

## 2021-06-24 NOTE — Progress Notes (Signed)
[place] MD OP Progress Note ? ?06/24/2021 1:56 PM ?Kemper Durie  ?MRN:  [id number] ? ?Chief Complaint:  ?Chief Complaint  ?Patient presents with  ? Follow-up: 31 year old Caucasian female with history of MDD, GAD, bereavement, ulcerative colitis with chronic active colitis in cecum as well as in the rectum, [redacted] weeks pregnant presented for medication management.  ? ?HPI: MARKA TRELOAR is a 31 year old Caucasian female, employed, married, lives in [place], has a history of depression, anxiety, ulcerative colitis with chronic active colitis, [redacted] weeks pregnant, presented for medication management. ? ?Patient today reports she continues to be under the care of OB/GYN-Dr. Schuman for her pregnancy.  She reports she is currently 14 weeks and her nausea is getting better.  She reports she continues to take doxylamine which is prescribed for the same and that helps. ? ?Patient reports overall mood symptoms are stable.  Reports she is compliant on the duloxetine.  Denies side effects. ? ?Reports she is worried about her husband who has relapsed on alcohol on weekends, she however has been coping with it.  Reports they are working on their relationship, completed family counseling.  It is getting better. ? ?Patient reports good support system from her mother who lives next door.  Her mother helps her take care of her 79-1/2-year-old daughter. ? ?Patient does report sleep problems since her daughter interrupts her sleep.  She needs to work on her sleep hygiene and has been trying to do so. ? ?Patient denies any suicidality, homicidality or perceptual disturbances. ? ?Denies any other concerns today. ? ?Visit Diagnosis:  ?  ICD-10-CM   ?1. MDD (major depressive disorder), recurrent, in full remission ([place])  F33.42 DULoxetine (CYMBALTA) 30 MG capsule  ?  ?2. GAD (generalized anxiety disorder)  F41.1 DULoxetine (CYMBALTA) 30 MG capsule  ?  ? ? ?Past Psychiatric History: Reviewed past psychiatric history from progress note on  12/21/2019. ? ?Past Medical History:  ?Past Medical History:  ?Diagnosis Date  ? Arthritis   ? Autoimmune disease ([place])   ? Colitis   ? Depression   ? Fibromyalgia   ?  ?Past Surgical History:  ?Procedure Laterality Date  ? COLONOSCOPY WITH PROPOFOL N/A 11/26/2020  ? Procedure: COLONOSCOPY WITH PROPOFOL;  Surgeon: Lin Landsman, MD;  Location: [facility] ENDOSCOPY;  Service: Gastroenterology;  Laterality: N/A;  ? OVARY SURGERY Bilateral   ? cyst removal  ? ? ?Family Psychiatric History: Reviewed family psychiatric history from progress note on 12/21/2019. ? ?Family History:  ?Family History  ?Problem Relation Age of Onset  ? Anxiety disorder Mother   ? Diabetes Father   ? Hypertension Father   ? Diabetes Paternal Uncle   ? Heart disease Paternal Uncle   ? Alcohol abuse Maternal Grandfather   ? Heart disease Paternal Grandfather   ? ? ?Social History: Reviewed social history from progress note on 12/21/2019. ?Social History  ? ?Socioeconomic History  ? Marital status: Married  ?  Spouse name:  Camillia Herter  ? Number of children: Not on file  ? Years of education: Not on file  ? Highest education level: Not on file  ?Occupational History  ? Occupation: Not employed  ?Tobacco Use  ? Smoking status: Former  ?  Packs/day: 0.00  ?  Types: Cigarettes  ? Smokeless tobacco: Never  ?Vaping Use  ? Vaping Use: Some days  ?Substance and Sexual Activity  ? Alcohol use: Not Currently  ? Drug use: No  ? Sexual activity: Yes  ?  Birth control/protection: I.U.D.  ?  Other Topics Concern  ? Not on file  ?Social History Narrative  ? Lives in [place]; with daughter/room-mate. No smoking; no alcohol. On retirement disability.   ? ?Social Determinants of Health  ? ?Financial Resource Strain: Not on file  ?Food Insecurity: Not on file  ?Transportation Needs: Not on file  ?Physical Activity: Not on file  ?Stress: Not on file  ?Social Connections: Not on file  ? ? ?Allergies: No Known Allergies ? ?Metabolic Disorder Labs: ?No results found for: HGBA1C,  MPG ?No results found for: PROLACTIN ?Lab Results  ?Component Value Date  ? CHOL 204 (H) 02/12/2017  ? TRIG 327 (H) 11/13/2019  ? HDL 53 02/12/2017  ? CHOLHDL 3.8 02/12/2017  ? LDLCALC 138 (H) 02/12/2017  ? ?Lab Results  ?Component Value Date  ? TSH 3.040 02/12/2017  ? ? ?Therapeutic Level Labs: ?No results found for: LITHIUM ?No results found for: VALPROATE ?No components found for:  CBMZ ? ?Current Medications: ?Current Outpatient Medications  ?Medication Sig Dispense Refill  ? acetaminophen (TYLENOL) 500 MG tablet Take 500 mg by mouth every 6 (six) hours as needed.    ? cyanocobalamin 1000 MCG tablet Take 1 tablet by mouth daily.    ? doxylamine, Sleep, (UNISOM) 25 MG tablet Take 0.5 tablets (12.5 mg total) by mouth every 6 (six) hours as needed. 30 tablet 0  ? DULoxetine (CYMBALTA) 30 MG capsule Take 1 capsule (30 mg total) by mouth daily. 90 capsule 0  ? mesalamine (APRISO) 0.375 g 24 hr capsule Take 4 capsules (1.5 g total) by mouth in the morning and at bedtime. 240 capsule 2  ? Prenatal Vit-Fe Fumarate-FA (MULTIVITAMIN-PRENATAL) 27-0.8 MG TABS tablet Take 1 tablet by mouth daily at 12 noon.    ? ?No current facility-administered medications for this visit.  ? ? ? ?Musculoskeletal: ?Strength & Muscle Tone: within normal limits ?Gait & Station: normal ?Patient leans: N/A ? ?Psychiatric Specialty Exam: ?Review of Systems  ?Psychiatric/Behavioral:  Positive for sleep disturbance. The patient is nervous/anxious.   ?All other systems reviewed and are negative.  ?Blood pressure 111/80, pulse (!) 118, temperature 98.3 ?F (36.8 ?C), temperature source Temporal, weight 206 lb (93.4 kg), last menstrual period 03/31/2021, not currently breastfeeding.Body mass index is 39.96 kg/m?.  ?General Appearance: Casual  ?Eye Contact:  Fair  ?Speech:  Clear and Coherent  ?Volume:  Normal  ?Mood:  Anxious coping well  ?Affect:  Congruent  ?Thought Process:  Goal Directed and Descriptions of Associations: Intact  ?Orientation:  Full  (Time, Place, and Person)  ?Thought Content: Logical   ?Suicidal Thoughts:  No  ?Homicidal Thoughts:  No  ?Memory:  Immediate;   Fair ?Recent;   Fair ?Remote;   Fair  ?Judgement:  Fair  ?Insight:  Fair  ?Psychomotor Activity:  Normal  ?Concentration:  Concentration: Fair and Attention Span: Fair  ?Recall:  Fair  ?Fund of Knowledge: Fair  ?Language: Fair  ?Akathisia:  No  ?Handed:  Right  ?AIMS (if indicated): done, 0  ?Assets:  Communication Skills ?Desire for Improvement ?Housing ?Social Support  ?ADL's:  Intact  ?Cognition: WNL  ?Sleep:   restless due to having a toddler  ? ?Screenings: ?GAD-7   ? ?Flowsheet Row Video Visit from 05/08/2021 in [place] Video Visit from 03/27/2021 in [place] Video Visit from 10/23/2020 in [place] Routine Prenatal from 10/18/2019 in [facility]  ?Total GAD-7 Score 4 19 15 15   ? ?  ? ?PHQ2-9   ? ?  [place] Office Visit from 06/24/2021 in [place] Video Visit from 05/08/2021 in [place] Video Visit from 03/27/2021 in [place] Video Visit from 03/06/2021 in [place] from 12/21/2019 in [place]  ?PHQ-2 Total Score 2 1 2 1 2   ?PHQ-9 Total Score 9 5 11 9 15   ? ?  ? ?[facility] ED from 05/04/2021 in [place] Admission (Discharged) from 11/26/2020 in [place]  ?C-SSRS RISK CATEGORY No Risk No Risk  ? ?  ? ? ? ?Assessment and Plan: RAIYAH SPEAKMAN is a 31 year old Caucasian female, married, lives in [place], has a history of MDD, GAD , ulcerative colitis, [redacted] weeks pregnant, was evaluated in office today.  Patient is currently stable.  Plan as noted below. ? ?Plan ?MDD in remission ?Cymbalta 30 mg p.o. daily. ?Patient to work on sleep hygiene for  her sleep problems. ?Patient advised to start psychotherapy sessions-provided resources. ? ?GAD-improving ?Cymbalta 30 mg p.o. daily ?Encouraged to establish care with a therapist to start CBT. ? ?Follow-up in clin

## 2021-06-25 ENCOUNTER — Encounter: Payer: Self-pay | Admitting: Gastroenterology

## 2021-08-05 ENCOUNTER — Telehealth (INDEPENDENT_AMBULATORY_CARE_PROVIDER_SITE_OTHER): Payer: Medicaid Other | Admitting: Psychiatry

## 2021-08-05 ENCOUNTER — Encounter: Payer: Self-pay | Admitting: Psychiatry

## 2021-08-05 DIAGNOSIS — G4700 Insomnia, unspecified: Secondary | ICD-10-CM | POA: Diagnosis not present

## 2021-08-05 DIAGNOSIS — F411 Generalized anxiety disorder: Secondary | ICD-10-CM | POA: Diagnosis not present

## 2021-08-05 DIAGNOSIS — F3342 Major depressive disorder, recurrent, in full remission: Secondary | ICD-10-CM

## 2021-08-05 NOTE — Patient Instructions (Signed)
[email or web address] ? ?[email or web address] ? ?

## 2021-08-05 NOTE — Progress Notes (Signed)
Virtual Visit via Video Note ? ?I connected with Stephanie Ayala on 08/05/21 at 10:30 AM EDT by a video enabled telemedicine application and verified that I am speaking with the correct person using two identifiers. ? ?Location ?Provider Location : [facility] ?Patient Location : Car ? ?Participants: Patient , Mother,Provider ?  ?I discussed the limitations of evaluation and management by telemedicine and the availability of in person appointments. The patient expressed understanding and agreed to proceed. ?  ?I discussed the assessment and treatment plan with the patient. The patient was provided an opportunity to ask questions and all were answered. The patient agreed with the plan and demonstrated an understanding of the instructions. ?  ?The patient was advised to call back or seek an in-person evaluation if the symptoms worsen or if the condition fails to improve as anticipated. ? ? ?[facility] MD OP Progress Note ? ?08/05/2021 3:03 PM ?Stephanie Ayala  ?MRN:  [id number] ? ?Chief Complaint:  ?Chief Complaint  ?Patient presents with  ? Follow-up: 31 year old Caucasian female with history of MDD, GAD, ulcerative colitis with chronic active colitis in cecum as well as in the rectum, currently almost [redacted] weeks pregnant, presented for medication management.  ? ?HPI: Stephanie Ayala is a 31 year old Caucasian female, employed, married, lives in [place], has a history of depression, anxiety, ulcerative colitis with chronic active colitis, almost [redacted] weeks pregnant, presented for medication management by telemedicine today. ? ?Patient reports she is currently overall tolerating her anxiety well.  She continues to be compliant on the Cymbalta.  Denies side effects. ? ?Patient reports sleep as restless since she cannot find a comfortable position to rest at night.  Patient also has not been taking any doxylamine since the past few days.  Patient reports she wanted to know if her nausea is getting better or not and hence stopped taking it.  However  it looks like she still has nausea and hence is planning to go back on the doxylamine at this time. ? ?Patient reports she had her ultrasound today and she was told she is going to have a baby girl.  She is excited about that.  Everything as per her report per ultrasound seems to be okay. ? ?Patient reports good support system from her mother. ? ?Has not been able to schedule an appointment with a therapist yet.  However is motivated to do so. ? ?Denies any suicidality, homicidality or perceptual disturbances. ? ?Denies any other concerns today. ? ?Visit Diagnosis:  ?  ICD-10-CM   ?1. MDD (major depressive disorder), recurrent, in full remission ([place])  F33.42   ?  ?2. GAD (generalized anxiety disorder)  F41.1   ?  ?3. Insomnia, unspecified type  G47.00   ?  ? ? ?Past Psychiatric History: Reviewed past psychiatric history from progress note on 12/21/2019. ? ?Past Medical History:  ?Past Medical History:  ?Diagnosis Date  ? Arthritis   ? Autoimmune disease ([place])   ? Colitis   ? Depression   ? Fibromyalgia   ?  ?Past Surgical History:  ?Procedure Laterality Date  ? COLONOSCOPY WITH PROPOFOL N/A 11/26/2020  ? Procedure: COLONOSCOPY WITH PROPOFOL;  Surgeon: Lin Landsman, MD;  Location: [facility] END[facility]COPY;  Service: Gastroenterology;  Laterality: N/A;  ? OVARY SURGERY Bilateral   ? cyst removal  ? ? ?Family Psychiatric History: Reviewed family psychiatric history from progress note on 12/21/2019. ? ?Family History:  ?Family History  ?Problem Relation Age of Onset  ? Anxiety disorder Mother   ?  Diabetes Father   ? Hypertension Father   ? Diabetes Paternal Uncle   ? Heart disease Paternal Uncle   ? Alcohol abuse Maternal Grandfather   ? Heart disease Paternal Grandfather   ? ? ?Social History: Reviewed social history from progress note on 12/21/2019. ?Social History  ? ?Socioeconomic History  ? Marital status: Married  ?  Spouse name:  Camillia Herter  ? Number of children: Not on file  ? Years of education: Not on file  ? Highest  education level: Not on file  ?Occupational History  ? Occupation: Not employed  ?Tobacco Use  ? Smoking status: Former  ?  Packs/day: 0.00  ?  Types: Cigarettes  ? Smokeless tobacco: Never  ?Vaping Use  ? Vaping Use: Some days  ?Substance and Sexual Activity  ? Alcohol use: Not Currently  ? Drug use: No  ? Sexual activity: Yes  ?  Birth control/protection: I.U.D.  ?Other Topics Concern  ? Not on file  ?Social History Narrative  ? Lives in [place]; with daughter/room-mate. No smoking; no alcohol. On retirement disability.   ? ?Social Determinants of Health  ? ?Financial Resource Strain: Not on file  ?Food Insecurity: Not on file  ?Transportation Needs: Not on file  ?Physical Activity: Not on file  ?Stress: Not on file  ?Social Connections: Not on file  ? ? ?Allergies: No Known Allergies ? ?Metabolic Disorder Labs: ?No results found for: HGBA1C, MPG ?No results found for: PROLACTIN ?Lab Results  ?Component Value Date  ? CHOL 204 (H) 02/12/2017  ? TRIG 327 (H) 11/13/2019  ? HDL 53 02/12/2017  ? CHOLHDL 3.8 02/12/2017  ? LDLCALC 138 (H) 02/12/2017  ? ?Lab Results  ?Component Value Date  ? TSH 3.040 02/12/2017  ? ? ?Therapeutic Level Labs: ?No results found for: LITHIUM ?No results found for: VALPROATE ?No components found for:  CBMZ ? ?Current Medications: ?Current Outpatient Medications  ?Medication Sig Dispense Refill  ? acetaminophen (TYLENOL) 500 MG tablet Take 500 mg by mouth every 6 (six) hours as needed.    ? cyanocobalamin 1000 MCG tablet Take 1 tablet by mouth daily.    ? doxylamine, Sleep, (UNISOM) 25 MG tablet Take 0.5 tablets (12.5 mg total) by mouth every 6 (six) hours as needed. 30 tablet 0  ? DULoxetine (CYMBALTA) 30 MG capsule Take 1 capsule (30 mg total) by mouth daily. 90 capsule 0  ? mesalamine (APRISO) 0.375 g 24 hr capsule Take 4 capsules (1.5 g total) by mouth in the morning and at bedtime. 240 capsule 2  ? Prenatal Vit-Fe Fumarate-FA (MULTIVITAMIN-PRENATAL) 27-0.8 MG TABS tablet Take 1 tablet by  mouth daily at 12 noon.    ? ?No current facility-administered medications for this visit.  ? ? ? ?Musculoskeletal: ?Strength & Muscle Tone:  UTA ?Gait & Station:  Seated ?Patient leans: N/A ? ?Psychiatric Specialty Exam: ?Review of Systems  ?Gastrointestinal:  Positive for abdominal distention.  ?     Currently pregnant  ?Psychiatric/Behavioral:  The patient is nervous/anxious.   ?All other systems reviewed and are negative.  ?Last menstrual period 03/31/2021, not currently breastfeeding.There is no height or weight on file to calculate BMI.  ?General Appearance: Fairly Groomed  ?Eye Contact:  Fair  ?Speech:  Normal Rate  ?Volume:  Normal  ?Mood:  Anxious  ?Affect:  Congruent  ?Thought Process:  Goal Directed and Descriptions of Associations: Intact  ?Orientation:  Full (Time, Place, and Person)  ?Thought Content: Logical   ?Suicidal Thoughts:  No  ?Homicidal Thoughts:  No  ?Memory:  Immediate;   Fair ?Recent;   Fair ?Remote;   Fair  ?Judgement:  Fair  ?Insight:  Fair  ?Psychomotor Activity:  Normal  ?Concentration:  Concentration: Fair and Attention Span: Fair  ?Recall:  Fair  ?Fund of Knowledge: Fair  ?Language: Fair  ?Akathisia:  No  ?Handed:  Right  ?AIMS (if indicated): done  ?Assets:  Communication Skills ?Desire for Improvement ?Housing ?Social Support  ?ADL's:  Intact  ?Cognition: WNL  ?Sleep:   restless  ? ?Screenings: ?AIMS   ? ?Flowsheet Row Video Visit from 08/05/2021 in [place]  ?AIMS Total Score 0  ? ?  ? ?GAD-7   ? ?Flowsheet Row Video Visit from 08/05/2021 in [place] Video Visit from 05/08/2021 in [place] Video Visit from 03/27/2021 in [place] Video Visit from 10/23/2020 in [place] Routine Prenatal from 10/18/2019 in [facility]  ?Total GAD-7 Score 4 4 19 15 15   ? ?  ? ?PHQ2-9   ? ?Flowsheet Row Video Visit from 08/05/2021 in [place] Office Visit from 06/24/2021 in [place] Video Visit from 05/08/2021 in [place] Video Visit from 03/27/2021 in

## 2021-08-06 DIAGNOSIS — O24414 Gestational diabetes mellitus in pregnancy, insulin controlled: Secondary | ICD-10-CM | POA: Insufficient documentation

## 2021-08-19 ENCOUNTER — Telehealth: Payer: Self-pay | Admitting: Gastroenterology

## 2021-08-19 NOTE — Telephone Encounter (Signed)
Medical records were sent to disability determination services 08/19/2021

## 2021-09-12 IMAGING — CR DG CHEST 2V
2 series · 2 of 2 positions shown · non-contrast
Comparison: None.

CLINICAL DATA: Pt states that she developed shortness of breat this
am. Patient states that she was diagnosed with covid on [REDACTED].
Former smoker. Pt was shielded.shob

EXAM:
CHEST - 2 VIEW

[chest pa]
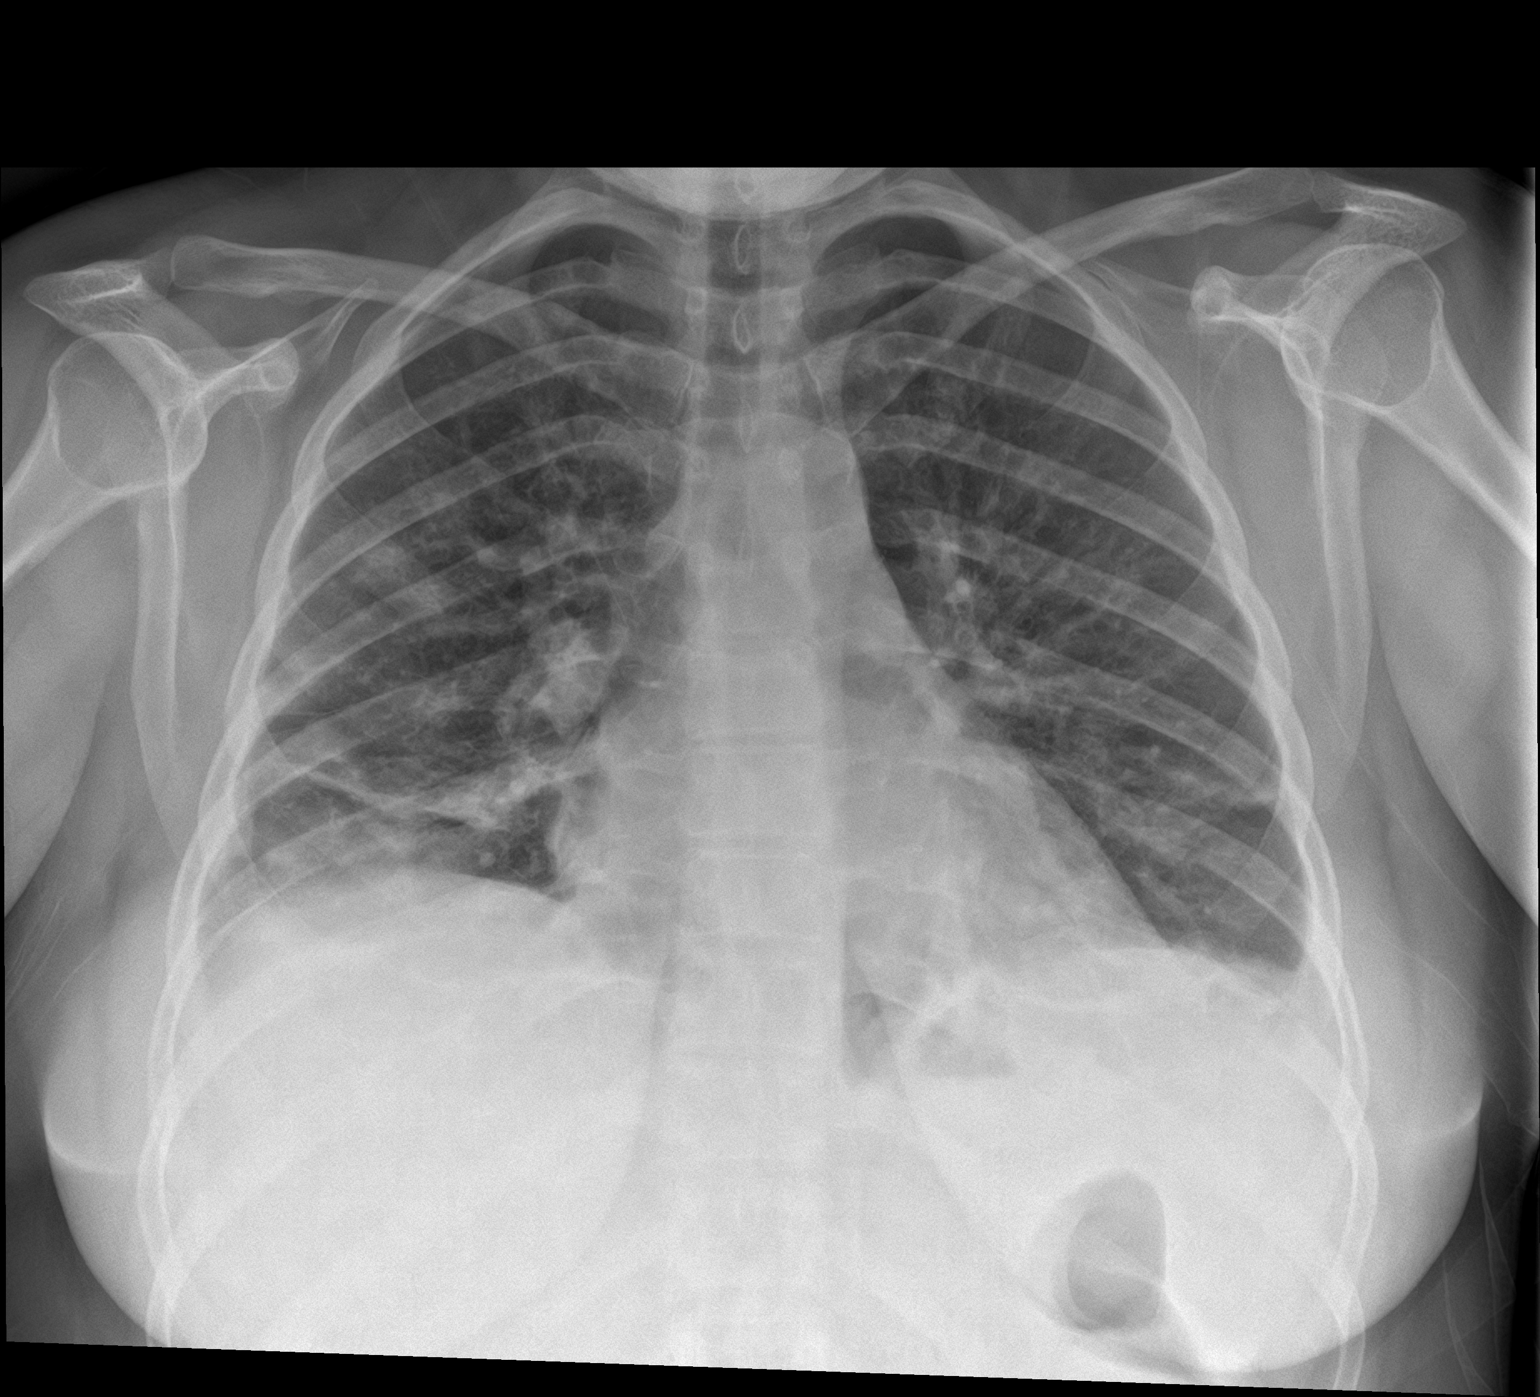

[chest lat]
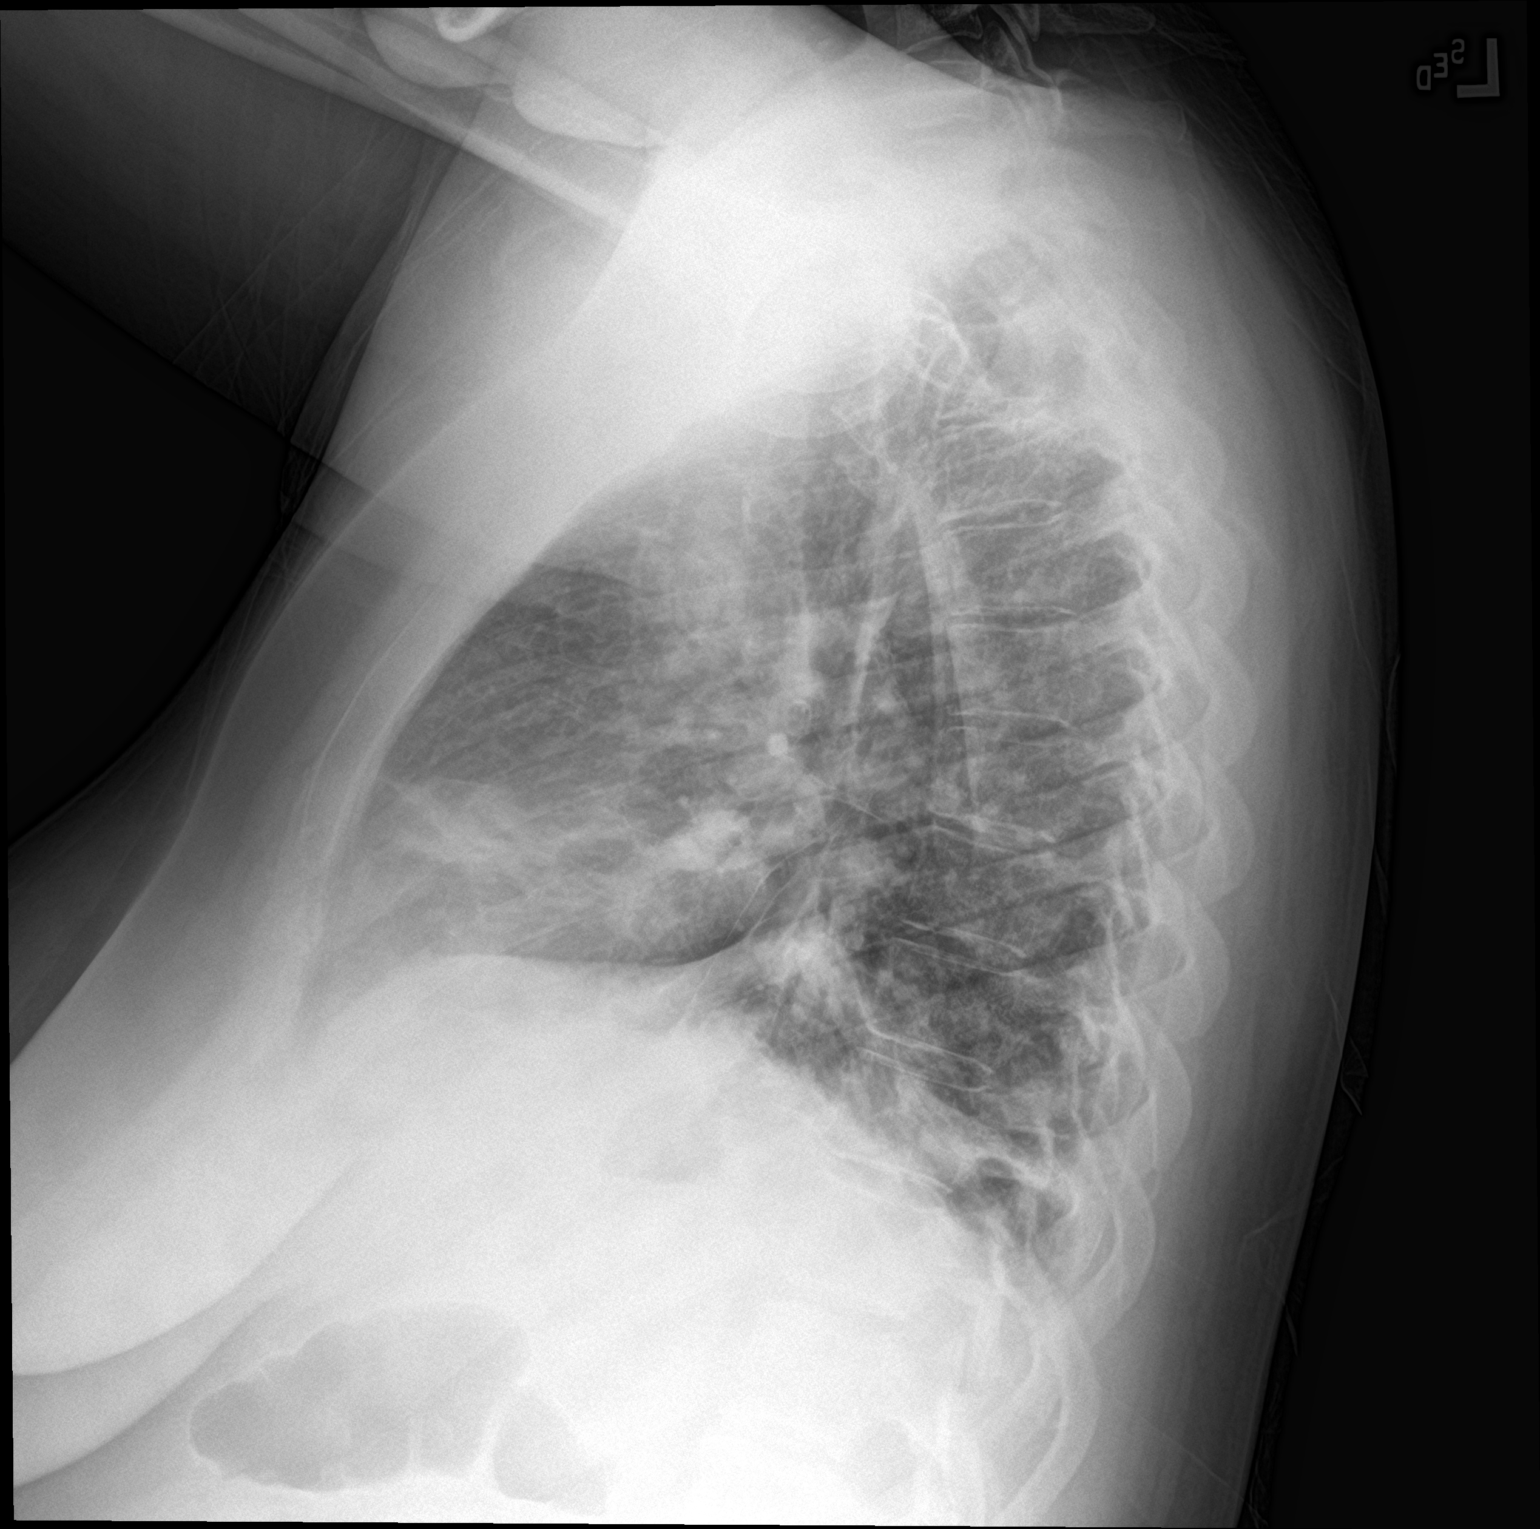

[2 of 2 positions shown; findings below may reference images not displayed]

FINDINGS: Normal cardiac silhouette. There is bilateral streaky airspace
disease with a lower lobe predominance. No effusion. No
pneumothorax. No acute osseous abnormality.
IMPRESSION: Findings consistent with COVID viral pneumonia.

## 2021-09-15 ENCOUNTER — Encounter: Payer: Self-pay | Admitting: Psychiatry

## 2021-09-15 ENCOUNTER — Telehealth (INDEPENDENT_AMBULATORY_CARE_PROVIDER_SITE_OTHER): Payer: Medicaid Other | Admitting: Psychiatry

## 2021-09-15 DIAGNOSIS — F3342 Major depressive disorder, recurrent, in full remission: Secondary | ICD-10-CM | POA: Diagnosis not present

## 2021-09-15 DIAGNOSIS — F411 Generalized anxiety disorder: Secondary | ICD-10-CM | POA: Diagnosis not present

## 2021-09-15 DIAGNOSIS — F4320 Adjustment disorder, unspecified: Secondary | ICD-10-CM | POA: Diagnosis not present

## 2021-09-15 MED ORDER — DULOXETINE HCL 30 MG PO CPEP: 30.0000 mg | ORAL_CAPSULE | Freq: Every day | ORAL | 0 refills | Status: DC

## 2021-09-15 NOTE — Progress Notes (Unsigned)
Virtual Visit via Video Note  I connected with Stephanie Ayala on 09/15/21 at 10:00 AM EDT by a video enabled telemedicine application and verified that I am speaking with the correct person using two identifiers.  Location Provider Location : [facility] Patient Location : Home  Participants: Patient , Student, Zoey ( baby ) ,Provider    I discussed the limitations of evaluation and management by telemedicine and the availability of in person appointments. The patient expressed understanding and agreed to proceed.    I discussed the assessment and treatment plan with the patient. The patient was provided an opportunity to ask questions and all were answered. The patient agreed with the plan and demonstrated an understanding of the instructions.   The patient was advised to call back or seek an in-person evaluation if the symptoms worsen or if the condition fails to improve as anticipated.   [place] MD OP Progress Note  09/15/2021 10:29 AM Stephanie Ayala  MRN:  [id number]  Chief Complaint:  Chief Complaint  Patient presents with   Follow-up: 31 year old Caucasian female with history of MDD, GAD, ulcerative colitis, currently in her second trimester of pregnancy, was evaluated by telemedicine today.   HPI: Stephanie Ayala is a 31 year old Caucasian female,  married, lives in [place], has a history of MDD, GAD, insomnia, ulcerative colitis , almost [redacted] weeks pregnant, was evaluated by telemedicine today.  Patient reports she was recently diagnosed with gestational diabetes.  She started taking insulin at bedtime since the past 1 week.  She reports she also spoke to a dietitian and is currently trying to work on her diet.  Patient reports this is a big adjustment for her and that does make her sad at times.  However denies any significant depressive symptoms.  Reports she is aware that this is a big adjustment and she has to get used to it.  So far taking the Cymbalta on a regular basis.  Denies side  effects.  She has reached out to a psychotherapist, waiting for an appointment.  Agreeable to start CBT.  Patient does struggle with sleep since she has to take care of her 71-1/2-year-old baby who needs her help at night.  Her husband has been supportive.  She does have doxylamine available which she uses as needed for sleep and that helps.  She does feel tired due to her pregnancy.  Patient denies any suicidality, homicidality or perceptual disturbances.  Patient denies any other concerns today.  Visit Diagnosis:    ICD-10-CM   1. MDD (major depressive disorder), recurrent, in full remission ([place])  F33.42 DULoxetine (CYMBALTA) 30 MG capsule    2. GAD (generalized anxiety disorder)  F41.1 DULoxetine (CYMBALTA) 30 MG capsule    3. Adjustment disorder, unspecified type  F43.20       Past Psychiatric History: Reviewed past psychiatric history from progress note on 12/21/2019.  Past Medical History:  Past Medical History:  Diagnosis Date   Arthritis    Autoimmune disease ([place])    Colitis    Depression    Fibromyalgia     Past Surgical History:  Procedure Laterality Date   COLONOSCOPY WITH PROPOFOL N/A 11/26/2020   Procedure: COLONOSCOPY WITH PROPOFOL;  Surgeon: Lin Landsman, MD;  Location: [facility] ENDOSCOPY;  Service: Gastroenterology;  Laterality: N/A;   OVARY SURGERY Bilateral    cyst removal    Family Psychiatric History: Reviewed family psychiatric history from progress note from 12/21/2019.  Family History:  Family History  Problem Relation Age  of Onset   Anxiety disorder Mother    Diabetes Father    Hypertension Father    Diabetes Paternal Uncle    Heart disease Paternal Uncle    Alcohol abuse Maternal Grandfather    Heart disease Paternal Grandfather     Social History: Reviewed social history from progress note on 12/21/2019. Social History   Socioeconomic History   Marital status: Married    Spouse name:  Dylan   Number of children: Not on file   Years  of education: Not on file   Highest education level: Not on file  Occupational History   Occupation: Not employed  Tobacco Use   Smoking status: Former    Packs/day: 0.00    Types: Cigarettes   Smokeless tobacco: Never  Vaping Use   Vaping Use: Some days  Substance and Sexual Activity   Alcohol use: Not Currently   Drug use: No   Sexual activity: Yes    Birth control/protection: I.U.D.  Other Topics Concern   Not on file  Social History Narrative   Lives in [place]; with daughter/room-mate. No smoking; no alcohol. On retirement disability.    Social Determinants of Health   Financial Resource Strain: Not on file  Food Insecurity: Not on file  Transportation Needs: Not on file  Physical Activity: Not on file  Stress: Not on file  Social Connections: Not on file    Allergies: No Known Allergies  Metabolic Disorder Labs: No results found for: "HGBA1C", "MPG" No results found for: "PROLACTIN" Lab Results  Component Value Date   CHOL 204 (H) 02/12/2017   TRIG 327 (H) 11/13/2019   HDL 53 02/12/2017   CHOLHDL 3.8 02/12/2017   LDLCALC 138 (H) 02/12/2017   Lab Results  Component Value Date   TSH 3.040 02/12/2017    Therapeutic Level Labs: No results found for: "LITHIUM" No results found for: "VALPROATE" No results found for: "CBMZ"  Current Medications: Current Outpatient Medications  Medication Sig Dispense Refill   ACCU-CHEK GUIDE test strip USE FOUR (4) TIMES A DAY.     Accu-Chek Softclix Lancets lancets SMARTSIG:1 Each Topical 4 Times Daily     b complex vitamins capsule Take by mouth.     BD INSULIN SYRINGE U/F 31G X 5/16" 1 ML MISC SMARTSIG:1 Each SUB-Q Every Night     Blood Glucose Monitoring Suppl (ACCU-CHEK GUIDE) w/Device KIT USE AS INSTRUCTED PT TO CHECK HER BLOOD SUGAR 4 TIMES A DAY     Blood Glucose Monitoring Suppl (FIFTY50 GLUCOSE METER 2.0) w/Device KIT Use as instructed pt to check her blood sugar 4 times a day     cyanocobalamin 1000 MCG tablet  Take 1 tablet by mouth daily.     doxylamine, Sleep, (UNISOM) 25 MG tablet Take 0.5 tablets (12.5 mg total) by mouth every 6 (six) hours as needed. (Patient taking differently: Take 12.5-25 mg by mouth at bedtime as needed for sleep.) 30 tablet 0   Glucagon, rDNA, (GLUCAGON EMERGENCY) 1 MG KIT Inject into the muscle.     glucose blood (PRECISION QID TEST) test strip by Other route four (4) times a day.     HUMULIN N 100 UNIT/ML injection SMARTSIG:8 Unit(s) SUB-Q Every Night     insulin NPH Human (NOVOLIN N) 100 UNIT/ML injection Inject 8 units at bedtime.     Insulin Syringe-Needle U-100 (INSULIN SYRINGE 1CC/31GX5/16") 31G X 5/16" 1 ML MISC Inject into the skin.     mesalamine (APRISO) 0.375 g 24 hr capsule Take 4  capsules (1.5 g total) by mouth in the morning and at bedtime. 240 capsule 2   Prenatal Vit-Fe Fumarate-FA (MULTIVITAMIN-PRENATAL) 27-0.8 MG TABS tablet Take 1 tablet by mouth daily at 12 noon.     acetaminophen (TYLENOL) 500 MG tablet Take 500 mg by mouth every 6 (six) hours as needed. (Patient not taking: Reported on 09/15/2021)     DULoxetine (CYMBALTA) 30 MG capsule Take 1 capsule (30 mg total) by mouth daily. 90 capsule 0   No current facility-administered medications for this visit.     Musculoskeletal: Strength & Muscle Tone: within normal limits Gait & Station: normal Patient leans: N/A  Psychiatric Specialty Exam: Review of Systems  Gastrointestinal:  Positive for abdominal distention (currently pregnant).  Psychiatric/Behavioral:  Positive for sleep disturbance. The patient is nervous/anxious.   All other systems reviewed and are negative.   Last menstrual period 03/31/2021, not currently breastfeeding.There is no height or weight on file to calculate BMI.  General Appearance: Casual  Eye Contact:  Good  Speech:  Clear and Coherent  Volume:  Normal  Mood:  Anxious  Affect:  Congruent  Thought Process:  Goal Directed and Descriptions of Associations: Intact   Orientation:  Full (Time, Place, and Person)  Thought Content: Logical   Suicidal Thoughts:  No  Homicidal Thoughts:  No  Memory:  Immediate;   Fair Recent;   Fair Remote;   Fair  Judgement:  Fair  Insight:  Fair  Psychomotor Activity:  Normal  Concentration:  Concentration: Fair and Attention Span: Fair  Recall:  [employer] of Knowledge: Fair  Language: Fair  Akathisia:  No  Handed:  Right  AIMS (if indicated): not done  Assets:  Communication Skills Desire for Improvement Housing Social Support  ADL's:  Intact  Cognition: WNL  Sleep:   restless   Screenings: AIMS    Flowsheet Row Video Visit from 08/05/2021 in [place] Total Score 0      GAD-7    Flowsheet Row Video Visit from 08/05/2021 in [place] Video Visit from 05/08/2021 in [place] Video Visit from 03/27/2021 in [place] Video Visit from 10/23/2020 in [place] Routine Prenatal from 10/18/2019 in [facility]  Total GAD-7 Score 4 4 19 15 15       PHQ2-9    Flowsheet Row Video Visit from 09/15/2021 in [place] Video Visit from 08/05/2021 in [place] Office Visit from 06/24/2021 in [place] Video Visit from 05/08/2021 in [place] Video Visit from 03/27/2021 in [place]  PHQ-2 Total Score 1 0 2 1 2   PHQ-9 Total Score 8 5 9 5 11       Flowsheet Row Video Visit from 09/15/2021 in [place] Video Visit from 08/05/2021 in [facility] ED from 05/04/2021 in [place] No Risk No Risk No Risk        Assessment and Plan: Stephanie Ayala is a 58 year old Caucasian female, married,  lives in [place], has a history of MDD, GAD, ulcerative colitis, almost [redacted] weeks pregnant was evaluated by telemedicine today.  Patient with recent diagnosis of gestational diabetes, currently on insulin, does have adjustment problems to this change, will benefit from psychotherapy sessions and continued medication management.  Plan MDD in remission Cymbalta 30 mg p.o. daily Continue doxylamine 25 mg at  bedtime for sleep  GAD-improving Cymbalta as prescribed Referred for CBT.  Adjustment disorder unspecified-unstable Report for CBT Discussed readjusting the dosage of Cymbalta, patient is not interested at this time. Provided brief supportive psychotherapy.  Follow-up in clinic in 1 month or sooner if needed.    Collaboration of Care: Collaboration of Care: Referral or follow-up with counselor/therapist AEB encouraged to establish care with therapist.  Patient/Guardian was advised Release of Information must be obtained prior to any record release in order to collaborate their care with an outside provider. Patient/Guardian was advised if they have not already done so to contact the registration department to sign all necessary forms in order for [place] to release information regarding their care.   Consent: Patient/Guardian gives verbal consent for treatment and assignment of benefits for services provided during this visit. Patient/Guardian expressed understanding and agreed to proceed.   This note was generated in part or whole with voice recognition software. Voice recognition is usually quite accurate but there are transcription errors that can and very often do occur. I apologize for any typographical errors that were not detected and corrected.      Ursula Alert, MD 09/16/2021, 8:28 AM

## 2021-10-01 DIAGNOSIS — E038 Other specified hypothyroidism: Secondary | ICD-10-CM | POA: Insufficient documentation

## 2021-10-12 ENCOUNTER — Other Ambulatory Visit: Payer: Self-pay | Admitting: Gastroenterology

## 2021-10-12 DIAGNOSIS — K51011 Ulcerative (chronic) pancolitis with rectal bleeding: Secondary | ICD-10-CM

## 2021-10-13 MED ORDER — MESALAMINE ER 0.375 G PO CP24: 1500.0000 mg | ORAL_CAPSULE | Freq: Two times a day (BID) | ORAL | 2 refills | Status: DC

## 2021-10-16 ENCOUNTER — Telehealth (INDEPENDENT_AMBULATORY_CARE_PROVIDER_SITE_OTHER): Payer: Medicaid Other | Admitting: Psychiatry

## 2021-10-16 ENCOUNTER — Encounter: Payer: Self-pay | Admitting: Psychiatry

## 2021-10-16 DIAGNOSIS — F411 Generalized anxiety disorder: Secondary | ICD-10-CM | POA: Diagnosis not present

## 2021-10-16 DIAGNOSIS — F331 Major depressive disorder, recurrent, moderate: Secondary | ICD-10-CM | POA: Diagnosis not present

## 2021-10-16 MED ORDER — DULOXETINE HCL 30 MG PO CPEP: 30.0000 mg | ORAL_CAPSULE | Freq: Two times a day (BID) | ORAL | 1 refills | Status: DC

## 2021-10-16 NOTE — Progress Notes (Signed)
Virtual Visit via Video Note  I connected with Stephanie Ayala on 10/16/21 at  4:40 PM EDT by a video enabled telemedicine application and verified that I am speaking with the correct person using two identifiers.  Location Provider Location : [facility] Patient Location : Home  Participants: Patient ,Provider   I discussed the limitations of evaluation and management by telemedicine and the availability of in person appointments. The patient expressed understanding and agreed to proceed.   I discussed the assessment and treatment plan with the patient. The patient was provided an opportunity to ask questions and all were answered. The patient agreed with the plan and demonstrated an understanding of the instructions.   The patient was advised to call back or seek an in-person evaluation if the symptoms worsen or if the condition fails to improve as anticipated.    [place] MD OP Progress Note  10/16/2021 5:28 PM PAYTIN RAMAKRISHNAN  MRN:  [id number]  Chief Complaint:  Chief Complaint  Patient presents with   Follow-up: 31 year old Caucasian female with history of MDD, GAD, ulcerative colitis currently [redacted] weeks pregnant was evaluated by telemedicine today.   HPI: Stephanie Ayala is a 31 year old Caucasian female, married, lives in [place], has a history of MDD, GAD, insomnia, ulcerative colitis, [redacted] weeks pregnant was evaluated by telemedicine today.  Patient today reports she is currently struggling with low mood, low energy, decreased appetite, sleep problems as well as anxiety symptoms.  Patient reports that she was diagnosed with gestational diabetes and is currently on insulin.  She reports her blood sugar continues to be uncontrolled which does have an impact on her energy level as well as she feels dizzy often.  She also reports she was diagnosed with hypothyroidism, was started on levothyroxine.  That likely also contributing to physical symptoms of low energy.  Patient reports she does not have a  good sleep hygiene.  She does take the doxylamine some nights.  When she takes it she is able to sleep okay.  Some nights she does not sleep until later and does not like to take her sleep medication too late since it makes her sluggish the next day.  Patient reports she was able to establish care with a therapist-does not remember the name, manage Center on a weekly basis.  That has been beneficial.  Currently denies any suicidality, homicidality or perceptual disturbances.  Patient denies any other concerns today.  Visit Diagnosis:    ICD-10-CM   1. MDD (major depressive disorder), recurrent episode, moderate (HCC)  F33.1 DULoxetine (CYMBALTA) 30 MG capsule    2. GAD (generalized anxiety disorder)  F41.1 DULoxetine (CYMBALTA) 30 MG capsule      Past Psychiatric History: Reviewed past psychiatric history from progress note on 12/21/2019.  Past Medical History:  Past Medical History:  Diagnosis Date   Arthritis    Autoimmune disease ([place])    Colitis    Depression    Fibromyalgia     Past Surgical History:  Procedure Laterality Date   COLONOSCOPY WITH PROPOFOL N/A 11/26/2020   Procedure: COLONOSCOPY WITH PROPOFOL;  Surgeon: Lin Landsman, MD;  Location: [facility] ENDOSCOPY;  Service: Gastroenterology;  Laterality: N/A;   OVARY SURGERY Bilateral    cyst removal    Family Psychiatric History: Reviewed family psychiatric history from progress note on 12/21/2019.  Family History:  Family History  Problem Relation Age of Onset   Anxiety disorder Mother    Diabetes Father    Hypertension Father  Diabetes Paternal Uncle    Heart disease Paternal Uncle    Alcohol abuse Maternal Grandfather    Heart disease Paternal Grandfather     Social History: Reviewed social history from progress note on 12/21/2019. Social History   Socioeconomic History   Marital status: Married    Spouse name:  Dylan   Number of children: Not on file   Years of education: Not on file   Highest  education level: Not on file  Occupational History   Occupation: Not employed  Tobacco Use   Smoking status: Former    Packs/day: 0.00    Types: Cigarettes   Smokeless tobacco: Never  Vaping Use   Vaping Use: Some days  Substance and Sexual Activity   Alcohol use: Not Currently   Drug use: No   Sexual activity: Yes    Birth control/protection: I.U.D.  Other Topics Concern   Not on file  Social History Narrative   Lives in [place]; with daughter/room-mate. No smoking; no alcohol. On retirement disability.    Social Determinants of Health   Financial Resource Strain: Not on file  Food Insecurity: Not on file  Transportation Needs: Not on file  Physical Activity: Not on file  Stress: Not on file  Social Connections: Not on file    Allergies: No Known Allergies  Metabolic Disorder Labs: No results found for: "HGBA1C", "MPG" No results found for: "PROLACTIN" Lab Results  Component Value Date   CHOL 204 (H) 02/12/2017   TRIG 327 (H) 11/13/2019   HDL 53 02/12/2017   CHOLHDL 3.8 02/12/2017   LDLCALC 138 (H) 02/12/2017   Lab Results  Component Value Date   TSH 3.040 02/12/2017    Therapeutic Level Labs: No results found for: "LITHIUM" No results found for: "VALPROATE" No results found for: "CBMZ"  Current Medications: Current Outpatient Medications  Medication Sig Dispense Refill   ACCU-CHEK GUIDE test strip USE FOUR (4) TIMES A DAY.     Accu-Chek Softclix Lancets lancets SMARTSIG:1 Each Topical 4 Times Daily     acetaminophen (TYLENOL) 500 MG tablet Take 500 mg by mouth every 6 (six) hours as needed.     b complex vitamins capsule Take by mouth.     BD INSULIN SYRINGE U/F 31G X 5/16" 1 ML MISC SMARTSIG:1 Each SUB-Q Every Night     Blood Glucose Monitoring Suppl (ACCU-CHEK GUIDE) w/Device KIT USE AS INSTRUCTED PT TO CHECK HER BLOOD SUGAR 4 TIMES A DAY     Blood Glucose Monitoring Suppl (FIFTY50 GLUCOSE METER 2.0) w/Device KIT Use as instructed pt to check her blood  sugar 4 times a day     cyanocobalamin 1000 MCG tablet Take 1 tablet by mouth daily.     doxylamine, Sleep, (UNISOM) 25 MG tablet Take 0.5 tablets (12.5 mg total) by mouth every 6 (six) hours as needed. (Patient taking differently: Take 12.5-25 mg by mouth at bedtime as needed for sleep.) 30 tablet 0   Glucagon, rDNA, (GLUCAGON EMERGENCY) 1 MG KIT Inject into the muscle.     glucose blood (PRECISION QID TEST) test strip by Other route four (4) times a day.     HUMULIN N 100 UNIT/ML injection SMARTSIG:8 Unit(s) SUB-Q Every Night     insulin NPH Human (NOVOLIN N) 100 UNIT/ML injection Inject 8 units at bedtime.     Insulin Syringe-Needle U-100 (INSULIN SYRINGE 1CC/31GX5/16") 31G X 5/16" 1 ML MISC Inject into the skin.     levothyroxine (SYNTHROID) 50 MCG tablet Take 1 tablet by  mouth daily.     mesalamine (APRISO) 0.375 g 24 hr capsule Take 4 capsules (1.5 g total) by mouth in the morning and at bedtime. 240 capsule 2   Prenatal Vit-Fe Fumarate-FA (MULTIVITAMIN-PRENATAL) 27-0.8 MG TABS tablet Take 1 tablet by mouth daily at 12 noon.     DULoxetine (CYMBALTA) 30 MG capsule Take 1 capsule (30 mg total) by mouth 2 (two) times daily. 60 capsule 1   No current facility-administered medications for this visit.     Musculoskeletal: Strength & Muscle Tone:  UTA Gait & Station:  Seated Patient leans: N/A  Psychiatric Specialty Exam: Review of Systems  Constitutional:  Positive for fatigue.  Gastrointestinal:        Pregnant  Psychiatric/Behavioral:  Positive for decreased concentration, dysphoric mood and sleep disturbance. The patient is nervous/anxious.   All other systems reviewed and are negative.   Last menstrual period 03/31/2021, not currently breastfeeding.There is no height or weight on file to calculate BMI.  General Appearance: Casual  Eye Contact:  Fair  Speech:  Clear and Coherent  Volume:  Normal  Mood:  Anxious and Dysphoric  Affect:  Congruent  Thought Process:  Goal  Directed and Descriptions of Associations: Intact  Orientation:  Full (Time, Place, and Person)  Thought Content: Logical   Suicidal Thoughts:  No  Homicidal Thoughts:  No  Memory:  Immediate;   Fair Recent;   Fair Remote;   Fair  Judgement:  Fair  Insight:  Fair  Psychomotor Activity:  Normal  Concentration:  Concentration: Fair and Attention Span: Fair  Recall:  [employer] of Knowledge: Fair  Language: Fair  Akathisia:  No  Handed:  Right  AIMS (if indicated): not done  Assets:  Communication Skills Desire for Improvement Housing Social Support Transportation  ADL's:  Intact  Cognition: WNL  Sleep:  Poor   Screenings: AIMS    Flowsheet Row Video Visit from 08/05/2021 in [place] Total Score 0      GAD-7    Flowsheet Row Video Visit from 10/16/2021 in [place] Video Visit from 08/05/2021 in [place] Video Visit from 05/08/2021 in [place] Video Visit from 03/27/2021 in [place] Video Visit from 10/23/2020 in [place]  Total GAD-7 Score _0 PHQ2-9    Flowsheet Row Video Visit from 10/16/2021 in [place] Video Visit from 09/15/2021 in [place] Video Visit from 08/05/2021 in [place] from 06/24/2021 in [place] Video Visit from 05/08/2021 in [place]  PHQ-2 Total Score 4 1 0 2 1  PHQ-9 Total Score _1 Flowsheet Row Video Visit from 10/16/2021 in [place] Video Visit from 09/15/2021 in [place] Video Visit from 08/05/2021 in [place] No Risk No Risk No Risk        Assessment  and Plan: Stephanie Ayala is a 42 year old Caucasian female, married, lives in [place], has a history of MDD, GAD, ulcerative colitis, [redacted] weeks pregnant was evaluated by telemedicine today.  Patient with recent diagnosis of gestational diabetes as well as hypothyroidism currently on insulin as well as levothyroxine, currently struggling with mood, sleep, will benefit from following plan.  Plan MDD-unstable Increase Cymbalta to 30 mg  p.o. twice daily Doxylamine 25 mg at bedtime as needed for sleep. Discussed sleep hygiene techniques.  GAD-unstable Increase Cymbalta to 30 mg p.o. twice daily Patient advised to continue CBT on a weekly basis  Encouraged compliance with insulin as well as levothyroxine-her mood symptoms also likely related to her uncontrolled blood sugar level as well as hypothyroidism.  Follow-up in clinic in 3 to 4 weeks or sooner if needed.   Collaboration of Care: Collaboration of Care: Referral or follow-up with counselor/therapist AEB encouraged to follow up with her therapist. Encouraged to follow up with OB/GYN.  Patient/Guardian was advised Release of Information must be obtained prior to any record release in order to collaborate their care with an outside provider. Patient/Guardian was advised if they have not already done so to contact the registration department to sign all necessary forms in order for [place] to release information regarding their care.   Consent: Patient/Guardian gives verbal consent for treatment and assignment of benefits for services provided during this visit. Patient/Guardian expressed understanding and agreed to proceed.  This note was generated in part or whole with voice recognition software. Voice recognition is usually quite accurate but there are transcription errors that can and very often do occur. I apologize for any typographical errors that were not detected and corrected.       Ursula Alert, MD 10/17/2021, 8:22 AM

## 2021-11-08 ENCOUNTER — Other Ambulatory Visit: Payer: Self-pay | Admitting: Psychiatry

## 2021-11-08 DIAGNOSIS — F411 Generalized anxiety disorder: Secondary | ICD-10-CM

## 2021-11-08 DIAGNOSIS — F331 Major depressive disorder, recurrent, moderate: Secondary | ICD-10-CM

## 2021-11-24 ENCOUNTER — Telehealth (INDEPENDENT_AMBULATORY_CARE_PROVIDER_SITE_OTHER): Payer: Medicaid Other | Admitting: Psychiatry

## 2021-11-24 ENCOUNTER — Encounter: Payer: Self-pay | Admitting: Psychiatry

## 2021-11-24 DIAGNOSIS — F411 Generalized anxiety disorder: Secondary | ICD-10-CM

## 2021-11-24 DIAGNOSIS — Z634 Disappearance and death of family member: Secondary | ICD-10-CM

## 2021-11-24 DIAGNOSIS — F3341 Major depressive disorder, recurrent, in partial remission: Secondary | ICD-10-CM | POA: Diagnosis not present

## 2021-11-24 DIAGNOSIS — F331 Major depressive disorder, recurrent, moderate: Secondary | ICD-10-CM

## 2021-11-24 MED ORDER — DULOXETINE HCL 30 MG PO CPEP: 30.0000 mg | ORAL_CAPSULE | Freq: Two times a day (BID) | ORAL | 1 refills | Status: DC

## 2021-11-24 NOTE — Progress Notes (Unsigned)
Virtual Visit via Video Note  I connected with Stephanie Ayala on 11/24/21 at  3:20 PM EDT by a video enabled telemedicine application and verified that I am speaking with the correct person using two identifiers. Location Provider Location : [facility] Patient Location : Home  Participants: Patient , Provider    I discussed the limitations of evaluation and management by telemedicine and the availability of in person appointments. The patient expressed understanding and agreed to proceed.   I discussed the assessment and treatment plan with the patient. The patient was provided an opportunity to ask questions and all were answered. The patient agreed with the plan and demonstrated an understanding of the instructions.   The patient was advised to call back or seek an in-person evaluation if the symptoms worsen or if the condition fails to improve as anticipated.  [facility] MD OP Progress Note  11/25/2021 9:02 AM Stephanie Ayala  MRN:  [id number]  Chief Complaint:  Chief Complaint  Patient presents with   Follow-up: 31 year old Caucasian female with history of anxiety, depression, ulcerative colitis, currently pregnant was evaluated by telemedicine today.   HPI: Stephanie Ayala is a 31 year old Caucasian female, married, lives in [place], has a history of MDD, GAD, insomnia, ulcerative colitis, currently in her third trimester pregnancy was evaluated by telemedicine today.  Patient today reports she is currently in her third trimester and getting bigger and getting more uncomfortable.  Patient reports she struggles with her energy level as she is getting closer to her due date.  Patient reports her due date is on September 3rd week.  She however reports she is going to have her labor induced on September 13.  She looks forward to that.  Patient reports she currently struggles with grief, sadness, due to several deaths in the family.  One of her friends committed suicide.  She reports there were 2 other  deaths in the family.  She however has been coping okay.  Agrees to reach out to her therapist.  Patient reports she is compliant on the duloxetine higher dosage and that has been beneficial.  Denies side effects.  Patient does report sleep problems due to her pregnancy, third trimester.  She however reports she has been trying to get enough rest and her family has been supportive.  Denies any suicidality, homicidality or perceptual disturbances.  Patient denies any other concerns today.  Visit Diagnosis:    ICD-10-CM   1. MDD (major depressive disorder), recurrent, in partial remission (HCC)  F33.41 DULoxetine (CYMBALTA) 30 MG capsule    2. GAD (generalized anxiety disorder)  F41.1 DULoxetine (CYMBALTA) 30 MG capsule    3. Bereavement  Z63.4       Past Psychiatric History: Reviewed past psychiatric history from progress note on 12/21/2019.  Past Medical History:  Past Medical History:  Diagnosis Date   Arthritis    Autoimmune disease ([place])    Colitis    Depression    Fibromyalgia     Past Surgical History:  Procedure Laterality Date   COLONOSCOPY WITH PROPOFOL N/A 11/26/2020   Procedure: COLONOSCOPY WITH PROPOFOL;  Surgeon: Lin Landsman, MD;  Location: [facility] ENDOSCOPY;  Service: Gastroenterology;  Laterality: N/A;   OVARY SURGERY Bilateral    cyst removal    Family Psychiatric History: Reviewed family psychiatric history from progress note on 12/21/2019.  Family History:  Family History  Problem Relation Age of Onset   Anxiety disorder Mother    Diabetes Father    Hypertension Father  Diabetes Paternal Uncle    Heart disease Paternal Uncle    Alcohol abuse Maternal Grandfather    Heart disease Paternal Grandfather     Social History: Reviewed social history from progress note on 12/21/2019. Social History   Socioeconomic History   Marital status: Married    Spouse name:  Dylan   Number of children: Not on file   Years of education: Not on file    Highest education level: Not on file  Occupational History   Occupation: Not employed  Tobacco Use   Smoking status: Former    Packs/day: 0.00    Types: Cigarettes   Smokeless tobacco: Never  Vaping Use   Vaping Use: Some days  Substance and Sexual Activity   Alcohol use: Not Currently   Drug use: No   Sexual activity: Yes    Birth control/protection: I.U.D.  Other Topics Concern   Not on file  Social History Narrative   Lives in [place]; with daughter/room-mate. No smoking; no alcohol. On retirement disability.    Social Determinants of Health   Financial Resource Strain: Not on file  Food Insecurity: Not on file  Transportation Needs: Not on file  Physical Activity: Not on file  Stress: Not on file  Social Connections: Not on file    Allergies: No Known Allergies  Metabolic Disorder Labs: No results found for: "HGBA1C", "MPG" No results found for: "PROLACTIN" Lab Results  Component Value Date   CHOL 204 (H) 02/12/2017   TRIG 327 (H) 11/13/2019   HDL 53 02/12/2017   CHOLHDL 3.8 02/12/2017   LDLCALC 138 (H) 02/12/2017   Lab Results  Component Value Date   TSH 3.040 02/12/2017    Therapeutic Level Labs: No results found for: "LITHIUM" No results found for: "VALPROATE" No results found for: "CBMZ"  Current Medications: Current Outpatient Medications  Medication Sig Dispense Refill   ACCU-CHEK GUIDE test strip USE FOUR (4) TIMES A DAY.     Accu-Chek Softclix Lancets lancets SMARTSIG:1 Each Topical 4 Times Daily     acetaminophen (TYLENOL) 500 MG tablet Take 500 mg by mouth every 6 (six) hours as needed.     b complex vitamins capsule Take by mouth.     BD INSULIN SYRINGE U/F 31G X 5/16" 1 ML MISC SMARTSIG:1 Each SUB-Q Every Night     Blood Glucose Monitoring Suppl (ACCU-CHEK GUIDE) w/Device KIT USE AS INSTRUCTED PT TO CHECK HER BLOOD SUGAR 4 TIMES A DAY     Blood Glucose Monitoring Suppl (FIFTY50 GLUCOSE METER 2.0) w/Device KIT Use as instructed pt to check  her blood sugar 4 times a day     cyanocobalamin 1000 MCG tablet Take 1 tablet by mouth daily.     doxylamine, Sleep, (UNISOM) 25 MG tablet Take 0.5 tablets (12.5 mg total) by mouth every 6 (six) hours as needed. (Patient taking differently: Take 12.5-25 mg by mouth at bedtime as needed for sleep.) 30 tablet 0   Glucagon, rDNA, (GLUCAGON EMERGENCY) 1 MG KIT Inject into the muscle.     glucose blood (PRECISION QID TEST) test strip by Other route four (4) times a day.     HUMULIN N 100 UNIT/ML injection SMARTSIG:8 Unit(s) SUB-Q Every Night     insulin NPH Human (NOVOLIN N) 100 UNIT/ML injection Inject 8 units at bedtime.     Insulin Syringe-Needle U-100 (INSULIN SYRINGE 1CC/31GX5/16") 31G X 5/16" 1 ML MISC Inject into the skin.     levothyroxine (SYNTHROID) 50 MCG tablet Take 1 tablet by  mouth daily.     Magnesium 250 MG TABS Take 500 mg by mouth.     mesalamine (APRISO) 0.375 g 24 hr capsule Take 4 capsules (1.5 g total) by mouth in the morning and at bedtime. 240 capsule 2   Prenatal Vit-Fe Fumarate-FA (MULTIVITAMIN-PRENATAL) 27-0.8 MG TABS tablet Take 1 tablet by mouth daily at 12 noon.     DULoxetine (CYMBALTA) 30 MG capsule Take 1 capsule (30 mg total) by mouth 2 (two) times daily. 60 capsule 1   PAXLOVID, 300/100, 20 x 150 MG & 10 x 100MG TBPK Take by mouth as directed. (Patient not taking: Reported on 11/24/2021)     No current facility-administered medications for this visit.     Musculoskeletal: Strength & Muscle Tone:  UTA Gait & Station:  Seated Patient leans: N/A  Psychiatric Specialty Exam: Review of Systems  Constitutional:  Positive for fatigue.  Gastrointestinal:        Pregnant  Psychiatric/Behavioral:  Positive for dysphoric mood and sleep disturbance.   All other systems reviewed and are negative.   Last menstrual period 03/31/2021, not currently breastfeeding.There is no height or weight on file to calculate BMI.  General Appearance: Casual  Eye Contact:  Fair   Speech:  Clear and Coherent  Volume:  Normal  Mood:  Dysphoric  Affect:  Congruent  Thought Process:  Goal Directed and Descriptions of Associations: Intact  Orientation:  Full (Time, Place, and Person)  Thought Content: Logical   Suicidal Thoughts:  No  Homicidal Thoughts:  No  Memory:  Immediate;   Fair Recent;   Fair Remote;   Fair  Judgement:  Fair  Insight:  Fair  Psychomotor Activity:  Normal  Concentration:  Concentration: Fair and Attention Span: Fair  Recall:  [employer] of Knowledge: Fair  Language: Fair  Akathisia:  No  Handed:  Right  AIMS (if indicated): not done  Assets:  Communication Skills Desire for Improvement Housing Social Support Transportation  ADL's:  Intact  Cognition: WNL  Sleep:   Restless   Screenings: AIMS    Flowsheet Row Video Visit from 08/05/2021 in [place] Total Score 0      GAD-7    Flowsheet Row Video Visit from 10/16/2021 in [place] Video Visit from 08/05/2021 in [place] Video Visit from 05/08/2021 in [place] Video Visit from 03/27/2021 in [place] Video Visit from 10/23/2020 in [place]  Total GAD-7 Score 14 4 4 19 15       PHQ2-9    Flowsheet Row Video Visit from 11/24/2021 in [place] Video Visit from 10/16/2021 in [place] Video Visit from 09/15/2021 in [place] Video Visit from 08/05/2021 in [place] Office Visit from 06/24/2021 in [facility]  PHQ-2 Total Score 3 4 1  0 2  PHQ-9 Total Score 7 19 8 5 9       Flowsheet Row Video Visit from 11/24/2021 in [place] Video Visit from 10/16/2021 in [place] Video Visit from  09/15/2021 in [place] No Risk No Risk No Risk        Assessment and Plan: Stephanie Ayala is a 31 year old Caucasian female, married, lives in [place], has a history of MDD, GAD, ulcerative colitis, third trimester pregnancy was evaluated by telemedicine today.  Patient with the recent  death of her friend and another family member, currently grieving, will benefit from psychotherapy sessions.  Plan as noted below.  Plan MDD-in partial remission Cymbalta 30 mg p.o. twice daily.  Provided education about pregnancy and breast-feeding implications of Cymbalta. Does have doxylamine available however has not been using it. Continue sleep hygiene techniques  GAD-improving Cymbalta 30 mg p.o. twice daily  Bereavement-unstable Referral for CBT, patient agrees to follow up with a therapist.  Follow-up in clinic in 3 to 4 weeks or sooner if needed.    Collaboration of Care: Collaboration of Care: Referral or follow-up with counselor/therapist AEB is to follow-up with therapist.  Patient/Guardian was advised Release of Information must be obtained prior to any record release in order to collaborate their care with an outside provider. Patient/Guardian was advised if they have not already done so to contact the registration department to sign all necessary forms in order for [place] to release information regarding their care.   Consent: Patient/Guardian gives verbal consent for treatment and assignment of benefits for services provided during this visit. Patient/Guardian expressed understanding and agreed to proceed.  This note was generated in part or whole with voice recognition software. Voice recognition is usually quite accurate but there are transcription errors that can and very often do occur. I apologize for any typographical errors that were not detected and corrected.       Ursula Alert, MD 11/25/2021, 9:02 AM

## 2021-12-22 ENCOUNTER — Encounter: Payer: Self-pay | Admitting: Psychiatry

## 2021-12-22 ENCOUNTER — Telehealth (INDEPENDENT_AMBULATORY_CARE_PROVIDER_SITE_OTHER): Payer: Medicaid Other | Admitting: Psychiatry

## 2021-12-22 DIAGNOSIS — F3341 Major depressive disorder, recurrent, in partial remission: Secondary | ICD-10-CM | POA: Diagnosis not present

## 2021-12-22 DIAGNOSIS — F411 Generalized anxiety disorder: Secondary | ICD-10-CM | POA: Diagnosis not present

## 2021-12-22 DIAGNOSIS — Z634 Disappearance and death of family member: Secondary | ICD-10-CM | POA: Diagnosis not present

## 2021-12-22 MED ORDER — DULOXETINE HCL 30 MG PO CPEP: 30.0000 mg | ORAL_CAPSULE | Freq: Two times a day (BID) | ORAL | 0 refills | Status: DC

## 2021-12-22 NOTE — Progress Notes (Unsigned)
Virtual Visit via Video Note  I connected with Stephanie Ayala on 12/22/21 at  4:30 PM EDT by a video enabled telemedicine application and verified that I am speaking with the correct person using two identifiers.  Location Provider Location : [facility] Patient Location : Home  Participants: Patient , Provider    I discussed the limitations of evaluation and management by telemedicine and the availability of in person appointments. The patient expressed understanding and agreed to proceed.    I discussed the assessment and treatment plan with the patient. The patient was provided an opportunity to ask questions and all were answered. The patient agreed with the plan and demonstrated an understanding of the instructions.   The patient was advised to call back or seek an in-person evaluation if the symptoms worsen or if the condition fails to improve as anticipated.   [place] MD OP Progress Note  12/23/2021 8:28 AM Stephanie Ayala  MRN:  Ayala  Chief Complaint:  Chief Complaint  Patient presents with   Follow-up   Depression   Anxiety   HPI: Stephanie Ayala is a 31 year old Caucasian female, married, lives in [place], has a history of MDD, GAD, insomnia, ulcerative colitis, subclinical hypothyroidism, currently postpartum 10 days, was evaluated by telemedicine today.  Patient is currently postpartum-status post spontaneous vaginal delivery 12/12/2021-reports her baby 'girl' is doing well.  Currently breast-feeding.  Sleeps through the night and hence she has been able to get dressed.  She does have good support system from her family.  Today was the first day she was left alone with her 2 daughters since the delivery and she was anxious although it is currently going well.  Patient continues to be compliant on the Cymbalta, denies side effects.  Would like to stay on this medication.  Denies any significant depression.  She does have anxiety, worries about her baby, taking care of her daughter's  and so on however they are manageable.  Agrees to reestablish care with a therapist.  Denies any suicidality, homicidality or perceptual disturbances.  Patient denies any other concerns today.  Visit Diagnosis:    ICD-10-CM   1. MDD (major depressive disorder), recurrent, in partial remission (HCC)  F33.41 DULoxetine (CYMBALTA) 30 MG capsule    2. GAD (generalized anxiety disorder)  F41.1 DULoxetine (CYMBALTA) 30 MG capsule    3. Bereavement  Z63.4       Past Psychiatric History: Reviewed past psychiatric history from progress note on 12/21/2019.  Past Medical History:  Past Medical History:  Diagnosis Date   Arthritis    Autoimmune disease ([place])    Colitis    Depression    Fibromyalgia     Past Surgical History:  Procedure Laterality Date   COLONOSCOPY WITH PROPOFOL N/A 11/26/2020   Procedure: COLONOSCOPY WITH PROPOFOL;  Surgeon: Lin Landsman, MD;  Location: [facility] ENDOSCOPY;  Service: Gastroenterology;  Laterality: N/A;   OVARY SURGERY Bilateral    cyst removal    Family Psychiatric History: Reviewed family psychiatric history from progress note on 12/21/2019.  Family History:  Family History  Problem Relation Age of Onset   Anxiety disorder Mother    Diabetes Father    Hypertension Father    Diabetes Paternal Uncle    Heart disease Paternal Uncle    Alcohol abuse Maternal Grandfather    Heart disease Paternal Grandfather     Social History: Reviewed social history from progress note on 12/21/2019. Social History   Socioeconomic History   Marital status:  Married    Spouse name:  Dylan   Number of children: Not on file   Years of education: Not on file   Highest education level: Not on file  Occupational History   Occupation: Not employed  Tobacco Use   Smoking status: Former    Packs/day: 0.00    Types: Cigarettes   Smokeless tobacco: Never  Vaping Use   Vaping Use: Some days  Substance and Sexual Activity   Alcohol use: Not Currently   Drug  use: No   Sexual activity: Yes    Birth control/protection: I.U.D.  Other Topics Concern   Not on file  Social History Narrative   Lives in [place]; with daughter/room-mate. No smoking; no alcohol. On retirement disability.    Social Determinants of Health   Financial Resource Strain: Not on file  Food Insecurity: Not on file  Transportation Needs: Not on file  Physical Activity: Not on file  Stress: Not on file  Social Connections: Not on file    Allergies: No Known Allergies  Metabolic Disorder Labs: No results found for: "HGBA1C", "MPG" No results found for: "PROLACTIN" Lab Results  Component Value Date   CHOL 204 (H) 02/12/2017   TRIG 327 (H) 11/13/2019   HDL 53 02/12/2017   CHOLHDL 3.8 02/12/2017   LDLCALC 138 (H) 02/12/2017   Lab Results  Component Value Date   TSH 3.040 02/12/2017    Therapeutic Level Labs: No results found for: "LITHIUM" No results found for: "VALPROATE" No results found for: "CBMZ"  Current Medications: Current Outpatient Medications  Medication Sig Dispense Refill   ACCU-CHEK GUIDE test strip USE FOUR (4) TIMES A DAY.     Accu-Chek Softclix Lancets lancets SMARTSIG:1 Each Topical 4 Times Daily     acetaminophen (TYLENOL) 500 MG tablet Take 500 mg by mouth every 6 (six) hours as needed.     b complex vitamins capsule Take by mouth.     BD INSULIN SYRINGE U/F 31G X 5/16" 1 ML MISC SMARTSIG:1 Each SUB-Q Every Night     Blood Glucose Monitoring Suppl (ACCU-CHEK GUIDE) w/Device KIT USE AS INSTRUCTED PT TO CHECK HER BLOOD SUGAR 4 TIMES A DAY     Blood Glucose Monitoring Suppl (FIFTY50 GLUCOSE METER 2.0) w/Device KIT Use as instructed pt to check her blood sugar 4 times a day     Glucagon, rDNA, (GLUCAGON EMERGENCY) 1 MG KIT Inject into the muscle.     glucose blood (PRECISION QID TEST) test strip by Other route four (4) times a day.     HUMULIN N 100 UNIT/ML injection SMARTSIG:8 Unit(s) SUB-Q Every Night     insulin NPH Human (NOVOLIN N) 100  UNIT/ML injection Inject 8 units at bedtime.     Insulin Syringe-Needle U-100 (INSULIN SYRINGE 1CC/31GX5/16") 31G X 5/16" 1 ML MISC Inject into the skin.     levothyroxine (SYNTHROID) 50 MCG tablet Take 1 tablet by mouth daily.     Magnesium 250 MG TABS Take 500 mg by mouth.     mesalamine (APRISO) 0.375 g 24 hr capsule Take 4 capsules (1.5 g total) by mouth in the morning and at bedtime. 240 capsule 2   Prenatal Vit-Fe Fumarate-FA (MULTIVITAMIN-PRENATAL) 27-0.8 MG TABS tablet Take 1 tablet by mouth daily at 12 noon.     doxylamine, Sleep, (UNISOM) 25 MG tablet Take 0.5 tablets (12.5 mg total) by mouth every 6 (six) hours as needed. (Patient not taking: Reported on 12/22/2021) 30 tablet 0   DULoxetine (CYMBALTA) 30 MG  capsule Take 1 capsule (30 mg total) by mouth 2 (two) times daily. 180 capsule 0   PAXLOVID, 300/100, 20 x 150 MG & 10 x 100MG TBPK Take by mouth as directed. (Patient not taking: Reported on 11/24/2021)     No current facility-administered medications for this visit.     Musculoskeletal: Strength & Muscle Tone:  UTA Gait & Station:  Seated Patient leans: N/A  Psychiatric Specialty Exam: Review of Systems  Psychiatric/Behavioral:  The patient is nervous/anxious.   All other systems reviewed and are negative.   Last menstrual period 03/31/2021, currently breastfeeding.There is no height or weight on file to calculate BMI.  General Appearance: Casual  Eye Contact:  Fair  Speech:  Clear and Coherent  Volume:  Normal  Mood:  Anxious coping well  Affect:  Appropriate  Thought Process:  Goal Directed and Descriptions of Associations: Intact  Orientation:  Full (Time, Place, and Person)  Thought Content: Logical   Suicidal Thoughts:  No  Homicidal Thoughts:  No  Memory:  Immediate;   Fair Recent;   Fair Remote;   Fair  Judgement:  Fair  Insight:  Fair  Psychomotor Activity:  Normal  Concentration:  Concentration: Fair and Attention Span: Fair  Recall:  [employer] of  Knowledge: Fair  Language: Fair  Akathisia:  No  Handed:  Right  AIMS (if indicated): not done  Assets:  Communication Skills Desire for [place] Talents/Skills Transportation  ADL's:  Intact  Cognition: WNL  Sleep:  Fair   Screenings: AIMS    Flowsheet Row Video Visit from 08/05/2021 in [facility] Total Score 0      GAD-7    Flowsheet Row Video Visit from 10/16/2021 in [facility] Video Visit from 08/05/2021 in [place] Video Visit from 05/08/2021 in [facility] Video Visit from 03/27/2021 in [place] Video Visit from 10/23/2020 in [place]  Total GAD-7 Score _0 PHQ2-9    Flowsheet Row Video Visit from 11/24/2021 in [facility] Video Visit from 10/16/2021 in [place] Video Visit from 09/15/2021 in [place] Video Visit from 08/05/2021 in [facility] Office Visit from 06/24/2021 in [facility]  PHQ-2 Total Score _1 0 2  PHQ-9 Total Score _2 Flowsheet Row Video Visit from 12/22/2021 in [place] Video Visit from 11/24/2021 in [place] Video Visit from 10/16/2021 in [facility] No Risk No Risk No Risk        Assessment and Plan: Stephanie Ayala is a 27 year old Caucasian female, married, lives in [place], has a history of MDD, GAD, ulcerative colitis, subclinical hypothyroidism, postpartum 10 days was evaluated by 10 medicine today.  Patient is currently stable.  Plan MDD in remission Cymbalta 30 mg p.o. twice daily. Discussed breast-feeding implications. [place] postnatal  depression scale-0.  GAD-stable Cymbalta 30 mg p.o. twice daily  Bereavement-improving Patient advised to establish care with her therapist again.  Follow-up in clinic in 2 months or sooner if needed.  Collaboration of Care: Collaboration of Care: Other encouraged to establish care with therapist.  Patient/Guardian was advised Release of Information must be obtained prior to any record release in order to collaborate their care with  an outside provider. Patient/Guardian was advised if they have not already done so to contact the registration department to sign all necessary forms in order for [place] to release information regarding their care.   Consent: Patient/Guardian gives verbal consent for treatment and assignment of benefits for services provided during this visit. Patient/Guardian expressed understanding and agreed to proceed.   This note was generated in part or whole with voice recognition software. Voice recognition is usually quite accurate but there are transcription errors that can and very often do occur. I apologize for any typographical errors that were not detected and corrected.      Ursula Alert, MD 12/23/2021, 8:28 AM

## 2022-01-07 NOTE — Telephone Encounter (Signed)
ERROR

## 2022-02-06 ENCOUNTER — Other Ambulatory Visit: Payer: Self-pay | Admitting: Psychiatry

## 2022-02-06 DIAGNOSIS — F411 Generalized anxiety disorder: Secondary | ICD-10-CM

## 2022-02-06 DIAGNOSIS — F3341 Major depressive disorder, recurrent, in partial remission: Secondary | ICD-10-CM

## 2022-02-10 ENCOUNTER — Ambulatory Visit (INDEPENDENT_AMBULATORY_CARE_PROVIDER_SITE_OTHER): Payer: Medicaid Other | Admitting: Gastroenterology

## 2022-02-10 ENCOUNTER — Encounter: Payer: Self-pay | Admitting: Gastroenterology

## 2022-02-10 VITALS — BP 110/78 | HR 89 | Temp 97.8°F | Ht 62.0 in | Wt 202.5 lb

## 2022-02-10 DIAGNOSIS — K5100 Ulcerative (chronic) pancolitis without complications: Secondary | ICD-10-CM | POA: Diagnosis not present

## 2022-02-10 NOTE — Progress Notes (Signed)
Cephas Darby, MD [address]  [place]  [place], [place] [address]  Main: [phone]  Fax: [phone]    Gastroenterology Consultation  Referring Provider:     Barbaraann Boys, MD Primary Care Physician:  Barbaraann Boys, MD Primary Gastroenterologist: [facility] Reason for Consultation:     Ulcerative colitis and elevated LFTs        HPI:   Stephanie Ayala is a 31 y.o. female referred by Dr. Barbaraann Boys, MD  for consultation & management of ulcerative colitis.  She is diagnosed in 2018 with chronic active colitis in cecum as well as in the rectum.  Patient was originally seen by me in 10/2020, underwent repeat colonoscopy which revealed moderate active chronic colitis in the cecum as well as in the rectum and inactive colitis in rest of the colon.  She was temporarily on prednisone, discontinued due to mood swings and irritability.  She was also on balsalazide 750 mg 3 times daily which did not help with symptoms, therefore discontinued it.  I discussed with her regarding initiation of Biologics, however her LFTs were elevated which I felt were secondary to underlying fatty liver, excess Tylenol use.  Patient stopped Tylenol use and also decreased dose of Cymbalta from 90 mg to 30 mg.  Her LFTs have normalized since decreasing the dose.  She is currently maintained on Apriso 0.375 mg 3 pills daily.  She is status postnormal spontaneous vaginal delivery on 12/12/2021.  Patient had postpartum worsening of symptoms of depression.  Has been closely followed by her psychiatrist, continuing on Cymbalta.  She reports intermittent mucus drainage and intermittent scant amounts of rectal bleeding.  Otherwise, her stools are generally formed with occasional constipation.  She is currently lactating.  Patient does not smoke or drink alcohol Patient reports pain in her knee joints, hands, denies any other extraintestinal manifestations  NSAIDs:  None  Antiplts/Anticoagulants/Anti thrombotics: None  GI Procedures: Colonoscopy 2018, report not available Pathology revealed chronic active colitis in cecum, chronic active proctitis and rectum  Colonoscopy 11/26/2020 - The examined portion of the ileum was normal. Biopsied. - Localized moderate inflammation was found in the cecum secondary to colitis. Biopsied. - Normal mucosa in the recto-sigmoid colon, in the sigmoid colon, in the descending colon, in the transverse colon and in the ascending colon. Biopsied. - Diffuse moderate inflammation was found in the distal rectum secondary to proctitis ulcerative colitis. Biopsied. - One 5 mm polyp in the descending colon, removed with a cold snare. Resected and retrieved.  DIAGNOSIS:  A. TERMINAL ILEUM, RANDOM; COLD BIOPSY:  - ENTERIC MUCOSA WITH NORMAL VILLOUS ARCHITECTURE AND REACTIVE LYMPHOID  HYPERPLASIA.  - NEGATIVE FOR ACTIVE ILEITIS.  - NEGATIVE FOR GRANULOMA, DYSPLASIA, AND MALIGNANCY.   B. COLON, RANDOM CECUM; COLD BIOPSY:  - CHRONIC COLITIS WITH MILD TO MODERATE ACTIVITY (CRYPTITIS AND FOCAL  CRYPT DISRUPTION).  - NEGATIVE FOR GRANULOMA, DYSPLASIA, AND MALIGNANCY.   C. COLON, RANDOM ASCENDING; COLD BIOPSY:  - CHRONIC COLITIS WITHOUT ACTIVITY.  - NEGATIVE FOR GRANULOMA, DYSPLASIA, AND MALIGNANCY.   D. COLON, RANDOM TRANSVERSE; COLD BIOPSY:  - CHRONIC COLITIS WITHOUT ACTIVITY.  - NEGATIVE FOR GRANULOMA, DYSPLASIA, AND MALIGNANCY.   E. COLON, RANDOM DESCENDING; COLD BIOPSY:  - CHRONIC COLITIS WITHOUT ACTIVITY.  - NEGATIVE FOR GRANULOMA, DYSPLASIA, AND MALIGNANCY.   F. COLON POLYP, DESCENDING; COLD SNARE:  - SESSILE SERRATED POLYP.  - NEGATIVE FOR DYSPLASIA AND MALIGNANCY.   G. COLON, RANDOM SIGMOID; COLD BIOPSY:  - CHRONIC COLITIS  WITHOUT ACTIVITY.  - NEGATIVE FOR GRANULOMA, DYSPLASIA, AND MALIGNANCY.   H. 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  -  NEGATIVE FOR GRANULOMA, DYSPLASIA, AND MALIGNANCY.   Past Medical History:  Diagnosis Date   Arthritis    Autoimmune disease ([place])    Colitis    Depression    Fibromyalgia     Past Surgical History:  Procedure Laterality Date   COLONOSCOPY WITH PROPOFOL N/A 11/26/2020   Procedure: COLONOSCOPY WITH PROPOFOL;  Surgeon: Lin Landsman, MD;  Location: [facility] ENDOSCOPY;  Service: Gastroenterology;  Laterality: N/A;   OVARY SURGERY Bilateral    cyst removal    Current Outpatient Medications:    acetaminophen (TYLENOL) 500 MG tablet, Take 500 mg by mouth every 6 (six) hours as needed., Disp: , Rfl:    DULoxetine (CYMBALTA) 30 MG capsule, Take 1 capsule (30 mg total) by mouth 2 (two) times daily., Disp: 180 capsule, Rfl: 0   levothyroxine (SYNTHROID) 50 MCG tablet, Take 1 tablet by mouth daily., Disp: , Rfl:    Magnesium 250 MG TABS, Take 500 mg by mouth., Disp: , Rfl:    mesalamine (APRISO) 0.375 g 24 hr capsule, Take 4 capsules (1.5 g total) by mouth in the morning and at bedtime., Disp: 240 capsule, Rfl: 2    Family History  Problem Relation Age of Onset   Anxiety disorder Mother    Diabetes Father    Hypertension Father    Diabetes Paternal Uncle    Heart disease Paternal Uncle    Alcohol abuse Maternal Grandfather    Heart disease Paternal Grandfather      Social History   Tobacco Use   Smoking status: Former    Packs/day: 0.00    Types: Cigarettes   Smokeless tobacco: Never  Vaping Use   Vaping Use: Some days  Substance Use Topics   Alcohol use: Not Currently   Drug use: No    Allergies as of 02/10/2022   (No Known Allergies)    Review of Systems:    All systems reviewed and negative except where noted in HPI.   Physical Exam:  BP 110/78 (BP Location: Left Arm, Patient Position: Sitting, Cuff Size: Normal)   Pulse 89   Temp 97.8 F (36.6 C) (Oral)   Ht 5' 2"  (1.575 m)   Wt 202 lb 8 oz (91.9 kg)   LMP 03/31/2021   BMI 37.04 kg/m  Patient's last  menstrual period was 03/31/2021.  General:   Alert,  Well-developed, well-nourished, pleasant and cooperative in NAD Head:  Normocephalic and atraumatic. Eyes:  Sclera clear, no icterus.   Conjunctiva pink. Ears:  Normal auditory acuity. Nose:  No deformity, discharge, or lesions. Mouth:  No deformity or lesions,oropharynx pink & moist. Neck:  Supple; no masses or thyromegaly. Lungs:  Respirations even and unlabored.  Clear throughout to auscultation.   No wheezes, crackles, or rhonchi. No acute distress. Heart:  Regular rate and rhythm; no murmurs, clicks, rubs, or gallops. Abdomen:  Normal bowel sounds. Soft, non-tender and non-distended without masses, hepatosplenomegaly or hernias noted.  No guarding or rebound tenderness.   Rectal: Not performed Msk:  Symmetrical without gross deformities. Good, equal movement & strength bilaterally. Pulses:  Normal pulses noted. Extremities:  No clubbing or edema.  No cyanosis. Neurologic:  Alert and oriented x3;  grossly normal neurologically. Skin:  Intact without significant lesions or rashes. No jaundice. Psych:  Alert and cooperative. Normal mood and  affect.  Imaging Studies: None  Assessment and Plan:   Stephanie Ayala is a 31 y.o. pleasant Caucasian female with history of ulcerative pancolitis diagnosed in 2018 is currently maintained on Apriso is seen in for follow-up.  Patient is postpartum, had normal spontaneous vaginal delivery on 12/12/2021.  Ulcerative pan colitis  Colonoscopy confirmed chronic moderately active colitis in the cecum as well as rectum and inactive colitis in rest of the colon.  TI biopsies normal GI profile PCR negative, calprotectin levels within normal range as of 11/2020 TPMT levels confirmed homozygosity Discontinued balsalazide due to lack of response Patient did not tolerate prednisone She was maintained on Apriso throughout her pregnancy and continuing the same Advised patient to increase Apriso 0.375 mg 3  pills twice daily Recheck fecal calprotectin levels Discussed with patient regarding repeat colonoscopy to assess response on Apriso and to discuss about escalation of therapy if needed   Follow up in 3-4 months   Cephas Darby, MD

## 2022-02-25 ENCOUNTER — Telehealth: Payer: Self-pay

## 2022-02-25 NOTE — Telephone Encounter (Signed)
Submitted PA through Cover my meds for patient Mesalamine ER 0.375gm. attached last office visit and waiting on response from insurance company

## 2022-02-25 NOTE — Telephone Encounter (Signed)
Mesalamine has been approved till 02/25/23

## 2022-02-26 ENCOUNTER — Telehealth (INDEPENDENT_AMBULATORY_CARE_PROVIDER_SITE_OTHER): Payer: Medicaid Other | Admitting: Psychiatry

## 2022-02-26 ENCOUNTER — Encounter: Payer: Self-pay | Admitting: Psychiatry

## 2022-02-26 DIAGNOSIS — F411 Generalized anxiety disorder: Secondary | ICD-10-CM

## 2022-02-26 DIAGNOSIS — F3341 Major depressive disorder, recurrent, in partial remission: Secondary | ICD-10-CM

## 2022-02-26 DIAGNOSIS — Z634 Disappearance and death of family member: Secondary | ICD-10-CM | POA: Diagnosis not present

## 2022-02-26 MED ORDER — DULOXETINE HCL 30 MG PO CPEP: 30.0000 mg | ORAL_CAPSULE | Freq: Two times a day (BID) | ORAL | 0 refills | Status: DC

## 2022-02-26 NOTE — Progress Notes (Signed)
Virtual Visit via Video Note  I connected with Stephanie Ayala on 02/26/22 at  4:00 PM EST by a video enabled telemedicine application and verified that I am speaking with the correct person using two identifiers.  Location Provider Location : [facility] Patient Location : Home  Participants: Patient , Provider    I discussed the limitations of evaluation and management by telemedicine and the availability of in person appointments. The patient expressed understanding and agreed to proceed.   I discussed the assessment and treatment plan with the patient. The patient was provided an opportunity to ask questions and all were answered. The patient agreed with the plan and demonstrated an understanding of the instructions.   The patient was advised to call back or seek an in-person evaluation if the symptoms worsen or if the condition fails to improve as anticipated.  [place] MD OP Progress Note  02/26/2022 5:23 PM Stephanie Ayala  MRN:  [id number]  Chief Complaint:  Chief Complaint  Patient presents with   Follow-up   Medication Refill   Anxiety   Depression   HPI: Stephanie Ayala is a 31 year old Caucasian female, married, lives in [place], has a history of MDD, GAD, insomnia, ulcerative colitis, hypothyroidism, currently postpartum 2-1/2 months was evaluated by telemedicine today.  Patient today reports she is currently extremely anxious.  She reports it has been stressful taking care of her 2-1/52-monthold baby as well as her 260year-old.  She however reports she does have support system from her mother and her spouse.  Patient reports sleep is also restless since baby has not started sleeping through the night.  She continues to breast-feed.  Patient denies any depression symptoms.  Reports appetite is fair.  Currently compliant on the Cymbalta, denies side effects.  Denies any current suicidality, homicidality or perceptual disturbances.  Patient has not been able to establish care with a  therapist yet, however agreeable to be referred to the therapist here at the practice.  Patient denies any other concerns today.  Visit Diagnosis:    ICD-10-CM   1. MDD (major depressive disorder), recurrent, in partial remission (HCC)  F33.41 DULoxetine (CYMBALTA) 30 MG capsule    2. GAD (generalized anxiety disorder)  F41.1 DULoxetine (CYMBALTA) 30 MG capsule    3. Bereavement  Z63.4       Past Psychiatric History: Reviewed past psychiatric history from progress note on 12/21/2019.  Past Medical History:  Past Medical History:  Diagnosis Date   Arthritis    Autoimmune disease ([place]    Colitis    Depression    Fibromyalgia     Past Surgical History:  Procedure Laterality Date   COLONOSCOPY WITH PROPOFOL N/A 11/26/2020   Procedure: COLONOSCOPY WITH PROPOFOL;  Surgeon: VLin Landsman MD;  [facility]  Service: Gastroenterology;  Laterality: N/A;   OVARY SURGERY Bilateral    cyst removal    Family Psychiatric History: Reviewed the psychiatric history from progress note on 12/21/2019.  Family History:  Family History  Problem Relation Age of Onset   Anxiety disorder Mother    Diabetes Father    Hypertension Father    Diabetes Paternal Uncle    Heart disease Paternal Uncle    Alcohol abuse Maternal Grandfather    Heart disease Paternal Grandfather     Social History: Reviewed social history from progress note on 12/21/2019. Social History   Socioeconomic History   Marital status: Married    Spouse name:  Dylan   Number of children:  Not on file   Years of education: Not on file   Highest education level: Not on file  Occupational History   Occupation: Not employed  Tobacco Use   Smoking status: Former    Packs/day: 0.00    Types: Cigarettes   Smokeless tobacco: Never  Vaping Use   Vaping Use: Some days  Substance and Sexual Activity   Alcohol use: Not Currently   Drug use: No   Sexual activity: Yes    Birth control/protection: I.U.D.   Other Topics Concern   Not on file  Social History Narrative   Lives in [place]; with daughter/room-mate. No smoking; no alcohol. On retirement disability.    Social Determinants of Health   Financial Resource Strain: Not on file  Food Insecurity: Not on file  Transportation Needs: Not on file  Physical Activity: Not on file  Stress: Not on file  Social Connections: Not on file    Allergies: No Known Allergies  Metabolic Disorder Labs: No results found for: "HGBA1C", "MPG" No results found for: "PROLACTIN" Lab Results  Component Value Date   CHOL 204 (H) 02/12/2017   TRIG 327 (H) 11/13/2019   HDL 53 02/12/2017   CHOLHDL 3.8 02/12/2017   LDLCALC 138 (H) 02/12/2017   Lab Results  Component Value Date   TSH 3.040 02/12/2017    Therapeutic Level Labs: No results found for: "LITHIUM" No results found for: "VALPROATE" No results found for: "CBMZ"  Current Medications: Current Outpatient Medications  Medication Sig Dispense Refill   acetaminophen (TYLENOL) 500 MG tablet Take 500 mg by mouth every 6 (six) hours as needed.     levothyroxine (SYNTHROID) 50 MCG tablet Take 1 tablet by mouth daily.     Magnesium 250 MG TABS Take 500 mg by mouth.     mesalamine (APRISO) 0.375 g 24 hr capsule Take 4 capsules (1.5 g total) by mouth in the morning and at bedtime. 240 capsule 2   DULoxetine (CYMBALTA) 30 MG capsule Take 1 capsule (30 mg total) by mouth 2 (two) times daily. 180 capsule 0   No current facility-administered medications for this visit.     Musculoskeletal: Strength & Muscle Tone:  UTA Gait & Station:  Seated Patient leans: N/A  Psychiatric Specialty Exam: Review of Systems  Psychiatric/Behavioral:  Positive for sleep disturbance. The patient is nervous/anxious.   All other systems reviewed and are negative.   Last menstrual period 03/31/2021, currently breastfeeding.There is no height or weight on file to calculate BMI.  General Appearance: Casual  Eye  Contact:  Fair  Speech:  Clear and Coherent  Volume:  Normal  Mood:  Anxious  Affect:  Congruent  Thought Process:  Goal Directed and Descriptions of Associations: Intact  Orientation:  Full (Time, Place, and Person)  Thought Content: Logical   Suicidal Thoughts:  No  Homicidal Thoughts:  No  Memory:  Immediate;   Fair Recent;   Fair Remote;   Fair  Judgement:  Fair  Insight:  Fair  Psychomotor Activity:  Normal  Concentration:  Concentration: Fair and Attention Span: Fair  Recall:  [employer] of Knowledge: Fair  Language: Fair  Akathisia:  No  Handed:  Right  AIMS (if indicated): not done  Assets:  Communication Skills Desire for Improvement Housing Social Support  ADL's:  Intact  Cognition: WNL  Sleep:   restless due to having a newborn baby   Screenings: [place] Video Visit from 08/05/2021 in [place]  AIMS Total Score 0      GAD-7    Flowsheet Row Video Visit from 02/26/2022 in [place] Video Visit from 10/16/2021 in [facility] Video Visit from 08/05/2021 in [facility] Video Visit from 05/08/2021 in [facility] Video Visit from 03/27/2021 in [place]  Total GAD-7 Score 19 14 4 4 19       PHQ2-9    Flowsheet Row Video Visit from 02/26/2022 in [facility] Video Visit from 11/24/2021 in [place] [facility] Video Visit from 10/16/2021 in [place] Video Visit from 09/15/2021 in [facility] Video Visit from 08/05/2021 in [facility]  PHQ-2 Total Score 0 3 4 1  0  PHQ-9 Total Score -- 7 19 8 5       Flowsheet Row Video Visit from 02/26/2022 in [place] Video Visit from 12/22/2021 in [facility] Video Visit from 11/24/2021 in [place] No Risk No Risk No Risk        Assessment and Plan: Stephanie Ayala is a 3 year old Caucasian female, married, lives in [place], has a history of MDD, GAD, ulcerative colitis,  hypothyroidism, postpartum was evaluated by telemedicine today.  Patient is currently struggling with anxiety, will benefit from the following plan.   Plan MDD in remission Cymbalta 30 mg p.o. twice daily Patient aware about breast-feeding implications of duloxetine.  GAD-unstable Cymbalta 30 mg p.o. twice daily. Patient is not interested in dosage readjustment or addition of a new medication since she is breast-feeding. Patient is interested in establishing care with a therapist, she has not been able to establish care with a therapist yet.  I have communicated with staff here to schedule this patient with our new therapist.  Bereavement-improving Patient advised to establish care with therapist.  Follow-up in clinic in 2 months or sooner if needed.   Collaboration of Care: Collaboration of Care: Referral or follow-up with counselor/therapist AEB encouraged to establish care with therapist as well as I have communicated with staff here to contact this patient to schedule an appointment with a therapist.  Patient/Guardian was advised Release of Information must be obtained prior to any record release in order to collaborate their care with an outside provider. Patient/Guardian was advised if they have not already done so to contact the registration department to sign all necessary forms in order for [place] to release information regarding their care.   Consent: Patient/Guardian gives verbal consent for treatment and assignment of benefits for services provided during this visit. Patient/Guardian expressed understanding and agreed to proceed.   This note was generated in part or whole with voice recognition  software. Voice recognition is usually quite accurate but there are transcription errors that can and very often do occur. I apologize for any typographical errors that were not detected and corrected.     Ursula Alert, MD 02/26/2022, 5:23 PM

## 2022-03-05 ENCOUNTER — Other Ambulatory Visit: Payer: Self-pay | Admitting: Gastroenterology

## 2022-03-05 DIAGNOSIS — K51011 Ulcerative (chronic) pancolitis with rectal bleeding: Secondary | ICD-10-CM

## 2022-03-05 MED ORDER — MESALAMINE ER 0.375 G PO CP24: 1500.0000 mg | ORAL_CAPSULE | Freq: Two times a day (BID) | ORAL | 2 refills | Status: DC

## 2022-03-19 ENCOUNTER — Ambulatory Visit: Payer: Medicaid Other | Admitting: Licensed Clinical Social Worker

## 2022-04-28 ENCOUNTER — Encounter: Payer: Self-pay | Admitting: Psychiatry

## 2022-04-28 ENCOUNTER — Telehealth (INDEPENDENT_AMBULATORY_CARE_PROVIDER_SITE_OTHER): Payer: Medicaid Other | Admitting: Psychiatry

## 2022-04-28 DIAGNOSIS — F3342 Major depressive disorder, recurrent, in full remission: Secondary | ICD-10-CM

## 2022-04-28 DIAGNOSIS — Z634 Disappearance and death of family member: Secondary | ICD-10-CM | POA: Diagnosis not present

## 2022-04-28 DIAGNOSIS — F411 Generalized anxiety disorder: Secondary | ICD-10-CM

## 2022-04-28 MED ORDER — PROPRANOLOL HCL 10 MG PO TABS: 10.0000 mg | ORAL_TABLET | Freq: Every day | ORAL | 1 refills | Status: DC | PRN

## 2022-04-28 NOTE — Progress Notes (Unsigned)
Virtual Visit via Video Note  I connected with Stephanie Ayala on 04/28/22 at  4:30 PM EST by a video enabled telemedicine application and verified that I am speaking with the correct person using two identifiers.  Location Provider Location : [facility] Patient Location : Home  Participants: Patient , Provider    I discussed the limitations of evaluation and management by telemedicine and the availability of in person appointments. The patient expressed understanding and agreed to proceed.   I discussed the assessment and treatment plan with the patient. The patient was provided an opportunity to ask questions and all were answered. The patient agreed with the plan and demonstrated an understanding of the instructions.   The patient was advised to call back or seek an in-person evaluation if the symptoms worsen or if the condition fails to improve as anticipated.    [place] MD OP Progress Note  04/28/2022 5:01 PM Stephanie Ayala  MRN:  [id number]  Chief Complaint:  Chief Complaint  Patient presents with   Follow-up   Medication Refill   Anxiety   Depression   HPI: Stephanie Ayala is a 32 year old Caucasian female, married, lives in [place], has a history of MDD, GAD, insomnia, ulcerative colitis, hypothyroidism, currently postpartum 4 months was evaluated by telemedicine today.  Patient today reports she is currently overall doing fairly well with regards to depression symptoms.  She however does report she continues to be extremely anxious.  She reports she stays busy all the time, taking care of her 4-monthold as well as her 32year-old who needs a lot of support.  Patient reports her husband keeps the 456-monthld when he comes back home and that does help to some extent however she still stays busy.  This does make her anxiety worse since she has been juggling a lot of things.  She does have support from her mother and that helps.  She has started psychotherapy session with telehealth provider-Ms.  JeTawanna Ayala 2 sessions.  Looks forward to the next session.  Reports sessions are beneficial.  Patient denies any side effects to Cymbalta.  Currently compliant on it.  Continues to have sleep problems mostly due to having an infant.  Currently working on sleep hygiene.  Denies any suicidality, homicidality or perceptual disturbances.  Patient denies any other concerns today.  Visit Diagnosis:    ICD-10-CM   1. MDD (major depressive disorder), recurrent, in partial remission (HCC)  F33.41 propranolol (INDERAL) 10 MG tablet    2. GAD (generalized anxiety disorder)  F41.1 propranolol (INDERAL) 10 MG tablet    3. Bereavement  Z63.4       Past Psychiatric History: Reviewed past psychiatric history from progress note on 12/21/2019.  Past Medical History:  Past Medical History:  Diagnosis Date   Arthritis    Autoimmune disease ([place]   Colitis    Depression    Fibromyalgia     Past Surgical History:  Procedure Laterality Date   COLONOSCOPY WITH PROPOFOL N/A 11/26/2020   Procedure: COLONOSCOPY WITH PROPOFOL;  Surgeon: VaLin LandsmanMD;  [facility];  Service: Gastroenterology;  Laterality: N/A;   OVARY SURGERY Bilateral    cyst removal    Family Psychiatric History: Reviewed family psychiatric history from progress note on 12/21/2019.  Family History:  Family History  Problem Relation Age of Onset   Anxiety disorder Mother    Diabetes Father    Hypertension Father    Diabetes Paternal Uncle    Heart disease Paternal  Uncle    Alcohol abuse Maternal Grandfather    Heart disease Paternal Grandfather     Social History: Reviewed social history from progress note on 12/21/2019. Social History   Socioeconomic History   Marital status: Married    Spouse name:  Dylan   Number of children: Not on file   Years of education: Not on file   Highest education level: Not on file  Occupational History   Occupation: Not employed  Tobacco Use   Smoking status:  Former    Packs/day: 0.00    Types: Cigarettes   Smokeless tobacco: Never  Vaping Use   Vaping Use: Some days  Substance and Sexual Activity   Alcohol use: Not Currently   Drug use: No   Sexual activity: Yes    Birth control/protection: I.U.D.  Other Topics Concern   Not on file  Social History Narrative   Lives in [place]; with daughter/room-mate. No smoking; no alcohol. On retirement disability.    Social Determinants of Health   Financial Resource Strain: Not on file  Food Insecurity: Not on file  Transportation Needs: Not on file  Physical Activity: Not on file  Stress: Not on file  Social Connections: Not on file    Allergies: No Known Allergies  Metabolic Disorder Labs: No results found for: "HGBA1C", "MPG" No results found for: "PROLACTIN" Lab Results  Component Value Date   CHOL 204 (H) 02/12/2017   TRIG 327 (H) 11/13/2019   HDL 53 02/12/2017   CHOLHDL 3.8 02/12/2017   LDLCALC 138 (H) 02/12/2017   Lab Results  Component Value Date   TSH 3.040 02/12/2017    Therapeutic Level Labs: No results found for: "LITHIUM" No results found for: "VALPROATE" No results found for: "CBMZ"  Current Medications: Current Outpatient Medications  Medication Sig Dispense Refill   propranolol (INDERAL) 10 MG tablet Take 1 tablet (10 mg total) by mouth daily as needed. For anxiety symptoms 30 tablet 1   acetaminophen (TYLENOL) 500 MG tablet Take 500 mg by mouth every 6 (six) hours as needed.     DULoxetine (CYMBALTA) 30 MG capsule Take 1 capsule (30 mg total) by mouth 2 (two) times daily. 180 capsule 0   levothyroxine (SYNTHROID) 50 MCG tablet Take 1 tablet by mouth daily.     Magnesium 250 MG TABS Take 500 mg by mouth.     mesalamine (APRISO) 0.375 g 24 hr capsule Take 4 capsules (1.5 g total) by mouth in the morning and at bedtime. 240 capsule 2   No current facility-administered medications for this visit.     Musculoskeletal: Strength & Muscle Tone:  UTA Gait &  Station: normal Patient leans: N/A  Psychiatric Specialty Exam: Review of Systems  Psychiatric/Behavioral:  Positive for sleep disturbance. The patient is nervous/anxious.   All other systems reviewed and are negative.   currently breastfeeding.There is no height or weight on file to calculate BMI.  General Appearance: Casual  Eye Contact:  Fair  Speech:  Clear and Coherent  Volume:  Normal  Mood:  Anxious  Affect:  Congruent  Thought Process:  Goal Directed and Descriptions of Associations: Intact  Orientation:  Full (Time, Place, and Person)  Thought Content: Logical   Suicidal Thoughts:  No  Homicidal Thoughts:  No  Memory:  Immediate;   Fair Recent;   Fair Remote;   Fair  Judgement:  Fair  Insight:  Fair  Psychomotor Activity:  Normal  Concentration:  Concentration: Fair and Attention Span: Fair  Recall:  [employer] of Knowledge: Fair  Language: Fair  Akathisia:  No  Handed:  Right  AIMS (if indicated): not done  Assets:  Communication Skills Desire for Improvement Housing Social Support  ADL's:  Intact  Cognition: WNL  Sleep:   Improving   Screenings: AIMS    Flowsheet Row Video Visit from 08/05/2021 in [place] Total Score 0      GAD-7    Flowsheet Row Video Visit from 02/26/2022 in [facility] Video Visit from 10/16/2021 in [place] Video Visit from 08/05/2021 in [place] Video Visit from 05/08/2021 in [facility] Video Visit from 03/27/2021 in [facility]  Total GAD-7 Score '19 14 4 4 19      '$ PHQ2-9    Flowsheet Row Video Visit from 02/26/2022 in [facility] Video Visit from 11/24/2021 in [facility] [place] Video Visit from  10/16/2021 in [place] Video Visit from 09/15/2021 in [facility] Video Visit from 08/05/2021 in [place] [facility]  PHQ-2 Total Score 0 '3 4 1 '$ 0  PHQ-9 Total Score -- '7 19 8 5      '$ Flowsheet Row Video Visit from 02/26/2022 in [place] Video Visit from 12/22/2021 in [facility] Video Visit from 11/24/2021 in [place] No Risk No Risk No Risk        Assessment and Plan: Stephanie Ayala is a 39 year old Caucasian female, married, lives in [place], has a history of MDD, GAD, ulcerative colitis, hypothyroidism, postpartum 4 months was evaluated by telemedicine today.  Patient is currently struggling with anxiety, not interested in dosage increase of Cymbalta, will benefit from the following plan.  Plan MDD in remission Cymbalta 30 mg p.o. twice daily Patient aware about breast-feeding implications of Cymbalta.  GAD-unstable Cymbalta 30 mg p.o. twice daily Start propranolol 10 mg p.o. daily as needed Patient to limit use.  Discussed breast-feeding implications. Continue CBT.  Bereavement-improving Continue CBT   Follow-up in clinic in 2 months or sooner if needed.    Collaboration of Care: Collaboration of Care: Referral or follow-up with counselor/therapist AEB patient advised to continue follow-up with therapist.  Patient/Guardian was advised Release of Information must be obtained prior to any record release in order to collaborate their care with an outside provider. Patient/Guardian was advised if they have not already done so to contact the registration department to sign all necessary forms in order for [place] to release information regarding their care.   Consent: Patient/Guardian gives verbal consent for treatment and  assignment of benefits for services provided during this visit. Patient/Guardian expressed understanding and agreed to proceed.    This note was generated in part or whole with voice recognition software. Voice recognition is usually quite accurate but there are transcription errors that can and very often do occur. I apologize for any typographical errors that were not detected and corrected.     Ursula Alert, MD 04/28/2022, 5:01 PM

## 2022-04-28 NOTE — Patient Instructions (Signed)
Propranolol Tablets What is this medication? PROPRANOLOL (proe PRAN oh lole) treats many conditions such as high blood pressure, tremors, and a type of arrhythmia known as AFib (atrial fibrillation). It works by lowering your blood pressure and heart rate, making it easier for your heart to pump blood to the rest of your body. It may be used to prevent migraine headaches. It works by relaxing the blood vessels in the brain that cause migraines. It belongs to a group of medications called beta blockers. This medicine may be used for other purposes; ask your health care provider or pharmacist if you have questions. COMMON BRAND NAME(S): Inderal What should I tell my care team before I take this medication? They need to know if you have any of these conditions: Circulation problems or blood vessel disease Diabetes History of heart attack or heart disease, vasospastic angina Kidney disease Liver disease Lung or breathing disease, like asthma or emphysema Pheochromocytoma Slow heart rate Thyroid disease An unusual or allergic reaction to propranolol, other beta-blockers, medications, foods, dyes, or preservatives Pregnant or trying to get pregnant Breast-feeding How should I use this medication? Take this medication by mouth. Take it as directed on the prescription label at the same time every day. Keep taking it unless your care team tells you to stop. Talk to your care team about the use of this medication in children. Special care may be needed. Overdosage: If you think you have taken too much of this medicine contact a poison control center or emergency room at once. NOTE: This medicine is only for you. Do not share this medicine with others. What if I miss a dose? If you miss a dose, take it as soon as you can. If it is almost time for your next dose, take only that dose. Do not take double or extra doses. What may interact with this medication? Do not take this medication with any of the  following: Feverfew Phenothiazines like chlorpromazine, mesoridazine, prochlorperazine, thioridazine This medication may also interact with the following: Aluminum hydroxide gel Antipyrine Antiviral medications for HIV or AIDS Barbiturates like phenobarbital Certain medications for blood pressure, heart disease, irregular heart beat Cimetidine Ciprofloxacin Diazepam Fluconazole Haloperidol Isoniazid Medications for cholesterol like cholestyramine or colestipol Medications for mental depression Medications for migraine headache like almotriptan, eletriptan, frovatriptan, naratriptan, rizatriptan, sumatriptan, zolmitriptan NSAIDs, medications for pain and inflammation, like ibuprofen or naproxen Phenytoin Rifampin Teniposide Theophylline Thyroid medications Tolbutamide Warfarin Zileuton This list may not describe all possible interactions. Give your health care provider a list of all the medicines, herbs, non-prescription drugs, or dietary supplements you use. Also tell them if you smoke, drink alcohol, or use illegal drugs. Some items may interact with your medicine. What should I watch for while using this medication? Visit your care team for regular checks on your progress. Check your blood pressure as directed. Ask your care team what your blood pressure should be. Also, find out when you should contact him or her. Do not treat yourself for coughs, colds, or pain while you are using this medication without asking your care team for advice. Some medications may increase your blood pressure. You may get drowsy or dizzy. Do not drive, use machinery, or do anything that needs mental alertness until you know how this medication affects you. Do not stand up or sit up quickly, especially if you are an older patient. This reduces the risk of dizzy or fainting spells. Alcohol may interfere with the effect of this medication. Avoid alcoholic drinks.   This medication may increase blood sugar.  Ask your care team if changes in diet or medications are needed if you have diabetes. What side effects may I notice from receiving this medication? Side effects that you should report to your care team as soon as possible: Allergic reactions--skin rash, itching, hives, swelling of the face, lips, tongue, or throat Heart failure--shortness of breath, swelling of the ankles, feet, or hands, sudden weight gain, unusual weakness or fatigue Low blood pressure--dizziness, feeling faint or lightheaded, blurry vision Raynaud's--cool, numb, or painful fingers or toes that may change color from pale, to blue, to red Redness, blistering, peeling, or loosening of the skin, including inside the mouth Slow heartbeat--dizziness, feeling faint or lightheaded, confusion, trouble breathing, unusual weakness or fatigue Worsening mood, feelings of depression Side effects that usually do not require medical attention (report to your care team if they continue or are bothersome): Change in sex drive or performance Diarrhea Dizziness Fatigue Headache This list may not describe all possible side effects. Call your doctor for medical advice about side effects. You may report side effects to FDA at 1-800-FDA-1088. Where should I keep my medication? Keep out of the reach of children and pets. Store at room temperature between 20 and 25 degrees C (68 and 77 degrees F). Protect from light. Throw away any unused medication after the expiration date. NOTE: This sheet is a summary. It may not cover all possible information. If you have questions about this medicine, talk to your doctor, pharmacist, or health care provider.  2023 Elsevier/Gold Standard (2020-06-23 00:00:00)  

## 2022-05-25 ENCOUNTER — Ambulatory Visit: Payer: Medicaid Other | Admitting: Gastroenterology

## 2022-05-25 ENCOUNTER — Encounter: Payer: Self-pay | Admitting: Gastroenterology

## 2022-05-25 ENCOUNTER — Other Ambulatory Visit: Payer: Self-pay | Admitting: Psychiatry

## 2022-05-25 DIAGNOSIS — F3342 Major depressive disorder, recurrent, in full remission: Secondary | ICD-10-CM

## 2022-05-25 DIAGNOSIS — F411 Generalized anxiety disorder: Secondary | ICD-10-CM

## 2022-06-22 ENCOUNTER — Encounter: Payer: Self-pay | Admitting: Psychiatry

## 2022-06-22 ENCOUNTER — Telehealth (INDEPENDENT_AMBULATORY_CARE_PROVIDER_SITE_OTHER): Payer: Medicaid Other | Admitting: Psychiatry

## 2022-06-22 DIAGNOSIS — Z79899 Other long term (current) drug therapy: Secondary | ICD-10-CM | POA: Insufficient documentation

## 2022-06-22 DIAGNOSIS — F331 Major depressive disorder, recurrent, moderate: Secondary | ICD-10-CM

## 2022-06-22 DIAGNOSIS — F411 Generalized anxiety disorder: Secondary | ICD-10-CM | POA: Diagnosis not present

## 2022-06-22 NOTE — Progress Notes (Signed)
Virtual Visit via Video Note  I connected with Stephanie Ayala on 06/22/22 at  1:00 PM EDT by a video enabled telemedicine application and verified that I am speaking with the correct person using two identifiers.  Location Provider Location : Remote Office Patient Location : Home  Participants: Patient , Provider   I discussed the limitations of evaluation and management by telemedicine and the availability of in person appointments. The patient expressed understanding and agreed to proceed.    I discussed the assessment and treatment plan with the patient. The patient was provided an opportunity to ask questions and all were answered. The patient agreed with the plan and demonstrated an understanding of the instructions.   The patient was advised to call back or seek an in-person evaluation if the symptoms worsen or if the condition fails to improve as anticipated.  [facility] MD OP Progress Note  06/22/2022 2:40 PM KHALEYA Ayala  MRN:  [id number]  Chief Complaint:  Chief Complaint  Patient presents with   Follow-up   Depression   Medication Refill   HPI: Stephanie Ayala is a 32 year old Caucasian female, married, lives in [place], has a history of MDD, GAD, insomnia, ulcerative colitis, hypothyroidism, currently postpartum almost 6 months was evaluated by telemedicine today.  Patient today reports lately she has been feeling more and more overwhelmed, frustrated.  She reports it has been hard taking care of both children, her younger one who is almost 6 months and her older child is 55 years old.  Patient reports although her mother is there to help her her, mother works from home and she feels guilty asking her mother for help.  Patient does have a friend who comes over.  Patient reports when her friend comes over she tries to clean up and organize her house.  Patient reports the last couple of nights she has not slept well since her 47-month-old baby is not sleeping well.  That does keep her  awake.  Patient reports other stressors like the death of a cousin recently as well as her dad having triple bypass surgery recently.  Patient reports it was brought to her attention by her spouse that she likely is more depressed lately.  Patient currently struggles with sadness, low energy, low motivation, concentration problem, sleep problems as noted above.  Denies any suicidality, homicidality or perceptual disturbances.  Patient is currently compliant on medications.  Denies side effects.  However labs completed-04/28/2022-reviewed and discussed with patient AST/ALT elevated.  Discussed with patient the effect of duloxetine on her hepatic function.  Patient is agreeable to repeat hepatic function panel prior to making further medication changes.  Patient reports she has established care with a therapist, motivated to stay in therapy.  Patient continues to breast-feed.  She reports she was recently started on a higher dose to propranolol 60 mg daily for her headaches.  Patient denies any other concerns today.  Visit Diagnosis:    ICD-10-CM   1. MDD (major depressive disorder), recurrent episode, moderate (HCC)  F33.1 Hepatic function panel    2. GAD (generalized anxiety disorder)  F41.1 Hepatic function panel    3. High risk medication use  Z79.899 Hepatic function panel      Past Psychiatric History: Reviewed past psychiatric history from progress note on 12/21/2019.  Past Medical History:  Past Medical History:  Diagnosis Date   Arthritis    Autoimmune disease ([place])    Colitis    Depression    Fibromyalgia  Past Surgical History:  Procedure Laterality Date   COLONOSCOPY WITH PROPOFOL N/A 11/26/2020   Procedure: COLONOSCOPY WITH PROPOFOL;  Surgeon: Lin Landsman, MD;  Location[facility] ENDOSCOPY;  Service: Gastroenterology;  Laterality: N/A;   OVARY SURGERY Bilateral    cyst removal    Family Psychiatric History: Reviewed family psychiatric history from progress note  on 12/21/2019.  Family History:  Family History  Problem Relation Age of Onset   Anxiety disorder Mother    Diabetes Father    Hypertension Father    Diabetes Paternal Uncle    Heart disease Paternal Uncle    Alcohol abuse Maternal Grandfather    Heart disease Paternal Grandfather     Social History: Reviewed social history from progress note on 12/21/2019. Social History   Socioeconomic History   Marital status: Married    Spouse name:  Dylan   Number of children: Not on file   Years of education: Not on file   Highest education level: Not on file  Occupational History   Occupation: Not employed  Tobacco Use   Smoking status: Former    Packs/day: 0    Types: Cigarettes   Smokeless tobacco: Never  Vaping Use   Vaping Use: Some days  Substance and Sexual Activity   Alcohol use: Not Currently   Drug use: No   Sexual activity: Yes    Birth control/protection: I.U.D.  Other Topics Concern   Not on file  Social History Narrative   Lives in [place]; with daughter/room-mate. No smoking; no alcohol. On retirement disability.    Social Determinants of Health   Financial Resource Strain: Not on file  Food Insecurity: Not on file  Transportation Needs: Not on file  Physical Activity: Not on file  Stress: Not on file  Social Connections: Not on file    Allergies: No Known Allergies  Metabolic Disorder Labs: No results found for: "HGBA1C", "MPG" No results found for: "PROLACTIN" Lab Results  Component Value Date   CHOL 204 (H) 02/12/2017   TRIG 327 (H) 11/13/2019   HDL 53 02/12/2017   CHOLHDL 3.8 02/12/2017   LDLCALC 138 (H) 02/12/2017   Lab Results  Component Value Date   TSH 3.040 02/12/2017    Therapeutic Level Labs: No results found for: "LITHIUM" No results found for: "VALPROATE" No results found for: "CBMZ"  Current Medications: Current Outpatient Medications  Medication Sig Dispense Refill   ascorbic acid (VITAMIN C) 500 MG tablet Take by mouth.      Cholecalciferol (VITAMIN D-1000 MAX ST) 25 MCG (1000 UT) tablet Take by mouth.     DULoxetine (CYMBALTA) 30 MG capsule Take 1 capsule (30 mg total) by mouth 2 (two) times daily. 180 capsule 0   ferrous sulfate 325 (65 FE) MG EC tablet Take by mouth.     levothyroxine (SYNTHROID) 50 MCG tablet Take 1 tablet by mouth daily.     Magnesium 250 MG TABS Take 500 mg by mouth.     mesalamine (APRISO) 0.375 g 24 hr capsule Take 4 capsules (1.5 g total) by mouth in the morning and at bedtime. 240 capsule 2   propranolol (INDERAL) 10 MG tablet Take 1 tablet (10 mg total) by mouth daily as needed. For anxiety symptoms (Patient taking differently: Take 60 mg by mouth daily. For anxiety symptoms) 90 tablet 0   SUMAtriptan (IMITREX) 100 MG tablet PLEASE SEE ATTACHED FOR DETAILED DIRECTIONS     No current facility-administered medications for this visit.     Musculoskeletal: Strength &  Muscle Tone:  UTA Gait & Station:  Seated Patient leans: N/A  Psychiatric Specialty Exam: Review of Systems  Psychiatric/Behavioral:  Positive for dysphoric mood and sleep disturbance. The patient is nervous/anxious.   All other systems reviewed and are negative.   currently breastfeeding.There is no height or weight on file to calculate BMI.  General Appearance: Casual  Eye Contact:  Fair  Speech:  Clear and Coherent  Volume:  Normal  Mood:  Anxious and Depressed  Affect:  Congruent  Thought Process:  Goal Directed and Descriptions of Associations: Intact  Orientation:  Full (Time, Place, and Person)  Thought Content: Logical   Suicidal Thoughts:  No  Homicidal Thoughts:  No  Memory:  Immediate;   Fair Recent;   Fair Remote;   Fair  Judgement:  Fair  Insight:  Fair  Psychomotor Activity:  Normal  Concentration:  Concentration: Fair and Attention Span: Fair  Recall:  [employer] of Knowledge: Fair  Language: Fair  Akathisia:  No  Handed:  Right  AIMS (if indicated): not done  Assets:  Communication  Skills Desire for Improvement Social Support  ADL's:  Intact  Cognition: WNL  Sleep:  Poor   Screenings: AIMS    Flowsheet Row Video Visit from 08/05/2021 in [place] Total Score 0      GAD-7    Flowsheet Row Video Visit from 02/26/2022 in [place] Video Visit from 10/16/2021 in [place] Video Visit from 08/05/2021 in [place] Video Visit from 05/08/2021 in [place] Video Visit from 03/27/2021 in [place]  Total GAD-7 Score 19 14 4 4 19       PHQ2-9    Flowsheet Row Video Visit from 06/22/2022 in [place] Video Visit from 02/26/2022 in [place] Video Visit from 11/24/2021 in [place] Video Visit from 10/16/2021 in [place] Video Visit from 09/15/2021 in [place]  PHQ-2 Total Score 6 0 3 4 1   PHQ-9 Total Score 21 -- 7 19 8       Flowsheet Row Video Visit from 06/22/2022 in [facility] [place] Video Visit from 04/28/2022 in [place] Video Visit from 02/26/2022 in [facility] [place] No Risk No Risk No Risk        Assessment and Plan: AMEE GLADSTONE is a 32 year old Caucasian female, married, lives in [place], has a history of MDD, GAD, was evaluated by telemedicine today.  Patient is currently struggling with anxiety, depression, will benefit from the following plan.  Plan MDD-unstable Will consider increasing the dosage of Cymbalta or changing to another antidepressant. However will  get hepatic function panel repeated. Continue Cymbalta 30 mg p.o. twice daily for now.  GAD-unstable Patient to continue CBT.  Patient to sign an ROI to coordinate care Continue Cymbalta 30 mg p.o. twice daily for now.  Will consider increasing dosage or changing to another SSRI/SNRI. Patient is currently on propranolol however scheduled dose of 60 mg daily.  Prescribed for migraine headaches.  High risk medication use-will order hepatic function panel.  Patient to go to [facility] lab.  Cymbalta does have an effect on her hepatic function.  Provided education.  Follow-up in  clinic in 3 to 4 weeks or sooner in person.   Collaboration of Care: Collaboration of Care: Referral or follow-up with counselor/therapist AEB patient encouraged to continue psychotherapy, encouraged to sign an ROI to coordinate care.  Patient/Guardian was advised Release of Information must be obtained prior to any record release in order to collaborate their care with an outside provider. Patient/Guardian was advised if they have not already done so to contact the registration department to sign all necessary forms in order for [place] to release information regarding their care.   Consent: Patient/Guardian gives verbal consent for treatment and assignment of benefits for services provided during this visit. Patient/Guardian expressed understanding and agreed to proceed.   This note was generated in part or whole with voice recognition software. Voice recognition is usually quite accurate but there are transcription errors that can and very often do occur. I apologize for any typographical errors that were not detected and corrected.    Ursula Alert, MD 06/22/2022, 2:40 PM

## 2022-07-20 ENCOUNTER — Ambulatory Visit: Payer: Medicaid Other | Admitting: Psychiatry

## 2022-08-20 ENCOUNTER — Other Ambulatory Visit: Payer: Self-pay | Admitting: Psychiatry

## 2022-08-20 DIAGNOSIS — F411 Generalized anxiety disorder: Secondary | ICD-10-CM

## 2022-08-20 DIAGNOSIS — F3341 Major depressive disorder, recurrent, in partial remission: Secondary | ICD-10-CM

## 2022-12-02 ENCOUNTER — Ambulatory Visit (INDEPENDENT_AMBULATORY_CARE_PROVIDER_SITE_OTHER): Payer: Medicaid Other | Admitting: Psychiatry

## 2022-12-02 ENCOUNTER — Telehealth: Payer: Self-pay

## 2022-12-02 ENCOUNTER — Encounter: Payer: Self-pay | Admitting: Psychiatry

## 2022-12-02 VITALS — BP 114/79 | HR 102 | Temp 97.6°F | Ht 62.0 in | Wt 204.0 lb

## 2022-12-02 DIAGNOSIS — F411 Generalized anxiety disorder: Secondary | ICD-10-CM

## 2022-12-02 DIAGNOSIS — R7989 Other specified abnormal findings of blood chemistry: Secondary | ICD-10-CM | POA: Diagnosis not present

## 2022-12-02 DIAGNOSIS — F3181 Bipolar II disorder: Secondary | ICD-10-CM | POA: Diagnosis not present

## 2022-12-02 NOTE — Patient Instructions (Signed)
 Lamotrigine Tablets What is this medication? LAMOTRIGINE (la MOE Patrecia Pace) prevents and controls seizures in people with epilepsy. It may also be used to treat bipolar disorder. It works by calming overactive nerves in your body. This medicine may be used for other purposes; ask your health care provider or pharmacist if you have questions. COMMON BRAND NAME(S): Lamictal, Subvenite What should I tell my care team before I take this medication? They need to know if you have any of these conditions: Heart disease History of irregular heartbeat Immune system problems Kidney disease Liver disease Low levels of folic acid in the blood Lupus Mental health condition Suicidal thoughts, plans, or attempt by you or a family member An unusual or allergic reaction to lamotrigine, other medications, foods, dyes, or preservatives Pregnant or trying to get pregnant Breastfeeding How should I use this medication? Take this medication by mouth with a glass of water. Follow the directions on the prescription label. Do not chew these tablets. If this medication upsets your stomach, take it with food or milk. Take your doses at regular intervals. Do not take your medication more often than directed. A special MedGuide will be given to you by the pharmacist with each new prescription and refill. Be sure to read this information carefully each time. Talk to your care team about the use of this medication in children. While this medication may be prescribed for children as young as 2 years for selected conditions, precautions do apply. Overdosage: If you think you have taken too much of this medicine contact a poison control center or emergency room at once. NOTE: This medicine is only for you. Do not share this medicine with others. What if I miss a dose? If you miss a dose, take it as soon as you can. If it is almost time for your next dose, take only that dose. Do not take double or extra doses. What may  interact with this medication? Atazanavir Certain medications for irregular heartbeat Certain medications for seizures, such as carbamazepine, phenobarbital, phenytoin, primidone, or valproic acid Estrogen or progestin hormones Lopinavir Rifampin Ritonavir This list may not describe all possible interactions. Give your health care provider a list of all the medicines, herbs, non-prescription drugs, or dietary supplements you use. Also tell them if you smoke, drink alcohol, or use illegal drugs. Some items may interact with your medicine. What should I watch for while using this medication? Visit your care team for regular checks on your progress. If you take this medication for seizures, wear a Medic Alert bracelet or necklace. Carry an identification card with information about your condition, medications, and care team. It is important to take this medication exactly as directed. When first starting treatment, your dose will need to be adjusted slowly. It may take weeks or months before your dose is stable. You should contact your care team if your seizures get worse or if you have any new types of seizures. Do not stop taking this medication unless instructed by your care team. Stopping your medication suddenly can increase your seizures or their severity. This medication may cause serious skin reactions. They can happen weeks to months after starting the medication. Contact your care team right away if you notice fevers or flu-like symptoms with a rash. The rash may be red or purple and then turn into blisters or peeling of the skin. You may also notice a red rash with swelling of the face, lips, or lymph nodes in your neck or under your  arms. This medication may affect your coordination, reaction time, or judgment. Do not drive or operate machinery until you know how this medication affects you. Sit up or stand slowly to reduce the risk of dizzy or fainting spells. Drinking alcohol with this  medication can increase the risk of these side effects. If you are taking this medication for bipolar disorder, it is important to report any changes in your mood to your care team. If your condition gets worse, you get mentally depressed, feel very hyperactive or manic, have difficulty sleeping, or have thoughts of hurting yourself or committing suicide, you need to get help from your care team right away. If you are a caregiver for someone taking this medication for bipolar disorder, you should also report these behavioral changes right away. The use of this medication may increase the chance of suicidal thoughts or actions. Pay special attention to how you are responding while on this medication. Your mouth may get dry. Chewing sugarless gum or sucking hard candy and drinking plenty of water may help. Contact your care team if the problem does not go away or is severe. If you become pregnant while using this medication, you may enroll in the [place] American Antiepileptic Drug Pregnancy Registry by calling [phone]. This registry collects information about the safety of antiepileptic medication use during pregnancy. This medication may cause a decrease in folic acid. You should make sure that you get enough folic acid while you are taking this medication. Discuss the foods you eat and the vitamins you take with your care team. What side effects may I notice from receiving this medication? Side effects that you should report to your care team as soon as possible: Allergic reactions--skin rash, itching, hives, swelling of the face, lips, tongue, or throat Change in vision Fever, neck pain or stiffness, sensitivity to light, headache, nausea, vomiting, confusion Heart rhythm changes--fast or irregular heartbeat, dizziness, feeling faint or lightheaded, chest pain, trouble breathing Infection--fever, chills, cough, or sore throat Liver injury--right upper belly pain, loss of appetite, nausea,  light-colored stool, dark yellow or brown urine, yellowing skin or eyes, unusual weakness or fatigue Low red blood cell count--unusual weakness or fatigue, dizziness, headache, trouble breathing Rash, fever, and swollen lymph nodes Redness, blistering, peeling or loosening of the skin, including inside the mouth Thoughts of suicide or self-harm, worsening mood, or feelings of depression Unusual bruising or bleeding Side effects that usually do not require medical attention (report to your care team if they continue or are bothersome): Diarrhea Dizziness Drowsiness Headache Nausea Stomach pain Tremors or shaking This list may not describe all possible side effects. Call your doctor for medical advice about side effects. You may report side effects to FDA at 1-800-FDA-1088. Where should I keep my medication? Keep out of the reach of children and pets. Store at [employer] C (77 degrees F). Protect from light. Get rid of any unused medication after the expiration date. To get rid of medications that are no longer needed or have expired: Take the medication to a medication take-back program. Check with your pharmacy or law enforcement to find a location. If you cannot return the medication, check the label or package insert to see if the medication should be thrown out in the garbage or flushed down the toilet. If you are not sure, ask your care team. If it is safe to put it in the trash, empty the medication out of the container. Mix the medication with cat litter, dirt, coffee grounds,  or other unwanted substance. Seal the mixture in a bag or container. Put it in the trash. NOTE: This sheet is a summary. It may not cover all possible information. If you have questions about this medicine, talk to your doctor, pharmacist, or health care provider.  2024 Elsevier/Gold Standard (2021-09-23 00:00:00)

## 2022-12-02 NOTE — Telephone Encounter (Signed)
Received a medical records request for patient records from 09/28/2018 to present from [employer]. Patient signed medical released form so printed and medical records and fax [phone]

## 2022-12-02 NOTE — Progress Notes (Unsigned)
BH MD OP Progress Note  12/02/2022 4:55 PM Stephanie Ayala  MRN:  [id number]  Chief Complaint:  Chief Complaint  Patient presents with   Follow-up   Anxiety   Depression   Medication Refill   HPI: Stephanie Ayala is a 32 year old Caucasian female, married, lives in [place], has a history of MDD, GAD, insomnia, ulcerative colitis, hypothyroidism, currently postpartum 11 months was evaluated in office today.  Patient today reports she had an episode of irritability, anxiety, decreased need for sleep, racing thoughts, sadness, low mood, excessive spending recently.  She reports it lasted for a month and a half.  She reports at that time she spent around $11,000 on 'crystal skulls'.  Patient reports she does not know why she did that and currently regrets it.  She reports she was able to have a talk with her spouse about what happened and he has agreed to manage the money for now.  She also was able to discuss her symptoms with her therapist- Ms.Jenny Bynum, who felt she may have bipolar disorder.  Patient reports she continues to have episodes of anxiety, depression symptoms, mood swings although it is getting better.  Patient is currently sleeping better since the baby is sleeping through the night.  Patient has been noncompliant with labs-LFT as ordered last visit.  Agrees to get it done.  Patient is compliant on the Cymbalta.    Patient denies any suicidality at this time, does report passive thoughts of 'what is the point' on and off.  Patient currently denies any suicidality, homicidality or perceptual disturbances.  Patient denies any other concerns today.  Visit Diagnosis:    ICD-10-CM   1. Bipolar II disorder, most recent episode hypomanic (HCC)  F31.81 Hepatic function panel   Mixed , moderate    2. GAD (generalized anxiety disorder)  F41.1 Hepatic function panel    3. Elevated LFTs  R79.89 Hepatic function panel      Past Psychiatric History: I have reviewed past psychiatric  history from progress note on 12/21/2019.  Past Medical History:  Past Medical History:  Diagnosis Date   Arthritis    Autoimmune disease (HCC)    Colitis    Depression    Fibromyalgia     Past Surgical History:  Procedure Laterality Date   COLONOSCOPY WITH PROPOFOL N/A 11/26/2020   Procedure: COLONOSCOPY WITH PROPOFOL;  Surgeon: Toney Reil, MD;  Location: [facility] ENDOSCOPY;  Service: Gastroenterology;  Laterality: N/A;   OVARY SURGERY Bilateral    cyst removal    Family Psychiatric History: I have reviewed family psychiatric history from progress note on 12/21/2019.  Family History:  Family History  Problem Relation Age of Onset   Anxiety disorder Mother    Diabetes Father    Hypertension Father    Diabetes Paternal Uncle    Heart disease Paternal Uncle    Alcohol abuse Maternal Grandfather    Heart disease Paternal Grandfather     Social History: I have reviewed social history from progress note on 12/21/2019. Social History   Socioeconomic History   Marital status: Married    Spouse name:  Dylan   Number of children: Not on file   Years of education: Not on file   Highest education level: Not on file  Occupational History   Occupation: Not employed  Tobacco Use   Smoking status: Former    Types: Cigarettes   Smokeless tobacco: Never  Vaping Use   Vaping status: Some Days  Substance and Sexual  Activity   Alcohol use: Not Currently   Drug use: No   Sexual activity: Yes    Birth control/protection: I.U.D.  Other Topics Concern   Not on file  Social History Narrative   Lives in [place]; with daughter/room-mate. No smoking; no alcohol. On retirement disability.    Social Determinants of Health   Financial Resource Strain: Medium Risk (04/28/2022)   Received from [facility], [facility]   Overall Financial Resource Strain (CARDIA)    Difficulty of Paying Living Expenses: Somewhat hard  Food Insecurity: Food Insecurity  Present (04/28/2022)   Received from [facility], [facility]   Hunger Vital Sign    Worried About Running Out of Food in the Last Year: Sometimes true    Ran Out of Food in the Last Year: Never true  Transportation Needs: No Transportation Needs (04/28/2022)   Received from [facility], [facility]   [facility] - Transportation    In the past 12 months, has lack of transportation kept you from medical appointments or from getting medications?: No    Lack of Transportation (Non-Medical): No  Physical Activity: Not on file  Stress: Not on file  Social Connections: Not on file    Allergies: No Known Allergies  Metabolic Disorder Labs: No results found for: "HGBA1C", "MPG" No results found for: "PROLACTIN" Lab Results  Component Value Date   CHOL 204 (H) 02/12/2017   TRIG 327 (H) 11/13/2019   HDL 53 02/12/2017   CHOLHDL 3.8 02/12/2017   LDLCALC 138 (H) 02/12/2017   Lab Results  Component Value Date   TSH 3.040 02/12/2017    Therapeutic Level Labs: No results found for: "LITHIUM" No results found for: "VALPROATE" No results found for: "CBMZ"  Current Medications: Current Outpatient Medications  Medication Sig Dispense Refill   ascorbic acid (VITAMIN C) 500 MG tablet Take by mouth.     Cholecalciferol (VITAMIN D-1000 MAX ST) 25 MCG (1000 UT) tablet Take by mouth.     DULoxetine (CYMBALTA) 30 MG capsule TAKE 1 CAPSULE BY MOUTH 2 TIMES DAILY. 180 capsule 0   ferrous sulfate 325 (65 FE) MG EC tablet Take by mouth.     Magnesium 250 MG TABS Take 500 mg by mouth.     SUMAtriptan (IMITREX) 100 MG tablet PLEASE SEE ATTACHED FOR DETAILED DIRECTIONS     levothyroxine (SYNTHROID) 50 MCG tablet Take 1 tablet by mouth daily.     mesalamine (APRISO) 0.375 g 24 hr capsule Take 4 capsules (1.5 g total) by mouth in the morning and at bedtime. (Patient not taking: Reported on 12/02/2022) 240 capsule 2   propranolol (INDERAL)  10 MG tablet Take 1 tablet (10 mg total) by mouth daily as needed. For anxiety symptoms (Patient taking differently: Take 60 mg by mouth daily. For anxiety symptoms) 90 tablet 0   No current facility-administered medications for this visit.     Musculoskeletal: Strength & Muscle Tone: within normal limits Gait & Station: normal Patient leans: N/A  Psychiatric Specialty Exam: Review of Systems  Psychiatric/Behavioral:  Positive for sleep disturbance.        Mood swings , anxiety    Blood pressure 114/79, pulse (!) 102, temperature 97.6 F (36.4 C), temperature source Skin, height 5\' 2"  (1.575 m), weight 204 lb (92.5 kg), currently breastfeeding.Body mass index is 37.31 kg/m.  General Appearance: Fairly Groomed  Eye Contact:  Fair  Speech:  Clear and Coherent  Volume:  Normal  Mood:   mood swings , irritability, racing thoughts  Affect:  Congruent  Thought Process:  Goal Directed and Descriptions of Associations: Intact  Orientation:  Full (Time, Place, and Person)  Thought Content: Logical   Suicidal Thoughts:  No  Homicidal Thoughts:  No  Memory:  Immediate;   Fair Recent;   Fair Remote;   Fair  Judgement:  Fair  Insight:  Fair  Psychomotor Activity:  Normal  Concentration:  Concentration: Fair and Attention Span: Fair  Recall:  [employer] of Knowledge: Fair  Language: Fair  Akathisia:  No  Handed:  Right  AIMS (if indicated): not done  Assets:  Communication Skills Desire for Improvement Housing Social Support  ADL's:  Intact  Cognition: WNL  Sleep:   restless   Screenings: AIMS    Flowsheet Row Video Visit from 08/05/2021 in [facility]  AIMS Total Score 0      GAD-7    Flowsheet Row Office Visit from 12/02/2022 in [facility] Video Visit from 02/26/2022 in [facility] Video Visit from 10/16/2021 in [facility] Video Visit from 08/05/2021 in [facility] Video Visit from 05/08/2021 in [facility]  Total GAD-7 Score 17 19 14 4 4       PHQ2-9    Flowsheet Row Office Visit from 12/02/2022 in [facility] Video Visit from 06/22/2022 in [facility] Video Visit from 02/26/2022 in [facility] Video Visit from 11/24/2021 in [facility] Visit from 10/16/2021 in [facility]  PHQ-2 Total Score 4 6 0 3 4  PHQ-9 Total Score 19 21 -- 7 19      Flowsheet Row Office Visit from 12/02/2022 in [facility] Video Visit from 06/22/2022 in [facility] Video Visit from 04/28/2022 in [facility]  C-SSRS RISK CATEGORY Low Risk No Risk No Risk        Assessment and Plan: Stephanie Ayala is a 70 year old Caucasian female, married, lives in [place], has a history of MDD, GAD, was evaluated in office today.  Patient with recent hypomanic symptoms as noted above meets criteria for bipolar disorder type II, will benefit from medication readjustment, addition of a mood stabilizer however will benefit from repeat LFTs since she does have a history of abnormal LFTs, patient noncompliant with labs, discussed plan as noted below.  Plan Bipolar disorder type II current episode hypomanic, mixed, moderate-unstable Will consider starting low dosage of a mood stabilizer like Lamictal, discussed medication side effects including risk of Stevens-Johnson syndrome, cardiac effect. Will consider tapering of Cymbalta in the future however will continue Cymbalta 30 mg twice daily for now.   GAD-unstable Cymbalta 30 mg p.o. twice  daily. Patient to continue follow-up with therapist-Ms. Milana Kidney.  Patient to sign an ROI to coordinate care and request medical records.  Elevated LFTs-patient provided lab slip-to repeat hepatic function panel.  Patient's medications like Cymbalta also has an effect on LFT.  Will consider medication changes and addition of Lamictal after reviewing the LFTs.  Follow-up in clinic in 2 to 3 weeks or sooner if needed.   Collaboration of Care: Collaboration of Care: Referral or follow-up with counselor/therapist AEB patient encouraged to continue  CBT.  Patient/Guardian was advised Release of Information must be obtained prior to any record release in order to collaborate their care with an outside provider. Patient/Guardian was advised if they have not already done so to contact the registration department to sign all necessary forms in order for [place] to release information regarding their care.   Consent: Patient/Guardian gives verbal consent for treatment and assignment of benefits for services provided during this visit. Patient/Guardian expressed understanding and agreed to proceed.   This note was generated in part or whole with voice recognition software. Voice recognition is usually quite accurate but there are transcription errors that can and very often do occur. I apologize for any typographical errors that were not detected and corrected.    Jomarie Longs, MD 12/03/2022, 8:17 AM

## 2022-12-19 LAB — HEPATIC FUNCTION PANEL
ALT: 49 IU/L — ABNORMAL HIGH (ref 0–32)
AST: 38 IU/L (ref 0–40)
Albumin: 4.8 g/dL (ref 3.9–4.9)
Alkaline Phosphatase: 116 IU/L (ref 44–121)
Bilirubin Total: 0.9 mg/dL (ref 0.0–1.2)
Bilirubin, Direct: 0.13 mg/dL (ref 0.00–0.40)
Total Protein: 8 g/dL (ref 6.0–8.5)

## 2022-12-23 ENCOUNTER — Other Ambulatory Visit: Payer: Self-pay | Admitting: Psychiatry

## 2022-12-23 DIAGNOSIS — F3341 Major depressive disorder, recurrent, in partial remission: Secondary | ICD-10-CM

## 2022-12-23 DIAGNOSIS — F411 Generalized anxiety disorder: Secondary | ICD-10-CM

## 2022-12-28 ENCOUNTER — Encounter: Payer: Self-pay | Admitting: Psychiatry

## 2022-12-28 ENCOUNTER — Telehealth (INDEPENDENT_AMBULATORY_CARE_PROVIDER_SITE_OTHER): Payer: Medicaid Other | Admitting: Psychiatry

## 2022-12-28 DIAGNOSIS — F411 Generalized anxiety disorder: Secondary | ICD-10-CM | POA: Diagnosis not present

## 2022-12-28 DIAGNOSIS — F3181 Bipolar II disorder: Secondary | ICD-10-CM | POA: Diagnosis not present

## 2022-12-28 MED ORDER — LAMOTRIGINE 25 MG PO TABS: ORAL_TABLET | ORAL | 0 refills | Status: DC

## 2022-12-28 NOTE — Patient Instructions (Signed)
Lamotrigine Tablets What is this medication? LAMOTRIGINE (la MOE Patrecia Pace) prevents and controls seizures in people with epilepsy. It may also be used to treat bipolar disorder. It works by calming overactive nerves in your body. This medicine may be used for other purposes; ask your health care provider or pharmacist if you have questions. COMMON BRAND NAME(S): Lamictal, Subvenite What should I tell my care team before I take this medication? They need to know if you have any of these conditions: Heart disease History of irregular heartbeat Immune system problems Kidney disease Liver disease Low levels of folic acid in the blood Lupus Mental health condition Suicidal thoughts, plans, or attempt by you or a family member An unusual or allergic reaction to lamotrigine, other medications, foods, dyes, or preservatives Pregnant or trying to get pregnant Breastfeeding How should I use this medication? Take this medication by mouth with a glass of water. Follow the directions on the prescription label. Do not chew these tablets. If this medication upsets your stomach, take it with food or milk. Take your doses at regular intervals. Do not take your medication more often than directed. A special MedGuide will be given to you by the pharmacist with each new prescription and refill. Be sure to read this information carefully each time. Talk to your care team about the use of this medication in children. While this medication may be prescribed for children as young as 2 years for selected conditions, precautions do apply. Overdosage: If you think you have taken too much of this medicine contact a poison control center or emergency room at once. NOTE: This medicine is only for you. Do not share this medicine with others. What if I miss a dose? If you miss a dose, take it as soon as you can. If it is almost time for your next dose, take only that dose. Do not take double or extra doses. What may  interact with this medication? Atazanavir Certain medications for irregular heartbeat Certain medications for seizures, such as carbamazepine, phenobarbital, phenytoin, primidone, or valproic acid Estrogen or progestin hormones Lopinavir Rifampin Ritonavir This list may not describe all possible interactions. Give your health care provider a list of all the medicines, herbs, non-prescription drugs, or dietary supplements you use. Also tell them if you smoke, drink alcohol, or use illegal drugs. Some items may interact with your medicine. What should I watch for while using this medication? Visit your care team for regular checks on your progress. If you take this medication for seizures, wear a Medic Alert bracelet or necklace. Carry an identification card with information about your condition, medications, and care team. It is important to take this medication exactly as directed. When first starting treatment, your dose will need to be adjusted slowly. It may take weeks or months before your dose is stable. You should contact your care team if your seizures get worse or if you have any new types of seizures. Do not stop taking this medication unless instructed by your care team. Stopping your medication suddenly can increase your seizures or their severity. This medication may cause serious skin reactions. They can happen weeks to months after starting the medication. Contact your care team right away if you notice fevers or flu-like symptoms with a rash. The rash may be red or purple and then turn into blisters or peeling of the skin. You may also notice a red rash with swelling of the face, lips, or lymph nodes in your neck or under your  arms. This medication may affect your coordination, reaction time, or judgment. Do not drive or operate machinery until you know how this medication affects you. Sit up or stand slowly to reduce the risk of dizzy or fainting spells. Drinking alcohol with this  medication can increase the risk of these side effects. If you are taking this medication for bipolar disorder, it is important to report any changes in your mood to your care team. If your condition gets worse, you get mentally depressed, feel very hyperactive or manic, have difficulty sleeping, or have thoughts of hurting yourself or committing suicide, you need to get help from your care team right away. If you are a caregiver for someone taking this medication for bipolar disorder, you should also report these behavioral changes right away. The use of this medication may increase the chance of suicidal thoughts or actions. Pay special attention to how you are responding while on this medication. Your mouth may get dry. Chewing sugarless gum or sucking hard candy and drinking plenty of water may help. Contact your care team if the problem does not go away or is severe. If you become pregnant while using this medication, you may enroll in the [place] American Antiepileptic Drug Pregnancy Registry by calling [phone]. This registry collects information about the safety of antiepileptic medication use during pregnancy. This medication may cause a decrease in folic acid. You should make sure that you get enough folic acid while you are taking this medication. Discuss the foods you eat and the vitamins you take with your care team. What side effects may I notice from receiving this medication? Side effects that you should report to your care team as soon as possible: Allergic reactions--skin rash, itching, hives, swelling of the face, lips, tongue, or throat Change in vision Fever, neck pain or stiffness, sensitivity to light, headache, nausea, vomiting, confusion Heart rhythm changes--fast or irregular heartbeat, dizziness, feeling faint or lightheaded, chest pain, trouble breathing Infection--fever, chills, cough, or sore throat Liver injury--right upper belly pain, loss of appetite, nausea,  light-colored stool, dark yellow or brown urine, yellowing skin or eyes, unusual weakness or fatigue Low red blood cell count--unusual weakness or fatigue, dizziness, headache, trouble breathing Rash, fever, and swollen lymph nodes Redness, blistering, peeling or loosening of the skin, including inside the mouth Thoughts of suicide or self-harm, worsening mood, or feelings of depression Unusual bruising or bleeding Side effects that usually do not require medical attention (report to your care team if they continue or are bothersome): Diarrhea Dizziness Drowsiness Headache Nausea Stomach pain Tremors or shaking This list may not describe all possible side effects. Call your doctor for medical advice about side effects. You may report side effects to FDA at 1-800-FDA-1088. Where should I keep my medication? Keep out of the reach of children and pets. Store at [employer] C (77 degrees F). Protect from light. Get rid of any unused medication after the expiration date. To get rid of medications that are no longer needed or have expired: Take the medication to a medication take-back program. Check with your pharmacy or law enforcement to find a location. If you cannot return the medication, check the label or package insert to see if the medication should be thrown out in the garbage or flushed down the toilet. If you are not sure, ask your care team. If it is safe to put it in the trash, empty the medication out of the container. Mix the medication with cat litter, dirt, coffee grounds,  or other unwanted substance. Seal the mixture in a bag or container. Put it in the trash. NOTE: This sheet is a summary. It may not cover all possible information. If you have questions about this medicine, talk to your doctor, pharmacist, or health care provider.  2024 Elsevier/Gold Standard (2021-09-23 00:00:00)

## 2022-12-28 NOTE — Progress Notes (Signed)
Virtual Visit via Video Note  I connected with Stephanie Ayala on 12/28/22 at  4:00 PM EDT by a video enabled telemedicine application and verified that I am speaking with the correct person using two identifiers.  Location Provider Location : [facility] Patient Location : Home  Participants: Patient , Provider    I discussed the limitations of evaluation and management by telemedicine and the availability of in person appointments. The patient expressed understanding and agreed to proceed.   I discussed the assessment and treatment plan with the patient. The patient was provided an opportunity to ask questions and all were answered. The patient agreed with the plan and demonstrated an understanding of the instructions.   The patient was advised to call back or seek an in-person evaluation if the symptoms worsen or if the condition fails to improve as anticipated.   BH MD OP Progress Note  12/28/2022 4:26 PM Stephanie Ayala  MRN:  [id number]  Chief Complaint:  Chief Complaint  Patient presents with   Follow-up   Depression   Medication Refill   HPI: Stephanie Ayala is a 31 year old Caucasian female, married, lives in [place], has a history of bipolar disorder type II, GAD, insomnia, ulcerative colitis, hypothyroidism, was evaluated by telemedicine today.  Patient today reports she continues to struggle with episodes of low mood, lack of motivation as well as irritability, anxiety.  Patient with recent diagnosis of bipolar type II, with a recent hypomanic episode.  Patient had labs done including liver function which shows ALT level to be at 49.  Patient is not interested in tapering down the Cymbalta since it helps with her fibromyalgia pain.  Patient agreeable to trial of Lamictal as a mood stabilizer.  Patient reports sleep as restless on and off.  There are nights when she has difficulty falling asleep.  She reports the baby has started sleeping through the night.  She is clearly not sure what  could be contributing to it.  Patient agreeable to work on sleep hygiene.  Patient with history of migraine headaches, reports she was prescribed propranolol to use daily however that caused her to be more depressed and fatigued.  She reports headaches are not so bad right now.  She stopped taking the propranolol.  She reports she and her 57-year-old both have GI issues , has diarrhea, started a day ago.  Agrees to reach out to primary care provider as needed.  Patient denies any suicidality, homicidality or perceptual disturbances.  Patient is currently in psychotherapy with Ms. Milana Kidney, follows up with her therapist every other week.  Reports therapy sessions are beneficial.  Patient denies any other concerns today.  Visit Diagnosis:    ICD-10-CM   1. Bipolar II disorder, most recent episode hypomanic (HCC)  F31.81 lamoTRIgine (LAMICTAL) 25 MG tablet   Mixed , moderate    2. GAD (generalized anxiety disorder)  F41.1       Past Psychiatric History: I have reviewed past psychiatric history from progress note on 12/21/2019.  Past Medical History:  Past Medical History:  Diagnosis Date   Arthritis    Autoimmune disease (HCC)    Colitis    Depression    Fibromyalgia     Past Surgical History:  Procedure Laterality Date   COLONOSCOPY WITH PROPOFOL N/A 11/26/2020   Procedure: COLONOSCOPY WITH PROPOFOL;  Surgeon: Toney Reil, MD;  Location: [facility] ENDOSCOPY;  Service: Gastroenterology;  Laterality: N/A;   OVARY SURGERY Bilateral    cyst removal  Family Psychiatric History: I have reviewed family psychiatric history from progress note on 12/21/2019.  Family History:  Family History  Problem Relation Age of Onset   Anxiety disorder Mother    Diabetes Father    Hypertension Father    Diabetes Paternal Uncle    Heart disease Paternal Uncle    Alcohol abuse Maternal Grandfather    Heart disease Paternal Grandfather     Social History: I have reviewed social history  from progress note on 12/21/2019. Social History   Socioeconomic History   Marital status: Married    Spouse name:  Dylan   Number of children: Not on file   Years of education: Not on file   Highest education level: Not on file  Occupational History   Occupation: Not employed  Tobacco Use   Smoking status: Former    Types: Cigarettes   Smokeless tobacco: Never  Vaping Use   Vaping status: Some Days  Substance and Sexual Activity   Alcohol use: Not Currently   Drug use: No   Sexual activity: Yes    Birth control/protection: I.U.D.  Other Topics Concern   Not on file  Social History Narrative   Lives in [place]; with daughter/room-mate. No smoking; no alcohol. On retirement disability.    Social Determinants of Health   Financial Resource Strain: Medium Risk (04/28/2022)   Received from [facility], [facility]   Overall Financial Resource Strain (CARDIA)    Difficulty of Paying Living Expenses: Somewhat hard  Food Insecurity: Food Insecurity Present (04/28/2022)   Received from [facility], [facility]   Hunger Vital Sign    Worried About Running Out of Food in the Last Year: Sometimes true    Ran Out of Food in the Last Year: Never true  Transportation Needs: No Transportation Needs (04/28/2022)   Received from [facility], [facility]   [facility] - Transportation    In the past 12 months, has lack of transportation kept you from medical appointments or from getting medications?: No    Lack of Transportation (Non-Medical): No  Physical Activity: Not on file  Stress: Not on file  Social Connections: Not on file    Allergies: No Known Allergies  Metabolic Disorder Labs: No results found for: "HGBA1C", "MPG" No results found for: "PROLACTIN" Lab Results  Component Value Date   CHOL 204 (H) 02/12/2017   TRIG 327 (H) 11/13/2019   HDL 53 02/12/2017   CHOLHDL 3.8  02/12/2017   LDLCALC 138 (H) 02/12/2017   Lab Results  Component Value Date   TSH 3.040 02/12/2017    Therapeutic Level Labs: No results found for: "LITHIUM" No results found for: "VALPROATE" No results found for: "CBMZ"  Current Medications: Current Outpatient Medications  Medication Sig Dispense Refill   lamoTRIgine (LAMICTAL) 25 MG tablet Take 1 tablet (25 mg total) by mouth daily for 15 days, THEN 1 tablet (25 mg total) 2 (two) times daily for 15 days. 45 tablet 0   ascorbic acid (VITAMIN C) 500 MG tablet Take by mouth.     Cholecalciferol (VITAMIN D-1000 MAX ST) 25 MCG (1000 UT) tablet Take by mouth.     DULoxetine (CYMBALTA) 30 MG capsule TAKE 1 CAPSULE BY MOUTH TWICE A DAY 180 capsule 0   ferrous sulfate 325 (65 FE) MG EC tablet Take by mouth.     levothyroxine (SYNTHROID) 50 MCG tablet Take 1 tablet by mouth daily.  Magnesium 250 MG TABS Take 500 mg by mouth.     mesalamine (APRISO) 0.375 g 24 hr capsule Take 4 capsules (1.5 g total) by mouth in the morning and at bedtime. (Patient not taking: Reported on 12/02/2022) 240 capsule 2   propranolol (INDERAL) 10 MG tablet Take 1 tablet (10 mg total) by mouth daily as needed. For anxiety symptoms (Patient not taking: Reported on 12/28/2022) 90 tablet 0   SUMAtriptan (IMITREX) 100 MG tablet PLEASE SEE ATTACHED FOR DETAILED DIRECTIONS     No current facility-administered medications for this visit.     Musculoskeletal: Strength & Muscle Tone:  UTA Gait & Station:  Seated Patient leans: N/A  Psychiatric Specialty Exam: Review of Systems  Gastrointestinal:  Positive for diarrhea.  Psychiatric/Behavioral:  Positive for sleep disturbance.        Mood swings    currently breastfeeding.There is no height or weight on file to calculate BMI.  General Appearance: Fairly Groomed  Eye Contact:  Fair  Speech:  Clear and Coherent  Volume:  Normal  Mood:  Depressed and Mood swings  Affect:  Appropriate  Thought Process:  Goal  Directed and Descriptions of Associations: Intact  Orientation:  Full (Time, Place, and Person)  Thought Content: Logical   Suicidal Thoughts:  No  Homicidal Thoughts:  No  Memory:  Immediate;   Fair Recent;   Fair Remote;   Fair  Judgement:  Fair  Insight:  Fair  Psychomotor Activity:  Normal  Concentration:  Concentration: Fair and Attention Span: Fair  Recall:  [employer] of Knowledge: Fair  Language: Fair  Akathisia:  No  Handed:  Right  AIMS (if indicated): not done  Assets:  Communication Skills Desire for Improvement Housing Social Support  ADL's:  Intact  Cognition: WNL  Sleep:   improving   Screenings: AIMS    Flowsheet Row Video Visit from 08/05/2021 in [facility]  AIMS Total Score 0      GAD-7    Flowsheet Row Office Visit from 12/02/2022 in [facility] Video Visit from 02/26/2022 in [facility] Video Visit from 10/16/2021 in [facility] Video Visit from 08/05/2021 in [facility] Video Visit from 05/08/2021 in [facility]  Total GAD-7 Score 17 19 14 4 4       PHQ2-9    Flowsheet Row Office Visit from 12/02/2022 in [facility] Video Visit from 06/22/2022 in [facility] Video Visit from 02/26/2022 in [facility] Video Visit from 11/24/2021 in [facility] Video Visit from 10/16/2021 in [facility]  PHQ-2 Total Score 4 6 0 3 4  PHQ-9 Total Score 19 21 -- 7 19      Flowsheet Row Video Visit from 12/28/2022 in [facility] Office Visit from 12/02/2022 in [facility] Video Visit from 06/22/2022 in [facility]  C-SSRS RISK CATEGORY No Risk Low Risk No Risk        Assessment and Plan: KADANCE MCCUISTION is a 32 year old Caucasian female, married, lives in [place], has a history of MDD, GAD was evaluated by telemedicine today.  Patient with recent diagnosis of bipolar disorder type II, will benefit from addition of a mood stabilizer, Lamictal.  Plan as noted below.  Plan Bipolar disorder type II current episode hypomanic, mixed moderate-unstable Start Lamictal 25 mg p.o. daily for 15 days and increase to 25 mg p.o. twice daily after 15 days. Provided medication education, discussed side effects including Stevens-Johnson syndrome. Discussed with patient LFT needs to be monitored while on these medications. Discussed tapering down Cymbalta, patient however declines.  Cymbalta helps with fibromyalgia pain and she would like to stay on it.   GAD-unstable Cymbalta 30 mg p.o. twice daily Add Lamictal as noted above. Patient to continue psychotherapy with Ms. Milana Kidney.  I have reviewed labs-CMP-LFT-ALT elevated at 49, AST within normal limits-38 otherwise within normal limits.  Will repeat and monitor closely. Collaboration of Care: Collaboration of Care: Referral or follow-up with counselor/therapist AEB patient encouraged to continue CBT  Patient/Guardian was advised Release of Information must be obtained prior to any record release in order to collaborate their care with an outside provider. Patient/Guardian was advised if they have not already done so to contact the registration department to sign all necessary forms in order for [place] to release information regarding their care.   Consent: Patient/Guardian gives verbal consent for treatment and assignment of benefits for services provided during this visit. Patient/Guardian expressed understanding and agreed to proceed.   Follow-up in clinic in 4 weeks or  sooner if needed.  This note was generated in part or whole with voice recognition software. Voice recognition is usually quite accurate but there are transcription errors that can and very often do occur. I apologize for any typographical errors that were not detected and corrected.    Jomarie Longs, MD 12/28/2022, 4:26 PM

## 2023-01-15 ENCOUNTER — Other Ambulatory Visit: Payer: Self-pay | Admitting: Gastroenterology

## 2023-01-15 DIAGNOSIS — K51011 Ulcerative (chronic) pancolitis with rectal bleeding: Secondary | ICD-10-CM

## 2023-01-21 ENCOUNTER — Telehealth: Payer: Medicaid Other | Admitting: Psychiatry

## 2023-01-25 ENCOUNTER — Telehealth: Payer: Self-pay | Admitting: Gastroenterology

## 2023-01-25 DIAGNOSIS — K51011 Ulcerative (chronic) pancolitis with rectal bleeding: Secondary | ICD-10-CM

## 2023-01-25 NOTE — Telephone Encounter (Signed)
The patient called in to schedule an appointment. I sent out an appointment reminder with a No-Show letter and a map. I verified and updated the information in her chart, including the insurance guarantor and other relevant details. Patient needs a refill on (Mesalamine) 0.375 g 24 hr and patient is schedule on 03/17/2023 at 2:30 pm.

## 2023-01-25 NOTE — Addendum Note (Signed)
Addended by: Radene Knee L on: 01/25/2023 04:54 PM   Modules accepted: Orders

## 2023-01-25 NOTE — Telephone Encounter (Signed)
Last office visit 02/10/2022 Ulcerative Pancolitis  Last refill 03/05/2022 2 refills  No show appointment on 05/25/2022 Has appointment schedule for 03/17/2023

## 2023-01-26 MED ORDER — MESALAMINE ER 0.375 G PO CP24: 1500.0000 mg | ORAL_CAPSULE | Freq: Two times a day (BID) | ORAL | 1 refills | Status: DC

## 2023-01-26 NOTE — Telephone Encounter (Signed)
Sent medication to the pharmacy  

## 2023-01-26 NOTE — Addendum Note (Signed)
Addended by: Radene Knee L on: 01/26/2023 07:30 AM   Modules accepted: Orders

## 2023-01-28 ENCOUNTER — Telehealth: Payer: Self-pay | Admitting: Psychiatry

## 2023-01-28 NOTE — Telephone Encounter (Signed)
Contacted patient to discuss her concern about her medications, currently not taking the Lamictal since she felt extremely sleepy.  Patient reports overall mood symptoms currently stable.  Denies any depression, mania or hypomanic symptoms.  Patient agreeable to stay off of medications for now and monitor her symptoms since she is also breast-feeding actively.  She will continue psychotherapy in the meantime.  She does have gabapentin that was recently started for her fibromyalgia which is also a good mood stabilizer.  Although she is taking only 100 mg daily for now.  Patient to return to the office as needed.  Will send a message to staff to cancel appointment for tomorrow.

## 2023-01-29 ENCOUNTER — Telehealth: Payer: Medicaid Other | Admitting: Psychiatry

## 2023-01-29 ENCOUNTER — Telehealth: Payer: Self-pay

## 2023-01-29 NOTE — Telephone Encounter (Signed)
Received a PA through cover my meds for Mesalamine ER 0.375GM and tried to do PA through cover my meds. When filled out information it stated the medication was Available without authorization.

## 2023-02-15 ENCOUNTER — Encounter: Payer: Self-pay | Admitting: Gastroenterology

## 2023-02-15 MED ORDER — BUDESONIDE 3 MG PO CPEP: 3.0000 mg | ORAL_CAPSULE | Freq: Every day | ORAL | 0 refills | Status: DC

## 2023-02-17 DIAGNOSIS — F319 Bipolar disorder, unspecified: Secondary | ICD-10-CM | POA: Insufficient documentation

## 2023-03-09 ENCOUNTER — Other Ambulatory Visit: Payer: Self-pay | Admitting: Gastroenterology

## 2023-03-09 DIAGNOSIS — K51011 Ulcerative (chronic) pancolitis with rectal bleeding: Secondary | ICD-10-CM

## 2023-03-17 ENCOUNTER — Encounter: Payer: Self-pay | Admitting: Gastroenterology

## 2023-03-17 ENCOUNTER — Telehealth: Payer: Medicaid Other | Admitting: Gastroenterology

## 2023-03-17 DIAGNOSIS — K5100 Ulcerative (chronic) pancolitis without complications: Secondary | ICD-10-CM | POA: Diagnosis not present

## 2023-03-17 NOTE — Progress Notes (Signed)
Lannette Donath, MD [address]  [address]  [place], [place] [address]  Main: [phone]  Fax: [phone]    Gastroenterology Consultation Video Visit  Referring Provider:     Orene Desanctis, MD Primary Care Physician:  Gardiner Coins, PA-C Primary Gastroenterologist:  Dr. Arlyss Repress Reason for Consultation: Ulcerative pancolitis        HPI:   Stephanie Ayala is a 32 y.o. female referred by Gardiner Coins, PA-C  for consultation & management of ulcerative pancolitis  Virtual Visit Video Note  I connected with Stephanie Ayala on 03/17/23 at  2:30 PM EST by video and verified that I am speaking with the correct person using two identifiers.   I discussed the limitations, risks, security and privacy concerns of performing an evaluation and management service by video and the availability of in person appointments. I also discussed with the patient that there may be a patient responsible charge related to this service. The patient expressed understanding and agreed to proceed.  Location of the Patient: Home  Location of the provider: Office  Persons participating in the visit: Patient and provider only   History of Present Illness: Stephanie Ayala with a history of ulcerative colitis, diagnosed in 2018 with chronic active colitis in cecum as well as in the rectum.  Patient was originally seen by me in 10/2020, underwent repeat colonoscopy which revealed moderate active chronic colitis in the cecum as well as in the rectum and inactive colitis in rest of the colon.  She was temporarily on prednisone, discontinued due to mood swings and irritability.  She was also on balsalazide 750 mg 3 times daily which did not help with symptoms, therefore discontinued it.  I discussed with her regarding initiation of Biologics, however her LFTs were elevated which I felt were secondary to underlying fatty liver, excess Tylenol use.  Patient stopped Tylenol use and also decreased dose  of Cymbalta from 90 mg to 30 mg.  Most recent LFTs from 2/5 came back normal except for mildly elevated total bilirubin.  Patient made an urgent video visit due to worsening of her GI symptoms.  Patient reports that for the past 2 weeks she has been experiencing severe diarrhea, mixed with blood, sometimes associated with clots, worse after eating, up to 6 times daily associated with abdominal cramps.  She also experienced nausea and vomiting, felt dehydrated.  In the ER, she was confirmed to be pregnant based on the ultrasound and serum hCG levels.  Her labs revealed normal CBC, lipase, negative COVID.  Patient is now [redacted] weeks pregnant.  Patient had a visit with Dr. Jerene Pitch, OB/GYN yesterday.  Has a follow-up appointment with CNM on 2/20  Follow-up visit 06/02/2021 Stephanie Ayala has mild to moderate ulcerative colitis, currently maintained on Apriso.  Patient is taking 4 pills at night.  She is not taking 4 pills in the morning.  She reports that her diarrhea has resolved.  Also, her rectal bleeding has significantly improved.  She does notice mucus per rectum occasionally.  She has an appointment at [facility], obstetrics later this month.  She does not have any GI concerns today.  She did not drop of the stool specimen yet, she just switched her insurance and currently active  Follow-up visit 03/17/2023 Stephanie Ayala has ulcerative pancolitis maintained on Apriso 1.5 g daily.  She called our office about a month ago when she ran out of Apriso which has resulted in mild flareup.  I  prescribed 1 month of budesonide 3 mg 3 pills daily and refilled Apriso.  She did not have a follow-up for more than a year with me.  She also did not undergo colonoscopy as recommended by me.  She became pregnant, delivered healthy baby who is 25 months old.  She is currently breast-feeding.  She reports that her symptoms are significantly improved.  Having 1-2 bowel movements daily with mild abdominal discomfort and scant amount of rectal  bleeding.  Her labs earlier this month showed elevated CRP, mild thrombocytosis.  AST and ALT were normal.   NSAIDs: None  Antiplts/Anticoagulants/Anti thrombotics: None  GI Procedures:  Colonoscopy 2018, report not available Pathology revealed chronic active colitis in cecum, chronic active proctitis and rectum   Colonoscopy 11/26/2020 - The examined portion of the ileum was normal. Biopsied. - Localized moderate inflammation was found in the cecum secondary to colitis. Biopsied. - Normal mucosa in the recto-sigmoid colon, in the sigmoid colon, in the descending colon, in the transverse colon and in the ascending colon. Biopsied. - Diffuse moderate inflammation was found in the distal rectum secondary to proctitis ulcerative colitis. Biopsied. - One 5 mm polyp in the descending colon, removed with a cold snare. Resected and retrieved.   DIAGNOSIS:  A. TERMINAL ILEUM, RANDOM; COLD BIOPSY:  - ENTERIC MUCOSA WITH NORMAL VILLOUS ARCHITECTURE AND REACTIVE LYMPHOID  HYPERPLASIA.  - NEGATIVE FOR ACTIVE ILEITIS.  - NEGATIVE FOR GRANULOMA, DYSPLASIA, AND MALIGNANCY.   B. COLON, RANDOM CECUM; COLD BIOPSY:  - CHRONIC COLITIS WITH MILD TO MODERATE ACTIVITY (CRYPTITIS AND FOCAL  CRYPT DISRUPTION).  - NEGATIVE FOR GRANULOMA, DYSPLASIA, AND MALIGNANCY.   C. COLON, RANDOM ASCENDING; COLD BIOPSY:  - CHRONIC COLITIS WITHOUT ACTIVITY.  - NEGATIVE FOR GRANULOMA, DYSPLASIA, AND MALIGNANCY.   D. COLON, RANDOM TRANSVERSE; COLD BIOPSY:  - CHRONIC COLITIS WITHOUT ACTIVITY.  - NEGATIVE FOR GRANULOMA, DYSPLASIA, AND MALIGNANCY.   E. COLON, RANDOM DESCENDING; COLD BIOPSY:  - CHRONIC COLITIS WITHOUT ACTIVITY.  - NEGATIVE FOR GRANULOMA, DYSPLASIA, AND MALIGNANCY.   F. COLON POLYP, DESCENDING; COLD SNARE:  - SESSILE SERRATED POLYP.  - NEGATIVE FOR DYSPLASIA AND MALIGNANCY.   G. COLON, RANDOM SIGMOID; COLD BIOPSY:  - CHRONIC COLITIS WITHOUT ACTIVITY.  - NEGATIVE FOR GRANULOMA, DYSPLASIA, AND  MALIGNANCY.   H. 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  - NEGATIVE FOR GRANULOMA, DYSPLASIA, AND MALIGNANCY.   Past Medical History:  Diagnosis Date   Arthritis    Autoimmune disease (HCC)    Colitis    Depression    Fibromyalgia     Past Surgical History:  Procedure Laterality Date   COLONOSCOPY WITH PROPOFOL N/A 11/26/2020   Procedure: COLONOSCOPY WITH PROPOFOL;  Surgeon: Toney Reil, MD;  Location: [facility];  Service: Gastroenterology;  Laterality: N/A;   OVARY SURGERY Bilateral    cyst removal    Current Outpatient Medications:    ascorbic acid (VITAMIN C) 500 MG tablet, Take by mouth., Disp: , Rfl:    budesonide (ENTOCORT EC) 3 MG 24 hr capsule, Take 1 capsule (3 mg total) by mouth daily., Disp: 90 capsule, Rfl: 0   Cholecalciferol (VITAMIN D-1000 MAX ST) 25 MCG (1000 UT) tablet, Take by mouth., Disp: , Rfl:    ferrous sulfate 325 (65 FE) MG EC tablet, Take by mouth., Disp: , Rfl:    gabapentin (NEURONTIN) 100 MG capsule, Take 1 capsule by mouth daily., Disp: , Rfl:    levothyroxine (SYNTHROID) 50  MCG tablet, Take 1 tablet by mouth daily., Disp: , Rfl:    Magnesium 250 MG TABS, Take 500 mg by mouth., Disp: , Rfl:    mesalamine (APRISO) 0.375 g 24 hr capsule, Take 4 capsules (1.5 g total) by mouth in the morning and at bedtime., Disp: 240 capsule, Rfl: 1   SUMAtriptan (IMITREX) 100 MG tablet, PLEASE SEE ATTACHED FOR DETAILED DIRECTIONS, Disp: , Rfl:     Family History  Problem Relation Age of Onset   Anxiety disorder Mother    Diabetes Father    Hypertension Father    Diabetes Paternal Uncle    Heart disease Paternal Uncle    Alcohol abuse Maternal Grandfather    Heart disease Paternal Grandfather      Social History   Tobacco Use   Smoking status: Former    Types: Cigarettes   Smokeless tobacco: Never  Vaping Use   Vaping status: Some Days  Substance Use Topics   Alcohol  use: Not Currently   Drug use: No    Allergies as of 03/17/2023   (No Known Allergies)    Imaging Studies: Reviewed  Assessment and Plan:   Stephanie Ayala is a 32 y.o. female with history of depression, history of ulcerative pancolitis diagnosed in 2018 is currently maintained on Apriso, recent flareup in 01/2023 when she ran out of refills on mesalamine.  Symptoms responded to budesonide and reinitiation of Apriso  Mild to moderate ulcerative pancolitis: Currently in near complete clinical remission Patient is currently taking Apriso 1.5 g daily Currently on budesonide 3 mg 1 pill a day, complete the course as prescribed Today, I have reiterated to the patient regarding colonoscopy and she says she will think about it  Follow Up Instructions:   I discussed the assessment and treatment plan with the patient. The patient was provided an opportunity to ask questions and all were answered. The patient agreed with the plan and demonstrated an understanding of the instructions.   The patient was advised to call back or seek an in-person evaluation if the symptoms worsen or if the condition fails to improve as anticipated.  I provided 25 minutes of face-to-face time during this encounter.   Follow up in 6 months   Arlyss Repress, MD

## 2023-04-23 ENCOUNTER — Other Ambulatory Visit: Payer: Self-pay | Admitting: Gastroenterology

## 2023-04-23 DIAGNOSIS — K51011 Ulcerative (chronic) pancolitis with rectal bleeding: Secondary | ICD-10-CM

## 2023-06-21 ENCOUNTER — Telehealth: Payer: Self-pay

## 2023-06-21 DIAGNOSIS — F3181 Bipolar II disorder: Secondary | ICD-10-CM

## 2023-06-21 DIAGNOSIS — F411 Generalized anxiety disorder: Secondary | ICD-10-CM

## 2023-06-21 NOTE — Telephone Encounter (Signed)
 received fax from [facility]. requesting a refill on the duloxetine pt was last seen on9-30 next appt 4-11

## 2023-06-21 NOTE — Telephone Encounter (Signed)
 Please clarify with pharmacy duloxetine dosage as well as when her last refill was.

## 2023-06-23 MED ORDER — DULOXETINE HCL 30 MG PO CPEP: 30.0000 mg | ORAL_CAPSULE | Freq: Every day | ORAL | 0 refills | Status: DC

## 2023-06-23 NOTE — Telephone Encounter (Signed)
 I have sent duloxetine to pharmacy.  Patient needs to keep her appointment for any future refills.

## 2023-06-23 NOTE — Telephone Encounter (Signed)
 pharmacy called back states that pt last filled a 90 day supply on 01-10-23 for 30mg 

## 2023-06-23 NOTE — Telephone Encounter (Signed)
 Pt.notified

## 2023-07-09 ENCOUNTER — Telehealth: Admitting: Psychiatry

## 2023-07-09 ENCOUNTER — Encounter: Payer: Self-pay | Admitting: Psychiatry

## 2023-07-09 DIAGNOSIS — F424 Excoriation (skin-picking) disorder: Secondary | ICD-10-CM

## 2023-07-09 DIAGNOSIS — F411 Generalized anxiety disorder: Secondary | ICD-10-CM | POA: Diagnosis not present

## 2023-07-09 DIAGNOSIS — F3181 Bipolar II disorder: Secondary | ICD-10-CM | POA: Insufficient documentation

## 2023-07-09 DIAGNOSIS — F439 Reaction to severe stress, unspecified: Secondary | ICD-10-CM

## 2023-07-09 DIAGNOSIS — Z9189 Other specified personal risk factors, not elsewhere classified: Secondary | ICD-10-CM

## 2023-07-09 MED ORDER — OLANZAPINE 2.5 MG PO TABS: 2.5000 mg | ORAL_TABLET | Freq: Every day | ORAL | 1 refills | Status: DC

## 2023-07-09 MED ORDER — DULOXETINE HCL 30 MG PO CPEP: 30.0000 mg | ORAL_CAPSULE | Freq: Two times a day (BID) | ORAL | 0 refills | Status: DC

## 2023-07-09 NOTE — Patient Instructions (Signed)
 Please call for EKG - [phone]  Olanzapine Tablets What is this medication? OLANZAPINE (oh LAN za peen) treats schizophrenia and bipolar disorder. It works by balancing the levels of dopamine and serotonin in your brain, substances that help regulate mood, behaviors, and thoughts. It belongs to a group of medications called antipsychotics. Antipsychotic medications can be used to treat several kinds of mental health conditions. This medicine may be used for other purposes; ask your health care provider or pharmacist if you have questions. COMMON BRAND NAME(S): Zyprexa What should I tell my care team before I take this medication? They need to know if you have any of these conditions: Bowel blockage Constipation Dementia Diabetes Glaucoma Have trouble controlling your muscles Heart disease High cholesterol High levels of prolactin History of breast cancer History of irregular heartbeat History of stroke Liver disease Low blood cell levels (white cells, red cells, and platelets) Low blood pressure Parkinson disease Prostate disease Seizures Suicidal thoughts, plans, or attempt by you or a family member Tobacco use Trouble passing urine Trouble swallowing An unusual or allergic reaction to olanzapine, other medications, foods, dyes, or preservatives Pregnant or trying to get pregnant Breastfeeding How should I use this medication? Take this medication by mouth. Swallow it with a drink of water. Follow the directions on the prescription label. Take your medication at regular intervals. Do not take it more often than directed. Do not stop taking except on the advice of your care team. A special MedGuide will be given to you by the pharmacist with each new prescription and refill. Be sure to read this information carefully each time. Talk to your care team about the use of this medication in children. While this medication may be prescribed for children as young as 13 years for  selected conditions, precautions do apply. Overdosage: If you think you have taken too much of this medicine contact a poison control center or emergency room at once. NOTE: This medicine is only for you. Do not share this medicine with others. What if I miss a dose? If you miss a dose, take it as soon as you can. If it is almost time for your next dose, take only that dose. Do not take double or extra doses. What may interact with this medication? Do not take this medication with any of the following: Cisapride Dronedarone Ketoconazole Levoketoconazole Metoclopramide Pimozide Thioridazine This medication may also interact with the following: Alcohol Benzodiazepines, such as alprazolam, diazepam, lorazepam Certain antihistamines Certain medications for depression, such as amitriptyline or trazodone Certain medications for seizures, such as phenobarbital or primidone Medications that cause drowsiness before a procedure, such as propofol Medications that help you fall asleep Medications that relax muscles Opioids for pain or cough Other medications that cause heart rhythm changes Phenothiazines, such as chlorpromazine or prochlorperazine This list may not describe all possible interactions. Give your health care provider a list of all the medicines, herbs, non-prescription drugs, or dietary supplements you use. Also tell them if you smoke, drink alcohol, or use illegal drugs. Some items may interact with your medicine. What should I watch for while using this medication? Visit your care team for regular checks on your progress. Tell your care team if your symptoms do not start to get better or if they get worse. Do not suddenly stop taking this medication. You may develop a severe reaction. Your care team will tell you how much medication to take. If your care team wants you to stop the medication, the dose  may be slowly lowered over time to avoid any side effects. This medication may  cause serious skin reactions. They can happen weeks to months after starting the medication. Contact your care team right away if you notice fevers or flu-like symptoms with a rash. The rash may be red or purple and then turn into blisters or peeling of the skin. You may also notice a red rash with swelling of the face, lips, or lymph nodes in your neck or under your arms. This medication may affect your coordination, reaction time, or judgment. Do not drive or operate machinery until you know how this medication affects you. Sit up or stand slowly to reduce the risk of dizzy or fainting spells. Drinking alcohol with this medication can increase the risk of these side effects. Your mouth may get dry. Chewing sugarless gum or sucking hard candy and drinking plenty of water may help. This medication can cause problems with controlling your body temperature. It can lower the response of your body to cold temperatures. If possible, stay indoors during cold weather. If you must go outdoors, wear warm clothes. It can also lower the response of your body to heat. Do not overheat. Do not over-exercise. Stay out of the sun when possible. If you must be in the sun, wear cool clothing. Drink plenty of water. If you have trouble controlling your body temperature, call your care team right away. This medication may increase blood sugar. If you have diabetes, ask your care team if changes in diet or medications are needed. If you smoke, tell your care team if you notice this medication is not working well for you. Talk to your care team if you are a smoker or if you decide to stop smoking. What side effects may I notice from receiving this medication? Side effects that you should report to your care team as soon as possible: Allergic reactions--skin rash, itching, hives, swelling of the face, lips, tongue, or throat High blood sugar (hyperglycemia)--increased thirst or amount of urine, unusual weakness or fatigue, blurry  vision High fever, stiff muscles, increased sweating, fast or irregular heartbeat, and confusion, which may be signs of neuroleptic malignant syndrome High prolactin level--unexpected breast tissue growth, discharge from the nipple, change in sex drive or performance, irregular menstrual cycle Infection--fever, chills, cough, or sore throat Low blood pressure--dizziness, feeling faint or lightheaded, blurry vision Pain or trouble swallowing Seizures Stroke--sudden numbness or weakness of the face, arm, or leg, trouble speaking, confusion, trouble walking, loss of balance or coordination, dizziness, severe headache, change in vision Thoughts of suicide or self-harm, worsening mood, feelings of depression Trouble passing urine Uncontrolled and repetitive body movements, muscle stiffness or spasms, tremors or shaking, loss of balance or coordination, restlessness, shuffling walk, which may be signs of extrapyramidal symptoms (EPS) Side effects that usually do not require medical attention (report to your care team if they continue or are bothersome): Constipation Dizziness Drowsiness Dry mouth Headache Upset stomach Weight gain This list may not describe all possible side effects. Call your doctor for medical advice about side effects. You may report side effects to FDA at 1-800-FDA-1088. Where should I keep my medication? Keep out of the reach of children. Store at controlled room temperature between 15 and 30 degrees C (59 and 86 degrees F). Protect from light and moisture. Throw away any unused medication after the expiration date. NOTE: This sheet is a summary. It may not cover all possible information. If you have questions about this medicine, talk  to your doctor, pharmacist, or health care provider.  2024 Elsevier/Gold Standard (2022-10-03 00:00:00)
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
## 2023-08-03 ENCOUNTER — Other Ambulatory Visit: Payer: Self-pay | Admitting: Psychiatry

## 2023-08-03 DIAGNOSIS — F3181 Bipolar II disorder: Secondary | ICD-10-CM

## 2023-08-20 ENCOUNTER — Telehealth (INDEPENDENT_AMBULATORY_CARE_PROVIDER_SITE_OTHER): Payer: Self-pay | Admitting: Psychiatry

## 2023-08-20 DIAGNOSIS — Z91199 Patient's noncompliance with other medical treatment and regimen due to unspecified reason: Secondary | ICD-10-CM

## 2023-08-20 NOTE — Progress Notes (Signed)
 No response to call or text or video invite

## 2023-09-11 ENCOUNTER — Other Ambulatory Visit: Payer: Self-pay | Admitting: Psychiatry

## 2023-09-11 DIAGNOSIS — F3181 Bipolar II disorder: Secondary | ICD-10-CM

## 2023-09-11 DIAGNOSIS — F411 Generalized anxiety disorder: Secondary | ICD-10-CM

## 2023-11-01 ENCOUNTER — Telehealth: Payer: Self-pay | Admitting: Psychiatry

## 2023-11-01 NOTE — Telephone Encounter (Signed)
 Could you ask which medication she needs refill? thanks

## 2023-11-03 ENCOUNTER — Other Ambulatory Visit: Payer: Self-pay | Admitting: Psychiatry

## 2023-11-03 DIAGNOSIS — F411 Generalized anxiety disorder: Secondary | ICD-10-CM

## 2023-11-03 DIAGNOSIS — F3181 Bipolar II disorder: Secondary | ICD-10-CM

## 2023-11-03 MED ORDER — DULOXETINE HCL 30 MG PO CPEP: 30.0000 mg | ORAL_CAPSULE | Freq: Two times a day (BID) | ORAL | 0 refills | Status: DC

## 2023-11-03 NOTE — Telephone Encounter (Signed)
 Spoke to patient she stated that she needs a refill of DULoxetine  (CYMBALTA ) 30 MG capsule please advise

## 2023-11-03 NOTE — Telephone Encounter (Signed)
 Ordered

## 2023-11-28 ENCOUNTER — Other Ambulatory Visit: Payer: Self-pay | Admitting: Psychiatry

## 2023-11-28 DIAGNOSIS — F3181 Bipolar II disorder: Secondary | ICD-10-CM

## 2023-11-28 DIAGNOSIS — F411 Generalized anxiety disorder: Secondary | ICD-10-CM

## 2023-12-03 ENCOUNTER — Telehealth (INDEPENDENT_AMBULATORY_CARE_PROVIDER_SITE_OTHER): Admitting: Psychiatry

## 2023-12-03 ENCOUNTER — Encounter: Payer: Self-pay | Admitting: Psychiatry

## 2023-12-03 DIAGNOSIS — F411 Generalized anxiety disorder: Secondary | ICD-10-CM | POA: Diagnosis not present

## 2023-12-03 DIAGNOSIS — F424 Excoriation (skin-picking) disorder: Secondary | ICD-10-CM | POA: Diagnosis not present

## 2023-12-03 DIAGNOSIS — F439 Reaction to severe stress, unspecified: Secondary | ICD-10-CM | POA: Diagnosis not present

## 2023-12-03 DIAGNOSIS — F3181 Bipolar II disorder: Secondary | ICD-10-CM | POA: Diagnosis not present

## 2023-12-03 DIAGNOSIS — Z9189 Other specified personal risk factors, not elsewhere classified: Secondary | ICD-10-CM

## 2023-12-03 MED ORDER — ARIPIPRAZOLE 2 MG PO TABS: 2.0000 mg | ORAL_TABLET | Freq: Every morning | ORAL | 1 refills | Status: DC

## 2023-12-03 NOTE — Progress Notes (Signed)
 Virtual Visit via Video Note  I connected with Stephanie Ayala on 12/03/23 at 11:20 AM EDT by a video enabled telemedicine application and verified that I am speaking with the correct person using two identifiers.  Location Provider Location : [facility] Patient Location : Home  Participants: Patient , Provider   I discussed the limitations of evaluation and management by telemedicine and the availability of in person appointments. The patient expressed understanding and agreed to proceed.   I discussed the assessment and treatment plan with the patient. The patient was provided an opportunity to ask questions and all were answered. The patient agreed with the plan and demonstrated an understanding of the instructions.   The patient was advised to call back or seek an in-person evaluation if the symptoms worsen or if the condition fails to improve as anticipated.   BH MD OP Progress Note  12/03/2023 1:47 PM Stephanie Ayala  MRN:  [id number]  Chief Complaint:  Chief Complaint  Patient presents with   Follow-up   Anxiety   Depression   Medication Refill   Discussed the use of AI scribe software for clinical note transcription with the patient, who gave verbal consent to proceed.  History of Present Illness Stephanie Ayala is a 33 year old Caucasian female, married, currently lives in [place], has a history of bipolar disorder type II, generalized anxiety disorder, insomnia, ulcerative colitis, hypothyroidism was evaluated by telemedicine today.  She reports that her depression has worsened recently. Significant psychosocial stressors, including ongoing separation from her spouse, financial insecurity, and loss of access to therapy, contribute to her current difficulties. She describes lacking a support system and experiencing isolation. Her spouse has restricted her access to therapeutic activities such as playing video games, which she previously used to communicate with other adults and as a  coping mechanism. She notes that her spouse refuses to pay for her therapy and has removed funds from their bank account, making her life more challenging. She describes feeling treated as an employee rather than an equal partner in her marriage and identifies longstanding issues with communication and unresolved past grievances.  Mood instability, irritability, racing thoughts, and episodes of obsessive thinking continue to affect her. She identifies compulsive spending as a warning sign, referencing a past episode of spending a large sum of money impulsively. Sleep difficulties persist, and she cannot take medications that cause drowsiness because she needs to be able to wake up at night to care for her child. She notes that she is no longer breastfeeding.  Regarding safety, she denies current suicidal ideation and states that as long as her children are present and safe, she would not do anything to harm herself.  She denies any homicidality or perceptual disturbances.  She has not been taking olanzapine  2.5 mg due to excessive sedation and concern about being unable to wake up at night for her child. She confirms that she is currently taking Cymbalta  30 mg twice daily.  She used Abilify  in 2022 and stopped on 03/27/2021, reportedly around the time of pregnancy.  She is agreeable to trial.  She engaged in marriage counseling in the past, which provided temporary improvement. She also participated in individual therapy with Delon Shin in the past.    Visit Diagnosis:    ICD-10-CM   1. Bipolar 2 disorder, major depressive episode (HCC)  F31.81 ARIPiprazole  (ABILIFY ) 2 MG tablet   Mild with mixed features    2. GAD (generalized anxiety disorder)  F41.1  3. Trauma and stressor-related disorder  F43.9    Unspecified, rule out PTSD    4. Skin-picking disorder  F42.4     5. At risk for prolonged QT interval syndrome  Z91.89       Past Psychiatric History: I have reviewed past  psychiatric history from progress note on 12/21/2019.  Past trials of medications like Lamictal , olanzapine , Abilify   Past Medical History:  Past Medical History:  Diagnosis Date   Arthritis    Autoimmune disease (HCC)    Colitis    Depression    Fibromyalgia     Past Surgical History:  Procedure Laterality Date   COLONOSCOPY WITH PROPOFOL  N/A 11/26/2020   Procedure: COLONOSCOPY WITH PROPOFOL ;  Surgeon: Unk Corinn Skiff, MD;  Location: [facility] ENDOSCOPY;  Service: Gastroenterology;  Laterality: N/A;   OVARY SURGERY Bilateral    cyst removal    Family Psychiatric History: I have reviewed family psychiatric history from progress note on 12/21/2019.  Family History:  Family History  Problem Relation Age of Onset   Anxiety disorder Mother    Diabetes Father    Hypertension Father    Diabetes Paternal Uncle    Heart disease Paternal Uncle    Alcohol abuse Maternal Grandfather    Heart disease Paternal Grandfather     Social History: I have reviewed social history from progress note on 12/21/2019. Social History   Socioeconomic History   Marital status: Married    Spouse name:  Stephanie Ayala   Number of children: Not on file   Years of education: Not on file   Highest education level: Not on file  Occupational History   Occupation: Not employed  Tobacco Use   Smoking status: Former    Types: Cigarettes   Smokeless tobacco: Never  Vaping Use   Vaping status: Some Days  Substance and Sexual Activity   Alcohol use: Not Currently   Drug use: No   Sexual activity: Yes    Birth control/protection: I.U.D.  Other Topics Concern   Not on file  Social History Narrative   Lives in [place]; with daughter/room-mate. No smoking; no alcohol. On retirement disability.    Social Drivers of Corporate investment banker Strain: High Risk (09/20/2023)   Received from [facility]   Overall Financial Resource Strain (CARDIA)    Difficulty of Paying Living Expenses: Hard  Food  Insecurity: No Food Insecurity (09/20/2023)   [facility]   Hunger Vital Sign    Within the past 12 months, you worried that your food would run out before you got the money to buy more.: Never true    Within the past 12 months, the food you bought just didn't last and you didn't have money to get more.: Never true  Transportation Needs: No Transportation Needs (09/20/2023)   Received from [facility]    In the past 12 months, has lack of transportation kept you from medical appointments or from getting medications?: No    Lack of Transportation (Non-Medical): No  Physical Activity: Not on file  Stress: Not on file  Social Connections: Not on file    Allergies: No Known Allergies  Metabolic Disorder Labs: No results found for: HGBA1C, MPG No results found for: PROLACTIN Lab Results  Component Value Date   CHOL 204 (H) 02/12/2017   TRIG 327 (H) 11/13/2019   HDL 53 02/12/2017   CHOLHDL 3.8 02/12/2017   LDLCALC 138 (H) 02/12/2017  Lab Results  Component Value Date   TSH 3.040 02/12/2017    Therapeutic Level Labs: No results found for: LITHIUM No results found for: VALPROATE No results found for: CBMZ  Current Medications: Current Outpatient Medications  Medication Sig Dispense Refill   ARIPiprazole  (ABILIFY ) 2 MG tablet Take 1 tablet (2 mg total) by mouth in the morning. 30 tablet 1   budesonide  (ENTOCORT EC ) 3 MG 24 hr capsule Take 1 capsule (3 mg total) by mouth daily. 90 capsule 0   Cholecalciferol  (VITAMIN D-1000 MAX ST) 25 MCG (1000 UT) tablet Take by mouth.     DULoxetine  (CYMBALTA ) 30 MG capsule TAKE 1 CAPSULE BY MOUTH 2 TIMES DAILY. 60 capsule 0   levothyroxine (SYNTHROID) 50 MCG tablet Take 1 tablet by mouth daily.     Magnesium 250 MG TABS Take 500 mg by mouth.     mesalamine  (APRISO ) 0.375 g 24 hr capsule TAKE 4 CAPSULES (1.5 G TOTAL) BY MOUTH IN THE MORNING AND AT BEDTIME.  240 capsule 1   pregabalin (LYRICA) 50 MG capsule      No current facility-administered medications for this visit.     Musculoskeletal: Strength & Muscle Tone: UTA Gait & Station: Seated Patient leans: N/A  Psychiatric Specialty Exam: Review of Systems  Psychiatric/Behavioral:  Positive for dysphoric mood and sleep disturbance. The patient is nervous/anxious.        Mood swings    not currently breastfeeding.There is no height or weight on file to calculate BMI.  General Appearance: Casual  Eye Contact:  Fair  Speech:  Clear and Coherent  Volume:  Normal  Mood:  Anxious and Depressed  Affect:  Congruent  Thought Process:  Goal Directed and Descriptions of Associations: Intact  Orientation:  Full (Time, Place, and Person)  Thought Content: Logical   Suicidal Thoughts:  No  Homicidal Thoughts:  No  Memory:  Immediate;   Fair Recent;   Fair Remote;   Fair  Judgement:  Fair  Insight:  Fair  Psychomotor Activity:  Normal  Concentration:  Concentration: Fair and Attention Span: Fair  Recall:  [employer] of Knowledge: Fair  Language: Fair  Akathisia:  No  Handed:  Right  AIMS (if indicated): not done  Assets:  Communication Skills Desire for Improvement Housing Social Support Transportation  ADL's:  Intact  Cognition: WNL  Sleep:  varies due to having a young child    Screenings: AIMS    Flowsheet Row Video Visit from 08/05/2021 in [facility]  AIMS Total Score 0   GAD-7    Flowsheet Row Office Visit from 12/02/2022 in [facility] Video Visit from 02/26/2022 in [facility] Video Visit from 10/16/2021 in [facility] Video Visit from 08/05/2021 in [facility] Video Visit from 05/08/2021 in [facility]  Total GAD-7 Score 17 19 14 4 4     PHQ2-9    Flowsheet Row Office Visit from 12/02/2022 in [facility] Video Visit from 06/22/2022 in [facility] Video Visit from 02/26/2022 in [facility] Video Visit from 11/24/2021 in [facility] Video Visit from 10/16/2021 in [facility]  PHQ-2 Total Score 4 6 0 3 4  PHQ-9 Total Score 19 21 -- 7 19   Flowsheet Row Video Visit from  12/03/2023 in [facility] Video Visit from 07/09/2023 in [facility] Video Visit from 12/28/2022 in [facility]  C-SSRS RISK CATEGORY No Risk No Risk No Risk     Assessment and Plan: Stephanie Ayala is a 47 year old Caucasian female, married, has a history of bipolar disorder, anxiety disorder, recent skin picking, trauma related symptoms was evaluated by telemedicine today.  Discussed assessment and plan as noted below.  Assessment & Plan Bipolar II disorder with depressive symptoms-unstable Bipolar II disorder with mood instability, irritability, racing thoughts, decreased need for sleep, and impulsive spending.  Noncompliant on olanzapine  due to sedation. Abilify  is considered as an alternative mood stabilizer that may not cause drowsiness and can be taken in the morning. She is no longer breastfeeding. - Order EKG - Prescribe Abilify  2 mg to be taken in the morning - Discontinue Olanzapine   Generalized anxiety disorder-unstable Generalized anxiety disorder with ongoing symptoms. - Continue Cymbalta  30 mg twice daily. - Encouraged to reestablish care with therapist.  Excoriation (skin-picking) disorder-unstable Continues to struggle with the same. - Encouraged to reestablish care with therapist.  Trauma and stressor-related symptoms  unspecified-rule out PTSD Continues to struggle with her mood symptoms. - Continue Cymbalta  as prescribed. - Will benefit from psychotherapy sessions.  Patient encouraged to do so.  At risk for prolonged QT syndrome-I have ordered EKG.  Patient agrees to get this completed in the week from now.  This is to monitor her cardiac health while on Abilify .  Follow-up Follow-up in clinic in 3 to 4 weeks or sooner if needed.   Collaboration of Care: Collaboration of Care: Referral or follow-up with counselor/therapist AEB patient encouraged to reestablish care with the therapist to start psychotherapy.  Patient/Guardian was advised Release of Information must be obtained prior to any record release in order to collaborate their care with an outside provider. Patient/Guardian was advised if they have not already done so to contact the registration department to sign all necessary forms in order for us  to release information regarding their care.   Consent: Patient/Guardian gives verbal consent for treatment and assignment of benefits for services provided during this visit. Patient/Guardian expressed understanding and agreed to proceed.   This note was generated in part or whole with voice recognition software. Voice recognition is usually quite accurate but there are transcription errors that can and very often do occur. I apologize for any typographical errors that were not detected and corrected.    Errika Narvaiz, MD 12/03/2023, 1:47 PM

## 2023-12-03 NOTE — Patient Instructions (Signed)
Please call for EKG - [phone]   Aripiprazole Tablets What is this medication? ARIPIPRAZOLE (ay ri PIP ray zole) treats schizophrenia, bipolar I disorder, autism spectrum disorder, and Tourette disorder. It may also be used with antidepressant medications to treat depression. It works by balancing the levels of dopamine and serotonin in the brain, hormones that help regulate mood, behaviors, and thoughts. It belongs to a group of medications called antipsychotics. Antipsychotics can be used to treat several kinds of mental health conditions. This medicine may be used for other purposes; ask your health care provider or pharmacist if you have questions. COMMON BRAND NAME(S): Abilify What should I tell my care team before I take this medication? They need to know if you have any of these conditions: Dementia Diabetes Difficulty swallowing Have trouble controlling your muscles Heart disease History of irregular heartbeat History of stroke Low blood cell levels (white cells, red cells, and platelets) Low blood pressure Parkinson disease Seizures Suicidal thoughts, plans, or attempt by you or a family member Urges to engage in impulsive behaviors in ways that are unusual for you An unusual or allergic reaction to aripiprazole, other medications, foods, dyes, or preservatives Pregnant or trying to get pregnant Breastfeeding How should I use this medication? Take this medication by mouth with a glass of water. Take it as directed on the prescription label at the same time every day. You can take it with or without food. If it upsets your stomach, take it with food. Do not take your medication more often than directed. Keep taking it unless your care team tells you to stop. A special MedGuide will be given to you by the pharmacist with each prescription and refill. Be sure to read this information carefully each time. Talk to your care team about the use of this medication in children.  While it may be prescribed for children as young as 6 years for selected conditions, precautions do apply. Overdosage: If you think you have taken too much of this medicine contact a poison control center or emergency room at once. NOTE: This medicine is only for you. Do not share this medicine with others. What if I miss a dose? If you miss a dose, take it as soon as you can. If it is almost time for your next dose, take only that dose. Do not take double or extra doses. What may interact with this medication? Do not take this medication with any of the following: Brexpiprazole Cisapride Dextromethorphan; quinidine Dronedarone Metoclopramide Pimozide Quinidine Thioridazine This medication may also interact with the following: Antihistamines for allergy, cough, and cold Carbamazepine Certain medications for anxiety or sleep Certain medications for depression, such as amitriptyline, fluoxetine, paroxetine, or sertraline Certain medications for fungal infections, such as fluconazole, itraconazole, ketoconazole, posaconazole, or voriconazole Clarithromycin General anesthetics, such as halothane, isoflurane, methoxyflurane, or propofol Medications for Parkinson disease, such as levodopa Medications for blood pressure Medications for seizures Medications that relax muscles for surgery Opioid medications for pain Other medications that cause heart rhythm changes Phenothiazines, such as chlorpromazine or prochlorperazine Rifampin This list may not describe all possible interactions. Give your health care provider a list of all the medicines, herbs, non-prescription drugs, or dietary supplements you use. Also tell them if you smoke, drink alcohol, or use illegal drugs. Some items may interact with your medicine. What should I watch for while using this medication? Visit your care team for regular checks on your progress. Tell your care team if your symptoms do not start  to get better or if  they get worse. Do not suddenly stop taking this medication. You may develop a severe reaction. Your care team will tell you how much medication to take. If your care team wants you to stop the medication, the dose may be slowly lowered over time to avoid any side effects. Patients and their families should watch out for new or worsening depression or thoughts of suicide. Also watch out for sudden changes in feelings such as feeling anxious, agitated, panicky, irritable, hostile, aggressive, impulsive, severely restless, overly excited and hyperactive, or not being able to sleep. If this happens, especially at the beginning of antidepressant treatment or after a change in dose, call your care team. This medication may affect your coordination, reaction time, or judgment. Do not drive or operate machinery until you know how this medication affects you. Sit up or stand slowly to reduce the risk of dizzy or fainting spells. Drinking alcohol with this medication can increase the risk of these side effects. This medication can cause problems with controlling your body temperature. It can lower the response of your body to cold temperatures. If possible, stay indoors during cold weather. If you must go outdoors, wear warm clothes. It can also lower the response of your body to heat. Do not overheat. Do not over-exercise. Stay out of the sun when possible. If you must be in the sun, wear cool clothing. Drink plenty of water. If you have trouble controlling your body temperature, call your care team right away. This medication may cause dry eyes and blurred vision. If you wear contact lenses, you may feel some discomfort. Lubricating eye drops may help. See your care team if the problem does not go away or is severe. This medication may increase blood sugar. Ask your care team if changes in diet or medications are needed if you have diabetes. There have been reports of increased sexual urges or other strong urges such  as gambling while taking this medication. If you experience any of these while taking this medication, you should report this to your care team as soon as possible. What side effects may I notice from receiving this medication? Side effects that you should report to your care team as soon as possible: Allergic reactions--skin rash, itching, hives, swelling of the face, lips, tongue, or throat High blood sugar (hyperglycemia)--increased thirst or amount of urine, unusual weakness or fatigue, blurry vision High fever, stiff muscles, increased sweating, fast or irregular heartbeat, and confusion, which may be signs of neuroleptic malignant syndrome Low blood pressure--dizziness, feeling faint or lightheaded, blurry vision Pain or trouble swallowing Prolonged or painful erection Seizures Stroke--sudden numbness or weakness of the face, arm, or leg, trouble speaking, confusion, trouble walking, loss of balance or coordination, dizziness, severe headache, change in vision Uncontrolled and repetitive body movements, muscle stiffness or spasms, tremors or shaking, loss of balance or coordination, restlessness, shuffling walk, which may be signs of extrapyramidal symptoms (EPS) Thoughts of suicide or self-harm, worsening mood, feelings of depression Urges to engage in impulsive behaviors such as gambling, binge eating, sexual activity, or shopping in ways that are unusual for you Side effects that usually do not require medical attention (report these to your care team if they continue or are bothersome): Constipation Drowsiness Weight gain This list may not describe all possible side effects. Call your doctor for medical advice about side effects. You may report side effects to FDA at 1-800-FDA-1088. Where should I keep my medication? Keep out of  the reach of children and pets. Store at room temperature between 15 and 30 degrees C (59 and 86 degrees F). Throw away any unused medication after the  expiration date. NOTE: This sheet is a summary. It may not cover all possible information. If you have questions about this medicine, talk to your doctor, pharmacist, or health care provider.  2024 Elsevier/Gold Standard (2021-10-04 00:00:00)

## 2023-12-27 ENCOUNTER — Ambulatory Visit (INDEPENDENT_AMBULATORY_CARE_PROVIDER_SITE_OTHER): Admitting: Psychiatry

## 2023-12-27 ENCOUNTER — Encounter: Payer: Self-pay | Admitting: Psychiatry

## 2023-12-27 ENCOUNTER — Other Ambulatory Visit: Payer: Self-pay | Admitting: Psychiatry

## 2023-12-27 VITALS — BP 106/74 | HR 93 | Temp 97.8°F | Ht 62.0 in | Wt 197.0 lb

## 2023-12-27 DIAGNOSIS — F424 Excoriation (skin-picking) disorder: Secondary | ICD-10-CM

## 2023-12-27 DIAGNOSIS — F411 Generalized anxiety disorder: Secondary | ICD-10-CM

## 2023-12-27 DIAGNOSIS — F3181 Bipolar II disorder: Secondary | ICD-10-CM | POA: Diagnosis not present

## 2023-12-27 DIAGNOSIS — F439 Reaction to severe stress, unspecified: Secondary | ICD-10-CM

## 2023-12-27 MED ORDER — DULOXETINE HCL 30 MG PO CPEP: 30.0000 mg | ORAL_CAPSULE | Freq: Two times a day (BID) | ORAL | 1 refills | Status: DC

## 2023-12-27 NOTE — Progress Notes (Signed)
 BH MD OP Progress Note  12/27/2023 5:11 PM Stephanie Ayala  MRN:  [id number]  Chief Complaint:  Chief Complaint  Patient presents with   Follow-up   Anxiety   Depression   Manic Behavior   Medication Refill   Discussed the use of AI scribe software for clinical note transcription with the patient, who gave verbal consent to proceed.  History of Present Illness Stephanie Ayala is a 33 year old Caucasian female,  married, currently lives in [place], has a history of bipolar disorder type II, generalized anxiety disorder, insomnia, ulcerative colitis, hypothyroidism was evaluated in office today for a follow-up appointment.  She reports ongoing significant psychosocial stress related to the process of separating from her partner. She continues to cohabitate due to financial constraints, with her partner controlling access to funds and requiring her to request money for household and childcare expenses. She identifies this dynamic as stressful and feels her options are limited. Reading and playing video games serve as her primary coping mechanisms, but her partner restricts her ability to play video games in the house, leading her to set up a separate space in a shed. Additional stress arises from running up her credit card due to her partner's refusal to pay for most personal expenses. She receives limited support from her mother, who disagrees with some of her decisions, which adds to her stress.  She describes experiencing both depressive and manic symptoms. She reports persistent low mood, feelings of hopelessness, and frequent thoughts of not wanting to be alive, but she states that her 2 daughters are a protective factor and that she would not act on these thoughts as long as they are present. She endorses brain fog, difficulty thinking clearly, and low energy, noting that when she has time to herself, she prefers to rest or nap. She reports some improvement in sleep compared to prior, but she  continues to feel fatigued. She also describes symptoms suggestive of mania, including increased hypersexuality and urges to spend money on unnecessary items. She describes experiencing both depressive and manic symptoms and is trying to understand her experience of bipolar disorder.  She states that she is currently taking Cymbalta  60 mg daily, Abilify  2 mg daily.She confirms adherence to her medications. She recently completed a course of amoxicillin for a dental procedure (tooth extraction) as of the night before the visit. She denies any medication side effects such as muscle spasms, rigidity, tremors, or stiffness.  Currently denies any homicidality or perceptual disturbances.    Visit Diagnosis:    ICD-10-CM   1. Bipolar 2 disorder, major depressive episode (HCC)  F31.81 DULoxetine  (CYMBALTA ) 30 MG capsule   Mild with mixed features    2. GAD (generalized anxiety disorder)  F41.1 DULoxetine  (CYMBALTA ) 30 MG capsule    3. Trauma and stressor-related disorder  F43.9    Unspecified rule out PTSD    4. Skin-picking disorder  F42.4       Past Psychiatric History: I have reviewed past psychiatric history from progress note on 12/21/2019.  Past Medical History:  Past Medical History:  Diagnosis Date   Arthritis    Autoimmune disease    Colitis    Depression    Fibromyalgia     Past Surgical History:  Procedure Laterality Date   COLONOSCOPY WITH PROPOFOL  N/A 11/26/2020   Procedure: COLONOSCOPY WITH PROPOFOL ;  Surgeon: Unk Corinn Skiff, MD;  Location: [facility] END[facility];  Service: Gastroenterology;  Laterality: N/A;   OVARY SURGERY Bilateral    cyst removal  Family Psychiatric History: I have reviewed family psychiatric history from progress note on 12/21/2019.  Family History:  Family History  Problem Relation Age of Onset   Anxiety disorder Mother    Diabetes Father    Hypertension Father    Diabetes Paternal Uncle    Heart disease Paternal Uncle    Alcohol abuse  Maternal Grandfather    Heart disease Paternal Grandfather     Social History: I have reviewed social history from progress note on 12/21/2019. Social History   Socioeconomic History   Marital status: Married    Spouse name:  Stephanie Ayala   Number of children: Not on file   Years of education: Not on file   Highest education level: Not on file  Occupational History   Occupation: Not employed  Tobacco Use   Smoking status: Former    Types: Cigarettes, E-cigarettes   Smokeless tobacco: Never   Tobacco comments:    Vaps daily  Vaping Use   Vaping status: Some Days  Substance and Sexual Activity   Alcohol use: Not Currently   Drug use: No   Sexual activity: Not Currently    Birth control/protection: I.U.D.  Other Topics Concern   Not on file  Social History Narrative   Lives in [place]; with daughter/room-mate. No smoking; no alcohol. On retirement disability.    Social Drivers of Corporate investment banker Strain: High Risk (09/20/2023)   Received from [facility]   Overall Financial Resource Strain (CARDIA)    Difficulty of Paying Living Expenses: Hard  Food Insecurity: No Food Insecurity (09/20/2023)   Received from [facility]   Hunger Vital Sign    Within the past 12 months, you worried that your food would run out before you got the money to buy more.: Never true    Within the past 12 months, the food you bought just didn't last and you didn't have money to get more.: Never true  Transportation Needs: No Transportation Needs (09/20/2023)   Received from [facility] - Transportation    In the past 12 months, has lack of transportation kept you from medical appointments or from getting medications?: No    Lack of Transportation (Non-Medical): No  Physical Activity: Not on file  Stress: Not on file  Social Connections: Not on file    Allergies: No Known Allergies  Metabolic Disorder Labs: No results found  for: HGBA1C, MPG No results found for: PROLACTIN Lab Results  Component Value Date   CHOL 204 (H) 02/12/2017   TRIG 327 (H) 11/13/2019   HDL 53 02/12/2017   CHOLHDL 3.8 02/12/2017   LDLCALC 138 (H) 02/12/2017   Lab Results  Component Value Date   TSH 3.040 02/12/2017    Therapeutic Level Labs: No results found for: LITHIUM No results found for: VALPROATE No results found for: CBMZ  Current Medications: Current Outpatient Medications  Medication Sig Dispense Refill   ARIPiprazole  (ABILIFY ) 2 MG tablet Take 1 tablet (2 mg total) by mouth in the morning. 90 tablet 0   levothyroxine (SYNTHROID) 50 MCG tablet Take 1 tablet by mouth daily.     mesalamine  (APRISO ) 0.375 g 24 hr capsule TAKE 4 CAPSULES (1.5 G TOTAL) BY MOUTH IN THE MORNING AND AT BEDTIME. 240 capsule 1   pregabalin (LYRICA) 50 MG capsule      budesonide  (ENTOCORT EC ) 3 MG 24 hr capsule Take 1 capsule (3 mg total) by mouth daily. (Patient not taking:  Reported on 12/27/2023) 90 capsule 0   Cholecalciferol  (VITAMIN D-1000 MAX ST) 25 MCG (1000 UT) tablet Take by mouth. (Patient not taking: Reported on 12/27/2023)     DULoxetine  (CYMBALTA ) 30 MG capsule Take 1 capsule (30 mg total) by mouth 2 (two) times daily. 60 capsule 1   Magnesium 250 MG TABS Take 500 mg by mouth. (Patient not taking: Reported on 12/27/2023)     No current facility-administered medications for this visit.     Musculoskeletal: Strength & Muscle Tone: within normal limits Gait & Station: normal Patient leans: N/A  Psychiatric Specialty Exam: Review of Systems  Psychiatric/Behavioral:  Positive for dysphoric mood and sleep disturbance. The patient is nervous/anxious.     Blood pressure 106/74, pulse 93, temperature 97.8 F (36.6 C), temperature source Temporal, height 5' 2 (1.575 m), weight 197 lb (89.4 kg), SpO2 98%.Body mass index is 36.03 kg/m.  General Appearance: Casual  Eye Contact:  Good  Speech:  Clear and Coherent  Volume:   Normal  Mood:  Anxious and Depressed  Affect:  Congruent  Thought Process:  Goal Directed and Descriptions of Associations: Intact  Orientation:  Full (Time, Place, and Person)  Thought Content: Logical   Suicidal Thoughts:  No  Homicidal Thoughts:  No  Memory:  Immediate;   Fair Recent;   Fair Remote;   Fair  Judgement:  Fair  Insight:  Fair  Psychomotor Activity:  Normal  Concentration:  Concentration: Fair and Attention Span: Fair  Recall:  [employer] of Knowledge: Fair  Language: Fair  Akathisia:  No  Handed:  Right  AIMS (if indicated): not done  Assets:  Communication Skills Desire for Improvement Housing Social Support  ADL's:  Intact  Cognition: WNL  Sleep:  varies   Screenings: Geneticist, molecular Office Visit from 12/27/2023 in [facility] Video Visit from 08/05/2021 in [facility]  AIMS Total Score 0 0   GAD-7    Flowsheet Row Office Visit from 12/27/2023 in [facility] Office Visit from 12/02/2022 in [facility] Video Visit from 02/26/2022 in [facility] Video Visit from 10/16/2021 in [facility] Video Visit from 08/05/2021 in [facility]  Total GAD-7 Score 19 17 19 14 4    PHQ2-9    Flowsheet Row Office Visit from 12/27/2023 in [facility] Office Visit from 12/02/2022 in [facility] Video Visit from 06/22/2022 in [facility] Video Visit from 02/26/2022 in [facility] Video Visit from 11/24/2021 in [facility]  PHQ-2 Total Score 5 4 6  0 3  PHQ-9 Total Score 21 19 21  -- 7   Flowsheet Row  Office Visit from 12/27/2023 in [facility] Video Visit from 12/03/2023 in [facility] Video Visit from 07/09/2023 in [facility]  C-SSRS RISK CATEGORY Error: Q3, 4, or 5 should not be populated when Q2 is No No Risk No Risk     Assessment and Plan: MYREL RAPPLEYE is a 33 year old Caucasian female who presented for a follow-up appointment.  Discussed assessment and plan as noted below.  1. Bipolar 2 disorder, major depressive episode (HCC)-mild, mixed features-unstable Ongoing depression/hypomanic symptoms.  Has been noncompliant with EKG.  Once EKG completed will consider increasing Abilify . Continue Abilify  2 mg daily for now. Continue Cymbalta  30 mg twice daily  2. GAD (generalized anxiety disorder)-unstable Anxiety symptoms mostly because of situational stressors.  Not compliant with psychotherapy sessions. Encouraged to establish care with therapist. Continue Cymbalta  30 mg twice daily  3. Trauma and stressor-related disorder unspecified-rule out PTSD-unstable Continues to struggle with mood symptoms, has been noncompliant with psychotherapy sessions. Encouraged patient to restart CBT  4. Skin-picking disorder-unstable Reports no significant changes due to ongoing situational anxiety which is a trigger. Encouraged to establish care with therapist.  Patient has been noncompliant with the EKG.  Encouraged to get it completed, plan to increase Abilify  once Complete.  Also provided resources to establish care with a therapist in the community.  Follow-up Follow-up in clinic in 4 weeks or sooner if needed.  Collaboration of Care: Collaboration of Care: Referral or follow-up with counselor/therapist AEB provider resources.  Patient/Guardian was advised Release of Information must be obtained prior to any record release in order to collaborate their care with an outside  provider. Patient/Guardian was advised if they have not already done so to contact the registration department to sign all necessary forms in order for us  to release information regarding their care.   Consent: Patient/Guardian gives verbal consent for treatment and assignment of benefits for services provided during this visit. Patient/Guardian expressed understanding and agreed to proceed.  This note was generated in part or whole with voice recognition software. Voice recognition is usually quite accurate but there are transcription errors that can and very often do occur. I apologize for any typographical errors that were not detected and corrected.     Analyah Mcconnon, MD 12/27/2023, 5:11 PM

## 2023-12-27 NOTE — Patient Instructions (Signed)
 Please call for [phone]

## 2024-01-12 ENCOUNTER — Ambulatory Visit (INDEPENDENT_AMBULATORY_CARE_PROVIDER_SITE_OTHER): Admitting: Licensed Practical Nurse

## 2024-01-12 ENCOUNTER — Encounter: Payer: Self-pay | Admitting: Licensed Practical Nurse

## 2024-01-12 VITALS — BP 116/82 | HR 91 | Ht 62.0 in | Wt 198.9 lb

## 2024-01-12 DIAGNOSIS — Z3202 Encounter for pregnancy test, result negative: Secondary | ICD-10-CM

## 2024-01-12 DIAGNOSIS — Z30430 Encounter for insertion of intrauterine contraceptive device: Secondary | ICD-10-CM | POA: Diagnosis not present

## 2024-01-12 LAB — POCT URINE PREGNANCY: Preg Test, Ur: NEGATIVE

## 2024-01-12 MED ORDER — LEVONORGESTREL 20 MCG/DAY IU IUD
1.0000 | INTRAUTERINE_SYSTEM | Freq: Once | INTRAUTERINE | Status: AC
  Administered 2024-01-12: 1 via INTRAUTERINE

## 2024-01-12 NOTE — Progress Notes (Signed)
    SUBJECTIVE: Here for Merina IUD  insertions  OBJECTIVE: BP 116/82 (BP Location: Right Arm, Patient Position: Sitting, Cuff Size: Normal)   Pulse 91   Ht 5' 2 (1.575 m)   Wt 198 lb 14.4 oz (90.2 kg)   LMP 12/31/2023 (Approximate)   BMI 36.38 kg/m   Gen: NAD   GYNECOLOGY OFFICE PROCEDURE NOTE  Stephanie Ayala is a 33 y.o. G3P1011 here for Mirena  IUD insertion. No GYN concerns.  Last pap smear was on 05/2019 and was normal.  The patient is currently using nothing for contraception and her LMP is Patient's last menstrual period was 12/31/2023 (approximate)..  The indication for her IUD is contraception/cycle control.  IUD Insertion Procedure Note Patient identified, informed consent performed, consent signed.   Discussed risks of irregular bleeding, cramping, infection, malpositioning, expulsion or uterine perforation of the IUD (1:1000 placements)  which may require further procedure such as laparoscopy.  IUD while effective at preventing pregnancy do not prevent transmission of sexually transmitted diseases and use of barrier methods for this purpose was discussed. Time out was performed.  Urine pregnancy test negative.  Speculum placed in the vagina.  Cervix visualized.  Cleaned with Betadine x 2.  Grasped anteriorly with a single tooth tenaculum.  Uterus sounded to 8 cm. IUD placed per manufacturer's recommendations.  Strings trimmed to 3 cm. Tenaculum was removed, good hemostasis noted.  Patient tolerated procedure well.   Patient was given post-procedure instructions.  She was advised to have backup contraception for one week.  Patient was also asked to check IUD strings periodically and follow up in 6 weeks for IUD check.  RTC 6wks for annual exam   JINNIE HERO [facility], CNM  01/12/2511:00 PM

## 2024-01-25 ENCOUNTER — Encounter: Payer: Self-pay | Admitting: Psychiatry

## 2024-01-25 ENCOUNTER — Telehealth (INDEPENDENT_AMBULATORY_CARE_PROVIDER_SITE_OTHER): Admitting: Psychiatry

## 2024-01-25 DIAGNOSIS — F439 Reaction to severe stress, unspecified: Secondary | ICD-10-CM

## 2024-01-25 DIAGNOSIS — G4710 Hypersomnia, unspecified: Secondary | ICD-10-CM | POA: Insufficient documentation

## 2024-01-25 DIAGNOSIS — F3181 Bipolar II disorder: Secondary | ICD-10-CM | POA: Diagnosis not present

## 2024-01-25 DIAGNOSIS — F411 Generalized anxiety disorder: Secondary | ICD-10-CM | POA: Diagnosis not present

## 2024-01-25 DIAGNOSIS — F424 Excoriation (skin-picking) disorder: Secondary | ICD-10-CM

## 2024-01-25 DIAGNOSIS — Z91199 Patient's noncompliance with other medical treatment and regimen due to unspecified reason: Secondary | ICD-10-CM

## 2024-01-25 DIAGNOSIS — Z9189 Other specified personal risk factors, not elsewhere classified: Secondary | ICD-10-CM

## 2024-01-25 NOTE — Patient Instructions (Addendum)
 Please call for EKG - [phone]    [email or web address]  [email or web address]  [email or web address] [facility] [address], [place], [place] [address]   [phone]  [facility] [email or web address] [address], [place], [place] [address]  [phone]  [facility] [email or web address] [address] [place], [place] [address]  [phone]   [facility] - [phone]  [facility] - [phone]  [facility] counseling - [phone] counseling [phone]  [facility] - [phone]    [facility] [address] [place], [place] [address]  [phone]    Bel[facility] Chancy [address] [place], [place] [address]  [phone]    [facility] [address] [place], [place] [address]  [phone]

## 2024-01-25 NOTE — Progress Notes (Signed)
 Virtual Visit via Video Note  I connected with Stephanie Ayala on 01/25/24 at 11:30 AM EDT by a video enabled telemedicine application and verified that I am speaking with the correct person using two identifiers.  Location Provider Location : [facility] Patient Location : Home  Participants: Patient , Provider    I discussed the limitations of evaluation and management by telemedicine and the availability of in person appointments. The patient expressed understanding and agreed to proceed.   I discussed the assessment and treatment plan with the patient. The patient was provided an opportunity to ask questions and all were answered. The patient agreed with the plan and demonstrated an understanding of the instructions.   The patient was advised to call back or seek an in-person evaluation if the symptoms worsen or if the condition fails to improve as anticipated.  BH MD OP Progress Note  01/25/2024 12:02 PM Stephanie Ayala  MRN:  [id number]  Chief Complaint:  Chief Complaint  Patient presents with   Follow-up   Anxiety   Depression   Medication Refill   Manic Behavior   Discussed the use of AI scribe software for clinical note transcription with the patient, who gave verbal consent to proceed.  History of Present Illness Stephanie Ayala is a 33 year old Caucasian female, married, currently lives in [place], has a history of bipolar disorder type II, generalized anxiety disorder, insomnia, ulcerative colitis, hypothyroidism was evaluated by telemedicine today for a follow-up appointment.  She reports experiencing extreme exhaustion daily, describing persistent fatigue even after what she considers an improved sleep schedule, going to bed around 11 PM and waking up between 7 and 8 AM. She frequently falls asleep during activities such as dental appointments and while at home. Overwhelmed and stressed, particularly with childcare responsibilities and lack of support, she identifies these factors  as contributing to her exhaustion. She notes that her energy remains so low that she has not been able to pursue therapy or other self-care activities.  Ongoing stress and feeling overwhelmed stem from her current living situation, ongoing separation from her husband, and lack of support from her mother, who does not assist with childcare and does not support the divorce. She reports that her husband works overtime and extra jobs, leaving her with all childcare responsibilities, which she finds stressful and exhausting. She remains concerned about losing her support system and feels unable to find time for herself.  She takes Abilify  daily, Cymbalta  30 mg twice daily (though she often takes both doses at night), Lyrica at night around 9 PM, and Synthroid (levothyroxine), which she picked up 1 to 2 weeks ago. She denies any changes to her medications, allergies, or medical problems since the last visit. She has not completed a recommended EKG because she has not had free time. She denies any thoughts of hurting herself or others.  She is currently noncompliant with psychotherapy, was referred for the same.   Visit Diagnosis:    ICD-10-CM   1. Bipolar 2 disorder, major depressive episode (HCC)  F31.81 EKG 12-Lead   Moderate    2. GAD (generalized anxiety disorder)  F41.1     3. Trauma and stressor-related disorder  F43.9    Unspecified rule out PTSD    4. Skin-picking disorder  F42.4     5. At risk for prolonged QT interval syndrome  Z91.89 EKG 12-Lead    6. Hypersomnia  G47.10 Ambulatory referral to Pulmonology    7. Noncompliance with treatment plan  Z91.199  Past Psychiatric History: I have reviewed past psychiatric history from progress note on 12/21/2019.  Past Medical History:  Past Medical History:  Diagnosis Date   Arthritis    Autoimmune disease    Colitis    Depression    Fibromyalgia     Past Surgical History:  Procedure Laterality Date   COLONOSCOPY WITH  PROPOFOL  N/A 11/26/2020   Procedure: COLONOSCOPY WITH PROPOFOL ;  Surgeon: Unk Corinn Skiff, MD;  Location: [facility] END[facility];  Service: Gastroenterology;  Laterality: N/A;   OVARY SURGERY Bilateral    cyst removal    Family Psychiatric History: Reviewed family psychiatric history from progress note on 12/21/2019.  Family History:  Family History  Problem Relation Age of Onset   Anxiety disorder Mother    Diabetes Father    Hypertension Father    Diabetes Paternal Uncle    Heart disease Paternal Uncle    Alcohol abuse Maternal Grandfather    Heart disease Paternal Grandfather     Social History: I have reviewed social history from progress note on 12/21/2019. Social History   Socioeconomic History   Marital status: Married    Spouse name:  Dylan   Number of children: Not on file   Years of education: Not on file   Highest education level: Not on file  Occupational History   Occupation: Not employed  Tobacco Use   Smoking status: Former    Types: Cigarettes, E-cigarettes   Smokeless tobacco: Never   Tobacco comments:    Vaps daily  Vaping Use   Vaping status: Some Days  Substance and Sexual Activity   Alcohol use: Not Currently   Drug use: No   Sexual activity: Not Currently    Birth control/protection: I.U.D.  Other Topics Concern   Not on file  Social History Narrative   Lives in [place]; with daughter/room-mate. No smoking; no alcohol. On retirement disability.    Social Drivers of Corporate Investment Banker Strain: High Risk (09/20/2023)   Received from [facility]   Overall Financial Resource Strain (CARDIA)    Difficulty of Paying Living Expenses: Hard  Food Insecurity: No Food Insecurity (09/20/2023)   Received from [facility]   Hunger Vital Sign    Within the past 12 months, you worried that your food would run out before you got the money to buy more.: Never true    Within the past 12 months, the food you bought just  didn't last and you didn't have money to get more.: Never true  Transportation Needs: No Transportation Needs (09/20/2023)   Received from [facility] - Transportation    In the past 12 months, has lack of transportation kept you from medical appointments or from getting medications?: No    Lack of Transportation (Non-Medical): No  Physical Activity: Not on file  Stress: Not on file  Social Connections: Not on file    Allergies: No Known Allergies  Metabolic Disorder Labs: No results found for: HGBA1C, MPG No results found for: PROLACTIN Lab Results  Component Value Date   CHOL 204 (H) 02/12/2017   TRIG 327 (H) 11/13/2019   HDL 53 02/12/2017   CHOLHDL 3.8 02/12/2017   LDLCALC 138 (H) 02/12/2017   Lab Results  Component Value Date   TSH 3.040 02/12/2017    Therapeutic Level Labs: No results found for: LITHIUM No results found for: VALPROATE No results found for: CBMZ  Current Medications: Current Outpatient Medications  Medication Sig  Dispense Refill   ARIPiprazole  (ABILIFY ) 2 MG tablet Take 1 tablet (2 mg total) by mouth in the morning. 90 tablet 0   DULoxetine  (CYMBALTA ) 30 MG capsule Take 1 capsule (30 mg total) by mouth 2 (two) times daily. 60 capsule 1   levothyroxine (SYNTHROID) 50 MCG tablet Take 1 tablet by mouth daily.     mesalamine  (APRISO ) 0.375 g 24 hr capsule TAKE 4 CAPSULES (1.5 G TOTAL) BY MOUTH IN THE MORNING AND AT BEDTIME. 240 capsule 1   pregabalin (LYRICA) 50 MG capsule      No current facility-administered medications for this visit.     Musculoskeletal: Strength & Muscle Tone: UTA Gait & Station: Seated Patient leans: N/A  Psychiatric Specialty Exam: Review of Systems  Psychiatric/Behavioral:  Positive for dysphoric mood and sleep disturbance. The patient is nervous/anxious.     Last menstrual period 12/31/2023.There is no height or weight on file to calculate BMI.  General Appearance: Casual  Eye  Contact:  Fair  Speech:  Clear and Coherent  Volume:  Normal  Mood:  Anxious and Depressed  Affect:  Congruent  Thought Process:  Goal Directed and Descriptions of Associations: Intact  Orientation:  Full (Time, Place, and Person)  Thought Content: Logical   Suicidal Thoughts:  No  Homicidal Thoughts:  No  Memory:  Immediate;   Fair Recent;   Fair Remote;   Fair  Judgement:  Fair  Insight:  Fair  Psychomotor Activity:  Normal  Concentration:  Concentration: Fair and Attention Span: Fair  Recall:  [employer] of Knowledge: Fair  Language: Fair  Akathisia:  No  Handed:  Right  AIMS (if indicated): not done  Assets:  Communication Skills Desire for Improvement Housing Transportation  ADL's:  Intact  Cognition: WNL  Sleep:  Excessive   Screenings: AIMS    Flowsheet Row Office Visit from 12/27/2023 in [facility] Video Visit from 08/05/2021 in [facility]  AIMS Total Score 0 0   GAD-7    Flowsheet Row Office Visit from 12/27/2023 in [facility] Office Visit from 12/02/2022 in [facility] Video Visit from 02/26/2022 in [facility] Video Visit from 10/16/2021 in [facility] Video Visit from 08/05/2021 in [facility]  Total GAD-7 Score 19 17 19 14 4    PHQ2-9    Flowsheet Row Office Visit from 12/27/2023 in [facility] Office Visit from 12/02/2022 in [facility] Video Visit from 06/22/2022 in [facility] Video Visit from 02/26/2022 in [facility] Video Visit from 11/24/2021 in [facility]  PHQ-2 Total Score 5 4 6   0 3  PHQ-9 Total Score 21 19 21  -- 7   Flowsheet Row Video Visit from 01/25/2024 in [facility] Office Visit from 12/27/2023 in [facility] Video Visit from 12/03/2023 in [facility]  C-SSRS RISK CATEGORY Moderate Risk Moderate Risk No Risk     Assessment and Plan: CALEIGH RABELO is a 33 year old Caucasian female who presented for a follow-up appointment, discussed assessment and plan as noted below.  1. Bipolar 2 disorder, major depressive episode (HCC)-unstable Patient with ongoing depression symptoms.  She continues to be noncompliant with the EKG and hence  unable to readjust her Abilify  dosage as  discussed previously. Patient encouraged to get EKG completed, I have placed another order and provided phone number. Continue Abilify  2 mg daily Continue Cymbalta  30 mg twice daily.  Patient to take Cymbalta  as a one-time dose in the evening if it does make her drowsy.  2. GAD (generalized anxiety disorder)-unstable Anxiety regarding situational stressors, separation from spouse although they live at the same home. Noncompliant with psychotherapy referral. Encouraged to start CBT, I have advised staff to place this patient on our wait list with in-house therapist Continue Cymbalta  as prescribed  3. Trauma and stressor-related disorder-rule out PTSD-unstable Patient continues to struggle with her mood symptoms and is noncompliant with recommendations. Provided education, encouraged compliance.  4. Skin-picking disorder-unstable Patient reports no significant changes. Encouraged to start CBT.  She has been noncompliant.  5. At risk for prolonged QT interval syndrome I have placed another order for EKG.  Patient to call [phone].  6. Hypersomnia-unstable Patient reports excessive sleepiness during the day. Encouraged to take medications like Lyrica, Cymbalta  at the  end of the day. Ambulatory referral for sleep study.  7. Noncompliance with treatment plan-unstable Patient noncompliant with recommendations including referral for therapy as well as EKG.  Unable to make medication changes today due to the same. Patient encouraged to get EKG completed.  Patient educated about the need for psychotherapy sessions.  Follow-up Patient advised to get EKG completed and schedule a follow-up appointment for further medication changes. In the meantime patient advised to establish care with therapist.  I have also communicated with staff to place this patient on our wait list for in-house therapist.  Patient was also provided resources in the community previously as well as at this appointment.     Collaboration of Care: Collaboration of Care: Other patient referred for sleep study as well as for psychotherapy.  Patient/Guardian was advised Release of Information must be obtained prior to any record release in order to collaborate their care with an outside provider. Patient/Guardian was advised if they have not already done so to contact the registration department to sign all necessary forms in order for us  to release information regarding their care.   Consent: Patient/Guardian gives verbal consent for treatment and assignment of benefits for services provided during this visit. Patient/Guardian expressed understanding and agreed to proceed.   This note was generated in part or whole with voice recognition software. Voice recognition is usually quite accurate but there are transcription errors that can and very often do occur. I apologize for any typographical errors that were not detected and corrected.    Stephanie Mckibben, MD 01/25/2024, 12:02 PM

## 2024-02-01 ENCOUNTER — Ambulatory Visit: Admitting: Sleep Medicine

## 2024-02-11 ENCOUNTER — Ambulatory Visit: Payer: Self-pay | Admitting: Sleep Medicine

## 2024-02-17 ENCOUNTER — Ambulatory Visit: Payer: Self-pay | Admitting: Sleep Medicine

## 2024-02-21 ENCOUNTER — Ambulatory Visit: Payer: Self-pay | Admitting: Licensed Practical Nurse

## 2024-02-23 ENCOUNTER — Encounter: Payer: Self-pay | Admitting: Obstetrics and Gynecology

## 2024-03-06 ENCOUNTER — Ambulatory Visit: Payer: Self-pay | Admitting: Sleep Medicine

## 2024-03-14 ENCOUNTER — Other Ambulatory Visit: Payer: Self-pay | Admitting: Psychiatry

## 2024-03-14 DIAGNOSIS — F3181 Bipolar II disorder: Secondary | ICD-10-CM

## 2024-03-14 DIAGNOSIS — F411 Generalized anxiety disorder: Secondary | ICD-10-CM

## 2024-04-04 ENCOUNTER — Telehealth: Payer: Self-pay | Admitting: Gastroenterology

## 2024-04-04 NOTE — Telephone Encounter (Signed)
 The patient called to schedule an appointment for medication refills. She is an established patient who previously saw Dr. Unk. The appointment was scheduled with Grayce Bohr on 04/19/2024 at 10:00 AM.

## 2024-04-11 ENCOUNTER — Other Ambulatory Visit: Payer: Self-pay | Admitting: Psychiatry

## 2024-04-11 ENCOUNTER — Telehealth: Payer: Self-pay | Admitting: Psychiatry

## 2024-04-11 DIAGNOSIS — F411 Generalized anxiety disorder: Secondary | ICD-10-CM

## 2024-04-11 DIAGNOSIS — F3181 Bipolar II disorder: Secondary | ICD-10-CM

## 2024-04-11 NOTE — Telephone Encounter (Signed)
 Please schedule patient for an in person visit for any further refills.

## 2024-04-12 ENCOUNTER — Ambulatory Visit: Admitting: Sleep Medicine

## 2024-04-12 ENCOUNTER — Encounter: Payer: Self-pay | Admitting: Sleep Medicine

## 2024-04-12 ENCOUNTER — Ambulatory Visit
Admission: RE | Admit: 2024-04-12 | Discharge: 2024-04-12 | Disposition: A | Discharge status: Home / Self Care | Source: Ambulatory Visit | Attending: Psychiatry | Admitting: Psychiatry

## 2024-04-12 VITALS — BP 126/80 | HR 84 | Temp 99.4°F | Ht 62.0 in | Wt 216.0 lb

## 2024-04-12 DIAGNOSIS — G4710 Hypersomnia, unspecified: Secondary | ICD-10-CM | POA: Diagnosis not present

## 2024-04-12 DIAGNOSIS — G4733 Obstructive sleep apnea (adult) (pediatric): Secondary | ICD-10-CM

## 2024-04-12 DIAGNOSIS — F319 Bipolar disorder, unspecified: Secondary | ICD-10-CM | POA: Diagnosis not present

## 2024-04-12 DIAGNOSIS — E669 Obesity, unspecified: Secondary | ICD-10-CM

## 2024-04-12 DIAGNOSIS — Z87891 Personal history of nicotine dependence: Secondary | ICD-10-CM | POA: Diagnosis not present

## 2024-04-12 NOTE — Progress Notes (Signed)
 "      Name:Stephanie Ayala MRN: [id number] DOB: [birth year]   CHIEF COMPLAINT:  EXCESSIVE DAYTIME SLEEPINESS   HISTORY OF PRESENT ILLNESS: Stephanie Ayala is a 34 y.o. w/ a h/o bipolar disorder, anxiety, depression, UC, hypothyroidism and obesity who present for c/o intermittent snoring and excessive daytime sleepiness which has been present for several years. Reports nocturnal awakenings due to unclear reasons, however does not have difficulty falling back to sleep. Reports significant weight changes. Admits to night sweats. Denies morning headaches, RLS symptoms, dream enactment, cataplexy, hypnagogic or hypnapompic hallucinations. Denies a family history of sleep apnea. Reports drowsy driving. Drinks 1 glass of tea daily and 1-2 sodas weekly, denies alcohol, uses a nicotine vape throughout the day, denies illicit drug use.   Bedtime 10 pm-12 am Sleep onset 5-10 mins Rise time 8-9 am   EPWORTH SLEEP SCORE 16    04/12/2024    3:00 PM  Results of the Epworth flowsheet  Sitting and reading 3  Watching TV 2  Sitting, inactive in a public place (e.g. a theatre or a meeting) 2  As a passenger in a car for an hour without a break 3  Lying down to rest in the afternoon when circumstances permit 2  Sitting and talking to someone 1  Sitting quietly after a lunch without alcohol 3  In a car, while stopped for a few minutes in traffic 0  Total score 16    PAST MEDICAL HISTORY :   has a past medical history of Arthritis, Autoimmune disease, Colitis, Depression, and Fibromyalgia.  has a past surgical history that includes Ovary surgery (Bilateral) and Colonoscopy with propofol  (N/A, 11/26/2020). Prior to Admission medications  Medication Sig Start Date End Date Taking? Authorizing Provider  ARIPiprazole  (ABILIFY ) 2 MG tablet Take 1 tablet (2 mg total) by mouth in the morning. 12/27/23 04/12/24 Yes Stephanie Ayala  DULoxetine  (CYMBALTA ) 30 MG capsule Take 1 capsule (30 mg total) by mouth 2 (two)  times daily. No future refills 04/12/24  Yes Stephanie Ayala  levothyroxine (SYNTHROID) 50 MCG tablet Take 1 tablet by mouth daily. 10/01/21 04/12/24 Yes Provider, Historical, Ayala  mesalamine  (APRISO ) 0.375 g 24 hr capsule TAKE 4 CAPSULES (1.5 G TOTAL) BY MOUTH IN THE MORNING AND AT BEDTIME. 04/23/23  Yes Stephanie Ayala  pregabalin (LYRICA) 50 MG capsule  03/01/23  Yes Provider, Historical, Ayala   Allergies[1]  FAMILY HISTORY:  family history includes Alcohol abuse in her maternal grandfather; Anxiety disorder in her mother; Diabetes in her father and paternal uncle; Heart disease in her paternal grandfather and paternal uncle; Hypertension in her father. SOCIAL HISTORY:  reports that she has quit smoking. Her smoking use included cigarettes and e-cigarettes. She has never used smokeless tobacco. She reports that she does not currently use alcohol. She reports that she does not use drugs.   Review of Systems:  Gen:  Denies  fever, sweats, chills weight loss  HEENT: Denies blurred vision, double vision, ear pain, eye pain, hearing loss, nose bleeds, sore throat Cardiac:  No dizziness, chest pain or heaviness, chest tightness,edema, No JVD Resp:   No cough, -sputum production, -shortness of breath,-wheezing, -hemoptysis,  Gi: Denies swallowing difficulty, stomach pain, nausea or vomiting, diarrhea, constipation, bowel incontinence Gu:  Denies bladder incontinence, burning urine Ext:   Denies Joint pain, stiffness or swelling Skin: Denies  skin rash, easy bruising or bleeding or hives Endoc:  Denies polyuria, polydipsia , polyphagia or weight change Psych:  Denies depression, insomnia or hallucinations  Other:  All other systems negative  VITAL SIGNS: BP 126/80   Pulse 84   Temp 99.4 F (37.4 C)   Ht 5' 2 (1.575 m)   Wt 216 lb (98 kg)   LMP 12/31/2023 (Approximate)   SpO2 97%   BMI 39.51 kg/m    Physical Examination:   General Appearance: No distress  EYES PERRLA, EOM  intact.   NECK Supple, No JVD Pulmonary: normal breath sounds, No wheezing.  CardiovascularNormal S1,S2.  No m/r/g.   Abdomen: Benign, Soft, non-tender. Skin:   warm, no rashes, no ecchymosis  Extremities: normal, no cyanosis, clubbing. Neuro:without focal findings,  speech normal  PSYCHIATRIC: Mood, affect within normal limits.   ASSESSMENT AND PLAN  OSA I suspect that OSA is likely present due to clinical presentation. Discussed the consequences of untreated sleep apnea. Advised not to drive drowsy for safety of patient and others. Will complete further evaluation with a home sleep study and follow up to review results.    Bipolar disorder Stable, on current management. Following with PCP.    MEDICATION ADJUSTMENTS/LABS AND TESTS ORDERED: Recommend Sleep Study   Patient  satisfied with Plan of action and management. All questions answered  Follow up to review HST results and treatment plan.   I spent a total of 47 minutes reviewing chart data, face-to-face evaluation with the patient, counseling and coordination of care as detailed above.    Stephanie Ayala, M.D.  Sleep Medicine [facility]           [1] No Known Allergies  "

## 2024-04-12 NOTE — Patient Instructions (Addendum)
 SABRA

## 2024-04-12 NOTE — Telephone Encounter (Signed)
 Will give her a week to get required labs and EKG completed , if not please refer out since I do not want her to wait to get treatment for her condition until she gets health insurance. Will provide community resources to be seen sooner

## 2024-04-13 ENCOUNTER — Other Ambulatory Visit: Payer: Self-pay | Admitting: Family Medicine

## 2024-04-13 ENCOUNTER — Telehealth: Payer: Self-pay

## 2024-04-13 DIAGNOSIS — K51011 Ulcerative (chronic) pancolitis with rectal bleeding: Secondary | ICD-10-CM

## 2024-04-13 MED ORDER — MESALAMINE ER 0.375 G PO CP24: 1500.0000 mg | ORAL_CAPSULE | Freq: Two times a day (BID) | ORAL | 0 refills | Status: AC

## 2024-04-13 NOTE — Progress Notes (Signed)
 1 month supply reordered until patient is seen in clinic.

## 2024-04-13 NOTE — Telephone Encounter (Signed)
 Patient called office stating she called on Monday asking if she could get refill on Mesalamine  until her appointment w/ Grayce later this month- she stated she is out and has been out for several days now.

## 2024-04-14 ENCOUNTER — Ambulatory Visit: Payer: Self-pay | Admitting: Psychiatry

## 2024-04-17 ENCOUNTER — Encounter: Payer: Self-pay | Admitting: Sleep Medicine

## 2024-04-17 DIAGNOSIS — G4733 Obstructive sleep apnea (adult) (pediatric): Secondary | ICD-10-CM

## 2024-04-18 ENCOUNTER — Telehealth: Admitting: Psychiatry

## 2024-04-18 ENCOUNTER — Encounter: Payer: Self-pay | Admitting: Psychiatry

## 2024-04-18 DIAGNOSIS — F411 Generalized anxiety disorder: Secondary | ICD-10-CM

## 2024-04-18 DIAGNOSIS — F439 Reaction to severe stress, unspecified: Secondary | ICD-10-CM | POA: Diagnosis not present

## 2024-04-18 DIAGNOSIS — F3181 Bipolar II disorder: Secondary | ICD-10-CM | POA: Diagnosis not present

## 2024-04-18 DIAGNOSIS — F424 Excoriation (skin-picking) disorder: Secondary | ICD-10-CM | POA: Diagnosis not present

## 2024-04-18 DIAGNOSIS — G4710 Hypersomnia, unspecified: Secondary | ICD-10-CM | POA: Diagnosis not present

## 2024-04-18 MED ORDER — ARIPIPRAZOLE 5 MG PO TABS: 5.0000 mg | ORAL_TABLET | Freq: Every day | ORAL | 1 refills | Status: AC
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
## 2024-04-19 ENCOUNTER — Ambulatory Visit: Admitting: Family Medicine

## 2024-05-03 ENCOUNTER — Ambulatory Visit (INDEPENDENT_AMBULATORY_CARE_PROVIDER_SITE_OTHER)

## 2024-05-03 DIAGNOSIS — F439 Reaction to severe stress, unspecified: Secondary | ICD-10-CM

## 2024-05-03 DIAGNOSIS — F3181 Bipolar II disorder: Secondary | ICD-10-CM | POA: Diagnosis not present

## 2024-05-03 DIAGNOSIS — F411 Generalized anxiety disorder: Secondary | ICD-10-CM
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
## 2024-05-08 ENCOUNTER — Ambulatory Visit

## 2024-05-09 ENCOUNTER — Ambulatory Visit

## 2024-05-12 ENCOUNTER — Telehealth: Admitting: Psychiatry

## 2024-05-18 ENCOUNTER — Ambulatory Visit

## 2024-05-25 ENCOUNTER — Ambulatory Visit

## 2024-05-30 ENCOUNTER — Ambulatory Visit: Payer: Self-pay | Admitting: Psychiatry
